# Patient Record
Sex: Female | Born: 1975 | Marital: Married | Smoking: Never smoker
Health system: Southern US, Community
[De-identification: ages and names within clinical notes are randomized; demographics above are authoritative.]

## PROBLEM LIST (undated history)

## (undated) DIAGNOSIS — R569 Unspecified convulsions: Secondary | ICD-10-CM

## (undated) DIAGNOSIS — E288 Other ovarian dysfunction: Secondary | ICD-10-CM

## (undated) DIAGNOSIS — J45909 Unspecified asthma, uncomplicated: Secondary | ICD-10-CM

## (undated) DIAGNOSIS — Z8619 Personal history of other infectious and parasitic diseases: Secondary | ICD-10-CM

## (undated) DIAGNOSIS — D649 Anemia, unspecified: Secondary | ICD-10-CM

## (undated) DIAGNOSIS — U071 COVID-19: Secondary | ICD-10-CM

## (undated) DIAGNOSIS — E2839 Other primary ovarian failure: Secondary | ICD-10-CM

## (undated) DIAGNOSIS — E785 Hyperlipidemia, unspecified: Secondary | ICD-10-CM

## (undated) DIAGNOSIS — T7840XA Allergy, unspecified, initial encounter: Secondary | ICD-10-CM

## (undated) DIAGNOSIS — F329 Major depressive disorder, single episode, unspecified: Secondary | ICD-10-CM

## (undated) DIAGNOSIS — F32A Depression, unspecified: Secondary | ICD-10-CM

## (undated) DIAGNOSIS — E063 Autoimmune thyroiditis: Secondary | ICD-10-CM

## (undated) DIAGNOSIS — Z87440 Personal history of urinary (tract) infections: Secondary | ICD-10-CM

## (undated) DIAGNOSIS — F988 Other specified behavioral and emotional disorders with onset usually occurring in childhood and adolescence: Secondary | ICD-10-CM

## (undated) HISTORY — DX: Other ovarian dysfunction: E28.8

## (undated) HISTORY — PX: DILATION AND CURETTAGE OF UTERUS: SHX78

## (undated) HISTORY — DX: Other specified behavioral and emotional disorders with onset usually occurring in childhood and adolescence: F98.8

## (undated) HISTORY — PX: MYOMECTOMY: SHX85

## (undated) HISTORY — DX: COVID-19: U07.1

## (undated) HISTORY — DX: Personal history of urinary (tract) infections: Z87.440

## (undated) HISTORY — DX: Unspecified asthma, uncomplicated: J45.909

## (undated) HISTORY — DX: Autoimmune thyroiditis: E06.3

## (undated) HISTORY — DX: Other primary ovarian failure: E28.39

## (undated) HISTORY — DX: Unspecified convulsions: R56.9

## (undated) HISTORY — PX: ABDOMINAL HYSTERECTOMY: SHX81

## (undated) HISTORY — DX: Allergy, unspecified, initial encounter: T78.40XA

## (undated) HISTORY — DX: Personal history of other infectious and parasitic diseases: Z86.19

## (undated) HISTORY — DX: Major depressive disorder, single episode, unspecified: F32.9

## (undated) HISTORY — DX: Hyperlipidemia, unspecified: E78.5

## (undated) HISTORY — DX: Depression, unspecified: F32.A

---

## 2015-12-17 LAB — HM PAP SMEAR

## 2016-05-26 ENCOUNTER — Ambulatory Visit: Payer: Self-pay

## 2016-12-29 DIAGNOSIS — E038 Other specified hypothyroidism: Secondary | ICD-10-CM | POA: Insufficient documentation

## 2017-04-16 ENCOUNTER — Encounter: Payer: Self-pay | Admitting: Obstetrics and Gynecology

## 2017-04-16 ENCOUNTER — Ambulatory Visit (INDEPENDENT_AMBULATORY_CARE_PROVIDER_SITE_OTHER): Payer: Managed Care, Other (non HMO) | Admitting: Obstetrics and Gynecology

## 2017-04-16 VITALS — BP 122/74 | Ht 67.0 in | Wt 154.0 lb

## 2017-04-16 DIAGNOSIS — Z1151 Encounter for screening for human papillomavirus (HPV): Secondary | ICD-10-CM | POA: Diagnosis not present

## 2017-04-16 DIAGNOSIS — N926 Irregular menstruation, unspecified: Secondary | ICD-10-CM | POA: Diagnosis not present

## 2017-04-16 DIAGNOSIS — Z01419 Encounter for gynecological examination (general) (routine) without abnormal findings: Secondary | ICD-10-CM | POA: Diagnosis not present

## 2017-04-16 DIAGNOSIS — Z113 Encounter for screening for infections with a predominantly sexual mode of transmission: Secondary | ICD-10-CM | POA: Diagnosis not present

## 2017-04-16 DIAGNOSIS — Z124 Encounter for screening for malignant neoplasm of cervix: Secondary | ICD-10-CM | POA: Diagnosis not present

## 2017-04-16 LAB — POCT URINE PREGNANCY: PREG TEST UR: NEGATIVE

## 2017-04-16 NOTE — Progress Notes (Signed)
Chief Complaint  Patient presents with  . Annual Exam     HPI:      [name] is a 41 y.o. No obstetric history on file. who LMP was Patient's last menstrual period was 03/09/2017., presents today for her annual examination.  Her menses are regular every 28-30 days, lasting 7 days.  Dysmenorrhea mild, occurring first 1-2 days of flow. She does not have intermenstrual bleeding. She was 1 wk late with her 3/18 menses and is now 10 days late with this menses. She is concerned about menopause because she has never been late before. She has hypothyroid but had euthyroid labs about a month ago. She is under increased stress due to recent unemployment and getting married in 1 mo.  Sex activity: single partner, contraception - condoms most of the time.  Last Pap: 1 1/2 yr ago.  Results were: "abn, due for repeat in a yr". 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   There is a FH of breast cancer in her PGM, genetic testing unknown. There is no FH of ovarian cancer. The patient does do self-breast exams. She had a baseline mammo in [place] age 13. She was found to have an abn place on mammo but had a neg u/s.   Tobacco use: The patient denies current or previous tobacco use. Alcohol use: social drinker Exercise: moderately active  She does get adequate calcium and Vitamin D in her diet.    Past Medical History:  Diagnosis Date  . ADD (attention deficit disorder)   . Depression   . Hashimoto's disease     Past Surgical History:  Procedure Laterality Date  . MYOMECTOMY      Family History  Problem Relation Age of Onset  . Rheum arthritis Mother   . Hashimoto's thyroiditis Mother   . Hypertension Mother   . Diabetes Mellitus II Father   . Hypothyroidism Father   . Hypertension Father   . Coronary artery disease Father   . Stroke Maternal Grandmother   . Breast cancer Paternal Grandmother 81    Social History   Social History  . Marital status: Single    Spouse name: N/A    . Number of children: N/A  . Years of education: N/A   Occupational History  . Not on file.   Social History Main Topics  . Smoking status: Never Smoker  . Smokeless tobacco: Never Used  . Alcohol use No  . Drug use: No  . Sexual activity: Yes    Birth control/ protection: Condom   Other Topics Concern  . Not on file   Social History Narrative  . No narrative on file     Current Outpatient Prescriptions:  .  UNITHROID 125 MCG tablet, TAKE 1 TABLET BEFORE BREAKFAST ON EMPTY STOMACH ONCE A DAY FOR THYROID (DAW), Disp: , Rfl: 0 .  VYVANSE 50 MG capsule, Take 50 mg by mouth daily., Disp: , Rfl: 0  ROS:  Review of Systems  Constitutional: Negative for fatigue, fever and unexpected weight change.  Respiratory: Negative for cough, shortness of breath and wheezing.   Cardiovascular: Negative for chest pain, palpitations and leg swelling.  Gastrointestinal: Positive for constipation. Negative for blood in stool, diarrhea, nausea and vomiting.  Endocrine: Negative for cold intolerance, heat intolerance and polyuria.  Genitourinary: Negative for dyspareunia, dysuria, flank pain, frequency, genital sores, hematuria, menstrual problem, pelvic pain, urgency, vaginal bleeding, vaginal discharge and vaginal pain.  Musculoskeletal: Negative for back pain, joint swelling and  myalgias.  Skin: Negative for rash.  Neurological: Negative for dizziness, syncope, light-headedness, numbness and headaches.  Hematological: Negative for adenopathy.  Psychiatric/Behavioral: Negative for agitation, confusion, sleep disturbance and suicidal ideas. The patient is not nervous/anxious.      Objective: BP 122/74   Ht 5\' 7"  (1.702 m)   Wt 154 lb (69.9 kg)   LMP 03/09/2017   BMI 24.12 kg/m    Physical Exam  Constitutional: She is oriented to person, place, and time. She appears well-developed and well-nourished.  Genitourinary: Vagina normal and uterus normal. There is no rash or tenderness on the  right labia. There is no rash or tenderness on the left labia. No erythema or tenderness in the vagina. No vaginal discharge found. Right adnexum does not display mass and does not display tenderness. Left adnexum does not display mass and does not display tenderness.  Cervix exhibits discharge. Cervix does not exhibit motion tenderness or polyp. Uterus is not enlarged or tender.  Genitourinary Comments: BROWN D/C VAGINALLY  Neck: Normal range of motion. No thyromegaly present.  Cardiovascular: Normal rate, regular rhythm and normal heart sounds.   No murmur heard. Pulmonary/Chest: Effort normal and breath sounds normal. Right breast exhibits no mass, no nipple discharge, no skin change and no tenderness. Left breast exhibits no mass, no nipple discharge, no skin change and no tenderness.  Abdominal: Soft. There is no tenderness. There is no guarding.  Musculoskeletal: Normal range of motion.  Neurological: She is alert and oriented to person, place, and time. No cranial nerve deficit.  Psychiatric: She has a normal mood and affect. Her behavior is normal.  Vitals reviewed.   Results: Results for orders placed or performed in visit on 04/16/17 (from the past 24 hour(s))  POCT urine pregnancy     Status: Normal   Collection Time: 04/16/17 11:34 AM  Result Value Ref Range   Preg Test, Ur Negative Negative    Assessment/Plan: Encounter for annual routine gynecological examination  Cervical cancer screening - Hx of abn paps. Will call pt with results. - Plan: IGP,CtNgTv,Apt HPV,rfx16/18,45  Screening for HPV (human papillomavirus) - Plan: IGP,CtNgTv,Apt HPV,rfx16/18,45  Screening for STD (sexually transmitted disease) - Plan: IGP,CtNgTv,Apt HPV,rfx16/18,45  Late menses - Neg UPT. Question if related to stress since euthyroid. Pt to follow sx and f/u if menses cont to be irreg. Discussed causes of irreg cycles.  - Plan: POCT urine pregnancy             GYN counsel mammography screening,  menopause, adequate intake of calcium and vitamin D     F/U  Return in about 1 year (around 04/16/2018).  Wilma Michaelson B. Freemon Binford, PA-C 04/16/2017 11:34 AM

## 2017-04-20 ENCOUNTER — Telehealth: Payer: Self-pay | Admitting: Obstetrics and Gynecology

## 2017-04-20 LAB — IGP,CTNGTV,APT HPV,RFX16/18,45
CHLAMYDIA, NUC. ACID AMP: NEGATIVE
Gonococcus, Nuc. Acid Amp: NEGATIVE
HPV APTIMA: NEGATIVE
PAP SMEAR COMMENT: 0
Trich vag by NAA: NEGATIVE

## 2017-04-20 NOTE — Telephone Encounter (Signed)
LM with neg pap results. F/u prn.

## 2018-09-02 ENCOUNTER — Encounter: Payer: Self-pay | Admitting: Internal Medicine

## 2018-09-02 ENCOUNTER — Ambulatory Visit (INDEPENDENT_AMBULATORY_CARE_PROVIDER_SITE_OTHER): Payer: BLUE CROSS/BLUE SHIELD | Admitting: Internal Medicine

## 2018-09-02 VITALS — BP 128/90 | HR 101 | Temp 98.4°F | Resp 15 | Ht 67.0 in | Wt 162.5 lb

## 2018-09-02 DIAGNOSIS — E049 Nontoxic goiter, unspecified: Secondary | ICD-10-CM

## 2018-09-02 DIAGNOSIS — J4520 Mild intermittent asthma, uncomplicated: Secondary | ICD-10-CM | POA: Diagnosis not present

## 2018-09-02 DIAGNOSIS — E063 Autoimmune thyroiditis: Secondary | ICD-10-CM | POA: Diagnosis not present

## 2018-09-02 DIAGNOSIS — E2839 Other primary ovarian failure: Secondary | ICD-10-CM | POA: Insufficient documentation

## 2018-09-02 DIAGNOSIS — E559 Vitamin D deficiency, unspecified: Secondary | ICD-10-CM

## 2018-09-02 DIAGNOSIS — Z1389 Encounter for screening for other disorder: Secondary | ICD-10-CM

## 2018-09-02 DIAGNOSIS — E039 Hypothyroidism, unspecified: Secondary | ICD-10-CM | POA: Insufficient documentation

## 2018-09-02 DIAGNOSIS — Z23 Encounter for immunization: Secondary | ICD-10-CM

## 2018-09-02 DIAGNOSIS — M79645 Pain in left finger(s): Secondary | ICD-10-CM | POA: Insufficient documentation

## 2018-09-02 DIAGNOSIS — J45909 Unspecified asthma, uncomplicated: Secondary | ICD-10-CM | POA: Insufficient documentation

## 2018-09-02 DIAGNOSIS — F419 Anxiety disorder, unspecified: Secondary | ICD-10-CM

## 2018-09-02 DIAGNOSIS — E785 Hyperlipidemia, unspecified: Secondary | ICD-10-CM | POA: Insufficient documentation

## 2018-09-02 DIAGNOSIS — T7840XA Allergy, unspecified, initial encounter: Secondary | ICD-10-CM | POA: Insufficient documentation

## 2018-09-02 DIAGNOSIS — F329 Major depressive disorder, single episode, unspecified: Secondary | ICD-10-CM

## 2018-09-02 DIAGNOSIS — F32A Depression, unspecified: Secondary | ICD-10-CM | POA: Insufficient documentation

## 2018-09-02 DIAGNOSIS — T7840XD Allergy, unspecified, subsequent encounter: Secondary | ICD-10-CM

## 2018-09-02 DIAGNOSIS — Z1231 Encounter for screening mammogram for malignant neoplasm of breast: Secondary | ICD-10-CM | POA: Diagnosis not present

## 2018-09-02 DIAGNOSIS — Z0000 Encounter for general adult medical examination without abnormal findings: Secondary | ICD-10-CM

## 2018-09-02 DIAGNOSIS — Z1159 Encounter for screening for other viral diseases: Secondary | ICD-10-CM

## 2018-09-02 DIAGNOSIS — E663 Overweight: Secondary | ICD-10-CM

## 2018-09-02 DIAGNOSIS — F909 Attention-deficit hyperactivity disorder, unspecified type: Secondary | ICD-10-CM

## 2018-09-02 DIAGNOSIS — Z0184 Encounter for antibody response examination: Secondary | ICD-10-CM

## 2018-09-02 DIAGNOSIS — E288 Other ovarian dysfunction: Secondary | ICD-10-CM

## 2018-09-02 HISTORY — DX: Hyperlipidemia, unspecified: E78.5

## 2018-09-02 HISTORY — DX: Pain in left finger(s): M79.645

## 2018-09-02 HISTORY — DX: Anxiety disorder, unspecified: F41.9

## 2018-09-02 HISTORY — DX: Depression, unspecified: F32.A

## 2018-09-02 MED ORDER — MONTELUKAST SODIUM 10 MG PO TABS: 10.0000 mg | ORAL_TABLET | Freq: Every day | ORAL | 3 refills | Status: DC

## 2018-09-02 NOTE — Patient Instructions (Addendum)
Regular zyrtec  Increase sleep 7-8 hours at night  Use cool cloth to eyes   Tdap/DTaP Vaccine (Diphtheria, Tetanus, and Pertussis): What You Need to Know 1. Why get vaccinated? Diphtheria, tetanus, and pertussis are serious diseases caused by bacteria. Diphtheria and pertussis are spread from person to person. Tetanus enters the body through cuts or wounds. DIPHTHERIA causes a thick covering in the back of the throat.  It can lead to breathing problems, paralysis, heart failure, and even death.  TETANUS (Lockjaw) causes painful tightening of the muscles, usually all over the body.  It can lead to "locking" of the jaw so the victim cannot open his mouth or swallow. Tetanus leads to death in up to 2 out of 10 cases.  PERTUSSIS (Whooping Cough) causes coughing spells so bad that it is hard for infants to eat, drink, or breathe. These spells can last for weeks.  It can lead to pneumonia, seizures (jerking and staring spells), brain damage, and death.  Diphtheria, tetanus, and pertussis vaccine (DTaP) can help prevent these diseases. Most children who are vaccinated with DTaP will be protected throughout childhood. Many more children would get these diseases if we stopped vaccinating. DTaP is a safer version of an older vaccine called DTP. DTP is no longer used in the [place]. 2. Who should get DTaP vaccine and when? Children should get 5 doses of DTaP vaccine, one dose at each of the following ages:  2 months  4 months  6 months  15-18 months  4-6 years  DTaP may be given at the same time as other vaccines. 3. Some children should not get DTaP vaccine or should wait  Children with minor illnesses, such as a cold, may be vaccinated. But children who are moderately or severely ill should usually wait until they recover before getting DTaP vaccine.  Any child who had a life-threatening allergic reaction after a dose of DTaP should not get another dose.  Any child who suffered  a brain or nervous system disease within 7 days after a dose of DTaP should not get another dose.  Talk with your doctor if your child: ? had a seizure or collapsed after a dose of DTaP, ? cried non-stop for 3 hours or more after a dose of DTaP, ? had a fever over 105F after a dose of DTaP. Ask your doctor for more information. Some of these children should not get another dose of pertussis vaccine, but may get a vaccine without pertussis, called DT. 4. Older children and adults DTaP is not licensed for adolescents, adults, or children 41 years of age and older. But older people still need protection. A vaccine called Tdap is similar to DTaP. A single dose of Tdap is recommended for people 11 through 40 years of age. Another vaccine, called Td, protects against tetanus and diphtheria, but not pertussis. It is recommended every 10 years. There are separate Vaccine Information Statements for these vaccines. 5. What are the risks from DTaP vaccine? Getting diphtheria, tetanus, or pertussis disease is much riskier than getting DTaP vaccine. However, a vaccine, like any medicine, is capable of causing serious problems, such as severe allergic reactions. The risk of DTaP vaccine causing serious harm, or death, is extremely small. Mild problems (common)  Fever (up to about 1 child in 4)  Redness or swelling where the shot was given (up to about 1 child in 4)  Soreness or tenderness where the shot was given (up to about 1 child in 4)  These problems occur more often after the 4th and 5th doses of the DTaP series than after earlier doses. Sometimes the 4th or 5th dose of DTaP vaccine is followed by swelling of the entire arm or leg in which the shot was given, lasting 1-7 days (up to about 1 child in 41). Other mild problems include:  Fussiness (up to about 1 child in 3)  Tiredness or poor appetite (up to about 1 child in 10)  Vomiting (up to about 1 child in 11) These problems generally occur 1-3  days after the shot. Moderate problems (uncommon)  Seizure (jerking or staring) (about 1 child out of 14,000)  Non-stop crying, for 3 hours or more (up to about 1 child out of 1,000)  High fever, over 105F (about 1 child out of 16,000) Severe problems (very rare)  Serious allergic reaction (less than 1 out of a million doses)  Several other severe problems have been reported after DTaP vaccine. These include: ? Long-term seizures, coma, or lowered consciousness ? Permanent brain damage. These are so rare it is hard to tell if they are caused by the vaccine. Controlling fever is especially important for children who have had seizures, for any reason. It is also important if another family member has had seizures. You can reduce fever and pain by giving your child an aspirin-free pain reliever when the shot is given, and for the next 24 hours, following the package instructions. 6. What if there is a serious reaction? What should I look for? Look for anything that concerns you, such as signs of a severe allergic reaction, very high fever, or behavior changes. Signs of a severe allergic reaction can include hives, swelling of the face and throat, difficulty breathing, a fast heartbeat, dizziness, and weakness. These would start a few minutes to a few hours after the vaccination. What should I do?  If you think it is a severe allergic reaction or other emergency that can't wait, call 9-1-1 or get the person to the nearest hospital. Otherwise, call your doctor.  Afterward, the reaction should be reported to the Vaccine Adverse Event Reporting System (VAERS). Your doctor might file this report, or you can do it yourself through the VAERS web site at www.vaers.SamedayNews.es, or by calling 253-216-6380. ? VAERS is only for reporting reactions. They do not give medical advice. 7. The National Vaccine Injury Compensation Program The [employer] Vaccine Injury Compensation Program (VICP) is a federal  program that was created to compensate people who may have been injured by certain vaccines. Persons who believe they may have been injured by a vaccine can learn about the program and about filing a claim by calling 769 141 7233 or visiting the [place] website at GoldCloset.com.ee. 8. How can I learn more?  Ask your doctor.  Call your local or state health department.  Contact the Centers for Disease Control and Prevention (CDC): ? Call (506)480-7057 (1-800-CDC-INFO) or ? Visit CDC's website at http://hunter.com/ CDC DTaP Vaccine (Diphtheria, Tetanus, and Pertussis) VIS (03/26/06) This information is not intended to replace advice given to you by your health care provider. Make sure you discuss any questions you have with your health care provider. Document Released: 08/24/2006 Document Revised: 07/17/2016 Document Reviewed: 07/17/2016 Elsevier Interactive Patient Education  2017 [place] A goiter is an enlarged thyroid gland. The thyroid gland is located in the lower front of the neck. The gland produces hormones that regulate mood, body temperature, pulse rate, and digestion. Most goiters are painless and  are not a cause for serious concern. Goiters and conditions that cause goiters can be treated, if necessary. What are the causes? Causes of this condition include:  Diseases that attack healthy cells in your body (autoimmune diseases) and affect your thyroid function, such as: ? Graves disease. This causes too much thyroid hormone to be produced and it makes your thyroid overly active (hyperthyroidism). ? Hashimoto disease. This type of inflammation of the thyroid (thyroiditis) causes too little thyroid hormone to be produced and it makes your thyroid not active enough (hypothyroidism).  Other conditions that cause thyroiditis.  Nodular goiter. This means that there are one or more small growths on your thyroid. These can create too much thyroid  hormone.  Pregnancy.  Thyroid cancer. This is rare.  Certain medicines.  Radiation exposure.  Iodine deficiency.  In some cases, the cause may not be known (idiopathic). What increases the risk? This condition is more likely to develop in:  People who have a family history of goiter.  Women.  People who do not get enough iodine in their diet.  People who are older than 61.  People who smoke tobacco.  What are the signs or symptoms? Common symptoms of this condition include:  Swelling in the lower part of the neck. This swelling can range from a very small bump to a large lump.  A tight feeling in the throat.  A hoarse voice.  Other symptoms include:  Coughing.  Wheezing.  Difficulty swallowing.  Difficulty breathing.  Bulging neck veins.  Dizziness.  In some cases, there are no symptoms and thyroid hormone levels may be normal. When a goiter is the result of hyperthyroidism, symptoms may also include:  Nervousness or restlessness.  Inability to tolerate heat.  Unexplained weight loss.  Diarrhea.  Change in the texture of hair or skin.  Changes in heart beat, such as skipped beats, extra beats, or a rapid heart rate.  Loss of menstruation.  Shaky hands.  Increased appetite.  Sleep problems.  When a goiter is the result of hypothyroidism, symptoms may also include:  Feeling like you have no energy (lethargy).  Inability to tolerate cold.  Weight gain that is not explained by a change in diet or exercise habits.  Dry skin.  Coarse hair.  Menstrual irregularity.  Constipation.  Sadness or depression.  How is this diagnosed? This condition may be diagnosed with a medical history and physical exam. You may also have other tests, including:  Blood tests to check thyroid function.  Imaging tests, such as: ? Ultrasonography. ? CT scan. ? MRI. ? Thyroid scan. You will be given a safe radioactive injection, then images will be  taken of your thyroid.  Tissue sample (biopsy) of the goiter or any nodules. This checks to see if the goiter or nodules are cancerous.  How is this treated? Treatment for this condition depends on the cause. Treatment may include:  Medicines to control your thyroid.  Anti-inflammatory or steroid medicines, if inflammation is the cause.  Iodine supplements or changes in diet, if the goiter is caused by iodine deficiency.  Radiation therapy.  Surgery to remove your thyroid.  In some cases, no treatment is necessary, and your health care provider will monitor your condition at regular checkups. Follow these instructions at home:  Follow recommendations from your health care provider for any changes to your diet.  Take over-the-counter and prescription medicines only as told by your health care provider.  Do not use any tobacco products, including  cigarettes, chewing tobacco, or e-cigarettes. If you need help quitting, ask your health care provider.  Keep all follow-up appointments as told by your health care provider. This is important. Contact a health care provider if:  Your symptoms do not get better with treatment. Get help right away if:  You develop sudden, unexplained confusion or other mental changes.  You have nausea, vomiting, or diarrhea.  You develop a fever.  Your skin or the whites of your eyes appear yellow (jaundice).  You develop chest pain.  You have trouble breathing or swallowing.  You suddenly become very weak.  You experience extreme restlessness. This information is not intended to replace advice given to you by your health care provider. Make sure you discuss any questions you have with your health care provider. Document Released: 04/16/2010 Document Revised: 05/16/2016 Document Reviewed: 10/23/2014 Elsevier Interactive Patient Education  2018 [employer].  Insomnia Insomnia is a sleep disorder that makes it difficult to fall asleep or to  stay asleep. Insomnia can cause tiredness (fatigue), low energy, difficulty concentrating, mood swings, and poor performance at work or school. There are three different ways to classify insomnia:  Difficulty falling asleep.  Difficulty staying asleep.  Waking up too early in the morning.  Any type of insomnia can be long-term (chronic) or short-term (acute). Both are common. Short-term insomnia usually lasts for three months or less. Chronic insomnia occurs at least three times a week for longer than three months. What are the causes? Insomnia may be caused by another condition, situation, or substance, such as:  Anxiety.  Certain medicines.  Gastroesophageal reflux disease (GERD) or other gastrointestinal conditions.  Asthma or other breathing conditions.  Restless legs syndrome, sleep apnea, or other sleep disorders.  Chronic pain.  Menopause. This may include hot flashes.  Stroke.  Abuse of alcohol, tobacco, or illegal drugs.  Depression.  Caffeine.  Neurological disorders, such as Alzheimer disease.  An overactive thyroid (hyperthyroidism).  The cause of insomnia may not be known. What increases the risk? Risk factors for insomnia include:  Gender. Women are more commonly affected than men.  Age. Insomnia is more common as you get older.  Stress. This may involve your professional or personal life.  Income. Insomnia is more common in people with lower income.  Lack of exercise.  Irregular work schedule or night shifts.  Traveling between different time zones.  What are the signs or symptoms? If you have insomnia, trouble falling asleep or trouble staying asleep is the main symptom. This may lead to other symptoms, such as:  Feeling fatigued.  Feeling nervous about going to sleep.  Not feeling rested in the morning.  Having trouble concentrating.  Feeling irritable, anxious, or depressed.  How is this treated? Treatment for insomnia depends  on the cause. If your insomnia is caused by an underlying condition, treatment will focus on addressing the condition. Treatment may also include:  Medicines to help you sleep.  Counseling or therapy.  Lifestyle adjustments.  Follow these instructions at home:  Take medicines only as directed by your health care provider.  Keep regular sleeping and waking hours. Avoid naps.  Keep a sleep diary to help you and your health care provider figure out what could be causing your insomnia. Include: ? When you sleep. ? When you wake up during the night. ? How well you sleep. ? How rested you feel the next day. ? Any side effects of medicines you are taking. ? What you eat and drink.  Make your bedroom a comfortable place where it is easy to fall asleep: ? Put up shades or special blackout curtains to block light from outside. ? Use a white noise machine to block noise. ? Keep the temperature cool.  Exercise regularly as directed by your health care provider. Avoid exercising right before bedtime.  Use relaxation techniques to manage stress. Ask your health care provider to suggest some techniques that may work well for you. These may include: ? Breathing exercises. ? Routines to release muscle tension. ? Visualizing peaceful scenes.  Cut back on alcohol, caffeinated beverages, and cigarettes, especially close to bedtime. These can disrupt your sleep.  Do not overeat or eat spicy foods right before bedtime. This can lead to digestive discomfort that can make it hard for you to sleep.  Limit screen use before bedtime. This includes: ? Watching TV. ? Using your smartphone, tablet, and computer.  Stick to a routine. This can help you fall asleep faster. Try to do a quiet activity, brush your teeth, and go to bed at the same time each night.  Get out of bed if you are still awake after 15 minutes of trying to sleep. Keep the lights down, but try reading or doing a quiet activity. When  you feel sleepy, go back to bed.  Make sure that you drive carefully. Avoid driving if you feel very sleepy.  Keep all follow-up appointments as directed by your health care provider. This is important. Contact a health care provider if:  You are tired throughout the day or have trouble in your daily routine due to sleepiness.  You continue to have sleep problems or your sleep problems get worse. Get help right away if:  You have serious thoughts about hurting yourself or someone else. This information is not intended to replace advice given to you by your health care provider. Make sure you discuss any questions you have with your health care provider. Document Released: 10/24/2000 Document Revised: 03/28/2016 Document Reviewed: 07/28/2014 Elsevier Interactive Patient Education  [employer].

## 2018-09-06 ENCOUNTER — Telehealth: Payer: Self-pay | Admitting: Radiology

## 2018-09-06 NOTE — Telephone Encounter (Signed)
Pt coming in for labs tomorrow, please place future orders. Thank you.  

## 2018-09-07 ENCOUNTER — Encounter: Payer: Self-pay | Admitting: Internal Medicine

## 2018-09-07 ENCOUNTER — Other Ambulatory Visit (INDEPENDENT_AMBULATORY_CARE_PROVIDER_SITE_OTHER): Payer: BLUE CROSS/BLUE SHIELD

## 2018-09-07 DIAGNOSIS — E559 Vitamin D deficiency, unspecified: Secondary | ICD-10-CM

## 2018-09-07 DIAGNOSIS — F419 Anxiety disorder, unspecified: Secondary | ICD-10-CM

## 2018-09-07 DIAGNOSIS — E049 Nontoxic goiter, unspecified: Secondary | ICD-10-CM | POA: Diagnosis not present

## 2018-09-07 DIAGNOSIS — F329 Major depressive disorder, single episode, unspecified: Secondary | ICD-10-CM

## 2018-09-07 DIAGNOSIS — E785 Hyperlipidemia, unspecified: Secondary | ICD-10-CM | POA: Diagnosis not present

## 2018-09-07 DIAGNOSIS — Z1159 Encounter for screening for other viral diseases: Secondary | ICD-10-CM

## 2018-09-07 DIAGNOSIS — E063 Autoimmune thyroiditis: Secondary | ICD-10-CM

## 2018-09-07 DIAGNOSIS — Z0000 Encounter for general adult medical examination without abnormal findings: Secondary | ICD-10-CM | POA: Diagnosis not present

## 2018-09-07 DIAGNOSIS — Z0184 Encounter for antibody response examination: Secondary | ICD-10-CM

## 2018-09-07 DIAGNOSIS — F32A Depression, unspecified: Secondary | ICD-10-CM

## 2018-09-07 DIAGNOSIS — Z1389 Encounter for screening for other disorder: Secondary | ICD-10-CM

## 2018-09-07 LAB — CBC WITH DIFFERENTIAL/PLATELET
Basophils Absolute: 0 10*3/uL (ref 0.0–0.1)
Basophils Relative: 0.8 % (ref 0.0–3.0)
EOS PCT: 3.1 % (ref 0.0–5.0)
Eosinophils Absolute: 0.1 10*3/uL (ref 0.0–0.7)
HCT: 37.7 % (ref 36.0–46.0)
HEMOGLOBIN: 13 g/dL (ref 12.0–15.0)
Lymphocytes Relative: 26 % (ref 12.0–46.0)
Lymphs Abs: 1.1 10*3/uL (ref 0.7–4.0)
MCHC: 34.5 g/dL (ref 30.0–36.0)
MCV: 90.2 fl (ref 78.0–100.0)
MONO ABS: 0.4 10*3/uL (ref 0.1–1.0)
Monocytes Relative: 9.5 % (ref 3.0–12.0)
Neutro Abs: 2.5 10*3/uL (ref 1.4–7.7)
Neutrophils Relative %: 60.6 % (ref 43.0–77.0)
Platelets: 390 10*3/uL (ref 150.0–400.0)
RBC: 4.18 Mil/uL (ref 3.87–5.11)
RDW: 13.3 % (ref 11.5–15.5)
WBC: 4.2 10*3/uL (ref 4.0–10.5)

## 2018-09-07 LAB — VITAMIN D 25 HYDROXY (VIT D DEFICIENCY, FRACTURES): VITD: 39.17 ng/mL (ref 30.00–100.00)

## 2018-09-07 LAB — COMPREHENSIVE METABOLIC PANEL
ALT: 13 U/L (ref 0–35)
AST: 16 U/L (ref 0–37)
Albumin: 4.3 g/dL (ref 3.5–5.2)
Alkaline Phosphatase: 41 U/L (ref 39–117)
BILIRUBIN TOTAL: 0.5 mg/dL (ref 0.2–1.2)
BUN: 14 mg/dL (ref 6–23)
CO2: 30 meq/L (ref 19–32)
CREATININE: 0.88 mg/dL (ref 0.40–1.20)
Calcium: 9 mg/dL (ref 8.4–10.5)
Chloride: 102 mEq/L (ref 96–112)
GFR: 75.43 mL/min (ref >60.00)
GLUCOSE: 89 mg/dL (ref 70–99)
Potassium: 4 mEq/L (ref 3.5–5.1)
SODIUM: 137 meq/L (ref 135–145)
TOTAL PROTEIN: 6.7 g/dL (ref 6.0–8.3)

## 2018-09-07 LAB — LIPID PANEL
Cholesterol: 202 mg/dL — ABNORMAL HIGH (ref 0–200)
HDL: 50.6 mg/dL (ref >39.00)
LDL Cholesterol: 132 mg/dL — ABNORMAL HIGH (ref 0–99)
NONHDL: 151.41
Total CHOL/HDL Ratio: 4
Triglycerides: 96 mg/dL (ref 0.0–149.0)
VLDL: 19.2 mg/dL (ref 0.0–40.0)

## 2018-09-07 LAB — TSH: TSH: 0.8 u[IU]/mL (ref 0.35–4.50)

## 2018-09-07 LAB — T4, FREE: Free T4: 1 ng/dL (ref 0.60–1.60)

## 2018-09-07 LAB — T3, FREE: T3 FREE: 3.3 pg/mL (ref 2.3–4.2)

## 2018-09-07 NOTE — Addendum Note (Signed)
Addended by: Leeanne Rio on: 09/07/2018 08:59 AM   Modules accepted: Orders

## 2018-09-07 NOTE — Progress Notes (Addendum)
Chief Complaint  Patient presents with  . Establish Care   New patient  1. C/o left thumb pain c/w dequervins tenosynovitis seeing [facility]  2. ADHD, anxiety and depression she is tearful today work is stressful she does not feel her ADHD meds are working she is on prozac 40 mg qd, vyvanse 40 mg qd Dextraoamphetamine ER (10 mg) taking 2 in am and 1-2 pills in pm so 20-40 mg qd f/u Dr. Harlene Ramus in [place] next appt 09/20/18. 3. Asthma newly dx'ed 05/28/18 has prn albuterol inhaler  4. C/o weight gain and wants to get her thyroid checked as she feels she has goiter and would like referral to [facility] endocrine. On adipex 37.5 x few weeks by Dr. Toy Care psychiatry  5. Abnormal menses having cycles irregularly Dr. Kerin Perna is f/u [facility] was 29.   6. H/o HLD  7. Intermittently elevated blood pressure disc avoid Zyrtec D  8. She would like flu shot today    Review of Systems  Constitutional: Negative for weight loss.  HENT: Negative for hearing loss.   Eyes: Negative for blurred vision.  Respiratory: Negative for shortness of breath.   Cardiovascular: Negative for chest pain.  Gastrointestinal: Negative for abdominal pain.  Musculoskeletal: Positive for joint pain.  Skin: Negative for rash.  Neurological: Negative for headaches.  Psychiatric/Behavioral: Positive for depression. The patient is nervous/anxious.        ADHD uncontrolled     Past Medical History:  Diagnosis Date  . ADD (attention deficit disorder)   . Depression   . Hashimoto's disease    Past Surgical History:  Procedure Laterality Date  . MYOMECTOMY     Family History  Problem Relation Age of Onset  . Rheum arthritis Mother   . Hashimoto's thyroiditis Mother   . Hypertension Mother   . Diabetes Mellitus II Father   . Hypothyroidism Father   . Hypertension Father   . Coronary artery disease Father   . Stroke Maternal Grandmother   . Breast cancer Paternal Grandmother 60   Social History   Socioeconomic History  .  Marital status: Single    Spouse name: Not on file  . Number of children: Not on file  . Years of education: Not on file  . Highest education level: Not on file  Occupational History  . Not on file  Social Needs  . Financial resource strain: Not on file  . Food insecurity:    Worry: Not on file    Inability: Not on file  . Transportation needs:    Medical: Not on file    Non-medical: Not on file  Tobacco Use  . Smoking status: Never Smoker  . Smokeless tobacco: Never Used  Substance and Sexual Activity  . Alcohol use: No  . Drug use: No  . Sexual activity: Yes    Birth control/protection: Condom  Lifestyle  . Physical activity:    Days per week: Not on file    Minutes per session: Not on file  . Stress: Not on file  Relationships  . Social connections:    Talks on phone: Not on file    Gets together: Not on file    Attends religious service: Not on file    Active member of club or organization: Not on file    Attends meetings of clubs or organizations: Not on file    Relationship status: Not on file  . Intimate partner violence:    Fear of current or ex partner: Not on  file    Emotionally abused: Not on file    Physically abused: Not on file    Forced sexual activity: Not on file  Other Topics Concern  . Not on file  Social History Narrative  . Not on file   Current Meds  Medication Sig  . albuterol (PROVENTIL HFA;VENTOLIN HFA) 108 (90 Base) MCG/ACT inhaler   . azelastine (ASTELIN) 0.1 % nasal spray TWO SPRAYS TWICE A DAY AS NEEDED FOR ALLERGIES  . cetirizine-pseudoephedrine (ZYRTEC-D) 5-120 MG tablet Take 1 tablet by mouth daily.  Marland Kitchen estradiol (VIVELLE-DOT) 0.1 MG/24HR patch APPLY TO SKIN AND CHANGE TWICE A WEEK  . FLUoxetine (PROZAC) 40 MG capsule Take 1 tablet once a day  . phentermine 37.5 MG capsule Take by mouth daily.  Marland Kitchen UNITHROID 125 MCG tablet TAKE 1 TABLET BEFORE BREAKFAST ON EMPTY STOMACH ONCE A DAY FOR THYROID (DAW)  . VYVANSE 50 MG capsule Take 50 mg  by mouth daily.   Allergies  Allergen Reactions  . Wellbutrin [Bupropion]     Seizures    . Latex Other (See Comments)    Itching, sneezing  . Other Other (See Comments)    unknown   No results found for this or any previous visit (from the past 2160 hour(s)). Objective  Body mass index is 25.45 kg/m. Wt Readings from Last 3 Encounters:  09/02/18 162 lb 8 oz (73.7 kg)  04/16/17 154 lb (69.9 kg)   Temp Readings from Last 3 Encounters:  09/02/18 98.4 F (36.9 C) (Oral)   BP Readings from Last 3 Encounters:  09/02/18 128/90  04/16/17 122/74   Pulse Readings from Last 3 Encounters:  09/02/18 (!) 101    Physical Exam  Constitutional: She is oriented to person, place, and time. Vital signs are normal. She appears well-developed and well-nourished. She is cooperative.  HENT:  Head: Normocephalic and atraumatic.  Mouth/Throat: Oropharynx is clear and moist and mucous membranes are normal.  Eyes: Pupils are equal, round, and reactive to light. Conjunctivae are normal.  Cardiovascular: Normal rate, regular rhythm and normal heart sounds.  Pulmonary/Chest: Effort normal and breath sounds normal.  Neurological: She is alert and oriented to person, place, and time. Gait normal.  Skin: Skin is warm, dry and intact.  Psychiatric: She has a normal mood and affect. Her speech is normal and behavior is normal. Judgment and thought content normal. Cognition and memory are normal.  Nursing note and vitals reviewed.   Assessment   1. Asthma and allergies  2. ADHD, anxiety and depression  3. HLD  4. H/o elevated blood pressure  5. L thumb pain  6. 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 Overweight BMI 25.45  8. hashimotos thyroiditis c/w goiter  9.HM Plan   1. rec stop zyrtec D and take regular formulation  Prn Albuterol inhaler add singulair  2.  F/u with psych Dr. Toy Care 09/20/18  3.  Check fasting labs upcoming  4. Monitor BP  5. F[facility]u [facility]  6. F/u with ob/gyn and  Dr. Kerin Perna  7. On adipex 37.5 per Dr. Toy Care  8.  Referred to [facility] endocrine  US thyroid  9.  Given flu shot today and Tdap  Referred mammogram  -prior mammogram 10/03/13 normal  [facility] [place]  Never smoker   Right thyroid FNA 08/26/50 benign follicular cells, rare oncocytic metaplastic cells and histiocytes consistent with MNG (benign thyriod nodules) neg malignancy. Thyroid US 09/12/13 thyroiditis, solitary nodule right  thyroid increased in size   Provider: Dr. Olivia Mackie McLean-Scoc[facility]

## 2018-09-08 ENCOUNTER — Other Ambulatory Visit: Payer: BLUE CROSS/BLUE SHIELD

## 2018-09-08 ENCOUNTER — Encounter: Payer: Self-pay | Admitting: Internal Medicine

## 2018-09-08 DIAGNOSIS — Z1389 Encounter for screening for other disorder: Secondary | ICD-10-CM

## 2018-09-08 LAB — HEPATITIS B SURFACE ANTIBODY, QUANTITATIVE: Hepatitis B-Post: >1000 m[IU]/mL (ref >=10)

## 2018-09-08 LAB — MEASLES/MUMPS/RUBELLA IMMUNITY
Mumps IgG: 195 AU/mL
RUBELLA: 17 {index}
Rubeola IgG: >300 AU/mL

## 2018-09-09 LAB — URINALYSIS, ROUTINE W REFLEX MICROSCOPIC
Bilirubin, UA: NEGATIVE
GLUCOSE, UA: NEGATIVE
KETONES UA: NEGATIVE
Leukocytes, UA: NEGATIVE
NITRITE UA: NEGATIVE
Protein, UA: NEGATIVE
RBC, UA: NEGATIVE
SPEC GRAV UA: 1.008 (ref 1.005–1.030)
Urobilinogen, Ur: 0.2 mg/dL (ref 0.2–1.0)
pH, UA: 8 — ABNORMAL HIGH (ref 5.0–7.5)

## 2018-09-16 ENCOUNTER — Ambulatory Visit
Admission: RE | Admit: 2018-09-16 | Discharge: 2018-09-16 | Disposition: A | Discharge status: Home / Self Care | Payer: BLUE CROSS/BLUE SHIELD | Source: Ambulatory Visit | Attending: Internal Medicine | Admitting: Internal Medicine

## 2018-09-16 DIAGNOSIS — E049 Nontoxic goiter, unspecified: Secondary | ICD-10-CM

## 2018-09-16 DIAGNOSIS — E041 Nontoxic single thyroid nodule: Secondary | ICD-10-CM | POA: Insufficient documentation

## 2018-10-11 ENCOUNTER — Other Ambulatory Visit: Payer: Self-pay | Admitting: Obstetrics and Gynecology

## 2018-10-11 DIAGNOSIS — R1900 Intra-abdominal and pelvic swelling, mass and lump, unspecified site: Secondary | ICD-10-CM

## 2018-10-24 ENCOUNTER — Ambulatory Visit
Admission: RE | Admit: 2018-10-24 | Discharge: 2018-10-24 | Disposition: A | Discharge status: Home / Self Care | Payer: BLUE CROSS/BLUE SHIELD | Source: Ambulatory Visit | Attending: Obstetrics and Gynecology | Admitting: Obstetrics and Gynecology

## 2018-10-24 ENCOUNTER — Encounter: Payer: Self-pay | Admitting: Internal Medicine

## 2018-10-24 DIAGNOSIS — R1900 Intra-abdominal and pelvic swelling, mass and lump, unspecified site: Secondary | ICD-10-CM

## 2018-10-24 MED ORDER — GADOBENATE DIMEGLUMINE 529 MG/ML IV SOLN
15.0000 mL | Freq: Once | INTRAVENOUS | Status: AC | PRN
  Administered 2018-10-24: 15 mL via INTRAVENOUS

## 2018-10-26 ENCOUNTER — Telehealth: Payer: Self-pay | Admitting: *Deleted

## 2018-10-26 NOTE — Telephone Encounter (Signed)
Patient called back and is scheduled for 12/19

## 2018-10-26 NOTE — Telephone Encounter (Signed)
Called and left the patient a message to call the office back. Need to scheduled an appt for her on December 19th

## 2018-10-27 NOTE — Progress Notes (Signed)
Consult Note: Gyn-Onc  Consult was requested by Dr.Leger for the evaluation of Cynthia Burke 42 y.o. female  CC: Uterine mass, abnormal uterine bleeding  Assessment/Plan:  [name] is a 42 y.o.  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  D/D includes degenerating myoma or other neoplasia not limited to leiomyosarcoma.  Earlier this year a 3 cm degenerating mass was appreciated in the uterus and normal pelvic exams were documented until March.  At the visit today there is a mass that is fixed within the pelvis approximately 14 cm in widest dimension.  This is minimally tender.  Infrequent bleeding was noted within the vagina, no masses were palpated within the cervix.  On MRI and ultrasound imaging both adnexa appear within normal limits  Discussed with Ms Cynthia Burke and her husband that the differential diagnosis includes leiomyosarcoma.  Other possibilities are a rapidly expanding the last cystic degenerating months, or other neoplastic conditions.  Plan is for total abdominal hysterectomy bilateral salpingectomy, possible oophorectomy or lymph node dissection dependent upon the operative findings.  Nodularity is not palpated on the rectosigmoid, no adenopathy is appreciated on pelvic or radiallogic evaluation. The risks of the procedure discussed were inclusive of infection bleeding damage to surrounding structures prolonged hospitalization or reoperation.  Given the heavy vaginal bleeding, a CBC will be collected today and I discussed the strong possibility for transfusion depending on her pre-or intraoperative hematocrit.  I have also ordered a chest x-ray.  Cynthia Burke is aware that the surgeon will be Dr. Precious Haws.  Cynthia Burke will meet Dr. Gerarda Fraction prior to surgery.  HPI: [name] is a 42 y.o. gravida 0 with early ovarian failure.  Her history is notable for remote history of hysteroscopic  polypectomy with no evidence of malignancy or precancerous changes.  She is also notable for an ultrasound in early 2019 that was notable for 3 cm posterior fibroid that appeared to be degenerating.  At that visit she was informed that her ultrasound otherwise was within normal limits.  Her history is also notable for a normal pelvic examination in March 2019.  She presented in November 2019 with abnormal uterine bleeding and endometrial biopsy was attempted but could not be performed because of the uterine mass.  An ultrasound was obtained and was notable for a uterus measuring 14.2 cm in greatest dimension.  Within this there was a 11 x 10 x 11 complex cystic mass with internal echoes and thin and thick septations that appeared to be in the posterior myometrium of the uterus.  No blood flow was seen within these masses.  The adnexa were evaluated and were normal, notable was the presence of bilateral follicular cysts .  A Ca1 25 collected on October 11, 2018 returned a value of 17.  An inhibin a was 46 which is within normal limits.  MRI  December 2019 Reproductive:  -- Uterus: Measures 14.6 x 10.4 by 11.6 cm (volume = 922 cm^3). A complex cystic lesion is seen in the right posterior uterine wall, which displaces the endometrium anteriorly and to the left. This measures 11.2 x 9.6 x 10.5 cm, and shows multiple internal irregular enhancing septations. This lesion also has smooth, well-defined wall, without evidence of invasion of adjacent structures. No abnormal endometrial thickening seen. Cervix and vagina are normal appearance.   -- Right ovary: 2.4 cm simple follicular cyst noted. No adnexal mass identified.  IMPRESSION: 11 cm complex cystic mass in posterior uterine wall, which displaces  the endometrium anteriorly and to the left. Differential diagnosis includes complete cystic degeneration of a fibroid, leiomyosarcoma, and rare cystic adenoid tumor of the uterus.  Normal appearance of both  ovaries.  No adnexal mass identified  Review of Systems:  Constitutional  Feels well, ports weight gain cardiovascular  No chest pain, shortness of breath, or edema  Pulmonary  No cough or wheeze.  Gastro Intestinal  No nausea, vomitting, or diarrhoea. No bright red blood per rectum, no change in bowel movement, or constipation.  Genito Urinary  Reports urinary frequency, no urgency, dysuria.  Reports  abnormal uterine bleeding for several months musculo Skeletal  No myalgia, arthralgia, joint swelling or pain  Neurologic  No weakness, numbness, change in gait,  Psychology  No depression, high anxiety regarding the diagnosis of possible malignancy and the possibility of death   Current Meds:  Outpatient Encounter Medications as of 10/28/2018  Medication Sig  . albuterol (PROVENTIL HFA;VENTOLIN HFA) 108 (90 Base) MCG/ACT inhaler Inhale 1-2 puffs into the lungs every 6 (six) hours as needed.   Marland Kitchen azelastine (ASTELIN) 0.1 % nasal spray TWO SPRAYS TWICE A DAY AS NEEDED FOR ALLERGIES  . CALCIUM CARBONATE PO Take by mouth.  . cetirizine-pseudoephedrine (ZYRTEC-D) 5-120 MG tablet Take 1 tablet by mouth daily.  . CHOLECALCIFEROL PO Take by mouth. 1000 IU daily  . Coenzyme Q10 (COQ10 PO) Take by mouth.  . dextroamphetamine (DEXEDRINE SPANSULE) 10 MG 24 hr capsule Take 10 mg by mouth 2 (two) times daily.   Marland Kitchen estradiol (VIVELLE-DOT) 0.1 MG/24HR patch APPLY TO SKIN AND CHANGE TWICE A WEEK  . FLUoxetine (PROZAC) 40 MG capsule Take 1 tablet once a day  . montelukast (SINGULAIR) 10 MG tablet Take 1 tablet (10 mg total) by mouth at bedtime.  . phentermine 37.5 MG capsule Take by mouth daily.  Marland Kitchen UNITHROID 125 MCG tablet TAKE 1 TABLET BEFORE BREAKFAST ON EMPTY STOMACH ONCE A DAY FOR THYROID (DAW)  . VYVANSE 40 MG CHEW Chew 40 mg by mouth daily.    No facility-administered encounter medications on file as of 10/28/2018.     Allergy:  Allergies  Allergen Reactions  . Wellbutrin [Bupropion]      Seizures    . Latex Other (See Comments)    Itching, sneezing  . Other Other (See Comments)    Tree pollen      Social Hx:   Social History   Socioeconomic History  . Marital status: Single    Spouse name: Not on file  . Number of children: Not on file  . Years of education: Not on file  . Highest education level: Not on file  Occupational History  . Not on file  Social Needs  . Financial resource strain: Not on file  . Food insecurity:    Worry: Not on file    Inability: Not on file  . Transportation needs:    Medical: Not on file    Non-medical: Not on file  Tobacco Use  . Smoking status: Never Smoker  . Smokeless tobacco: Never Used  Substance and Sexual Activity  . Alcohol use: No  . Drug use: No  . Sexual activity: Yes    Birth control/protection: Condom  Lifestyle  . Physical activity:    Days per week: Not on file    Minutes per session: Not on file  . Stress: Not on file  Relationships  . Social connections:    Talks on phone: Not on file    Gets together:  Not on file    Attends religious service: Not on file    Active member of club or organization: Not on file    Attends meetings of clubs or organizations: Not on file    Relationship status: Not on file  . Intimate partner violence:    Fear of current or ex partner: Not on file    Emotionally abused: Not on file    Physically abused: Not on file    Forced sexual activity: Not on file  Other Topics Concern  . Not on file  Social History Narrative   Married    PT   No kids   Is employed as a Community education officer  Past Surgical Hx:  Past Surgical History:  Procedure Laterality Date  . DILATION AND CURETTAGE OF UTERUS     2015 and 2018   . MYOMECTOMY     2015     Past Medical Hx:  Past Medical History:  Diagnosis Date  . ADD (attention deficit disorder)   . Allergy   . Asthma   . Depression   . Hashimoto's disease   . Hashimoto's thyroiditis   . History of chicken pox   . History of  UTI   . Hyperlipidemia   . Premature ovarian insufficiency    FSH 31 Dr. Hughie Closs   . Seizures ([place])    medication induced wellbutrin    Past Gynecological History: He occurred at the age of 33 with regular menses until the diagnosis of early ovarian failure.  For the last few months there has been vaginal bleeding which at times can be very heavy No LMP recorded.  Family Hx:  Family History  Problem Relation Age of Onset  . Rheum arthritis Mother   . Hashimoto's thyroiditis Mother   . Hypertension Mother   . Hyperlipidemia Mother   . Arthritis Mother   . Asthma Mother   . Depression Mother   . Kidney disease Mother   . Miscarriages / [place] Mother   . Diabetes Mellitus II Father   . Hypothyroidism Father   . Hypertension Father   . Coronary artery disease Father   . Hyperlipidemia Father   . Arthritis Father   . COPD Father   . Depression Father   . Diabetes Father   . Heart disease Father   . Kidney disease Father   . Stroke Maternal Grandmother   . Heart disease Maternal Grandmother   . Breast cancer Paternal Grandmother 53  . Early death Paternal Grandmother   . Cancer Paternal Grandmother        breast died   . Miscarriages / Stillbirths Paternal Grandmother   . Learning disabilities Brother   . Hypertension Brother   . Depression Brother   . Alcohol abuse Maternal Grandfather   . Early death Maternal Grandfather   . Heart disease Paternal Grandfather     Vitals:  Blood pressure (!) 146/87, pulse (!) 122, temperature 98.5 F (36.9 C), temperature source Oral, resp. rate 16, height 5\' 7"  (1.702 m), weight 164 lb 3.2 oz (74.5 kg), SpO2 100 %.  Physical Exam: WD in NAD, tearful with hives Neck  Supple NROM, without any enlargements.  Lymph Node Survey No cervical supraclavicular or inguinal adenopathy Cardiovascular  Pulse normal rate, regularity and rhythm.  Lungs  Clear to auscultation bilaterally,  Good air movement.  Skin  No  rash/lesions/breakdown  Psychiatry  Alert and oriented appropriate mood affect speech and reasoning. Abdomen  Normoactive bowel sounds, abdomen soft, non-tender.  Back No CVA tenderness Genito Urinary  Vulva/vagina: Normal external female genitalia.  Blood present within the vaginal vault.  Bladder/urethra:  No lesions or masses  Vagina: Estrogenized  cervix: Deviated posteriorly, otherwise soft approximately 2-1/2 cm.  A 14 cm mass occupies the pelvis extending to bilateral pelvic sidewalls, mass is fixed, however there is no nodularity.  Rectal  Good tone, no masses no cul de sac nodularity. Pelvic mass appreciated Extremities  No bilateral cyanosis, clubbing or edema.   Janie Morning, MD, PhD 10/27/2018, 9:03 PM

## 2018-10-27 NOTE — H&P (View-Only) (Signed)
Consult Note: Gyn-Onc  Consult was requested by Dr.Leger for the evaluation of Cynthia Burke Burke 42 y.o. female  CC: Uterine mass, abnormal uterine bleeding  Assessment/Plan:  Cynthia. Cynthia Burke Burke is a 42 y.o.  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  D/D includes degenerating myoma or other neoplasia not limited to leiomyosarcoma.  Earlier this year a 3 cm degenerating mass was appreciated in the uterus and normal pelvic exams were documented until March.  At the visit today there is a mass that is fixed within the pelvis approximately 14 cm in widest dimension.  This is minimally tender.  Infrequent bleeding was noted within the vagina, no masses were palpated within the cervix.  On MRI and ultrasound imaging both adnexa appear within normal limits  Discussed with Cynthia Burke Burke and Cynthia Burke Burke that the differential diagnosis includes leiomyosarcoma.  Other possibilities are a rapidly expanding the last cystic degenerating months, or other neoplastic conditions.  Plan is for total abdominal hysterectomy bilateral salpingectomy, possible oophorectomy or lymph node dissection dependent upon the operative findings.  Nodularity is not palpated on the rectosigmoid, no adenopathy is appreciated on pelvic or radiallogic evaluation. The risks of the procedure discussed were inclusive of infection bleeding damage to surrounding structures prolonged hospitalization or reoperation.  Given the heavy vaginal bleeding, a CBC will be collected today and I discussed the strong possibility for transfusion depending on Cynthia Burke pre-or intraoperative hematocrit.  I have also ordered a chest x-ray.  Cynthia Burke Burke is aware that the surgeon will be Dr. Precious Haws.  Cynthia Burke Burke will meet Dr. Gerarda Fraction prior to surgery.  HPI: Cynthia. Cynthia Burke Burke is a 42 y.o. gravida 0 with early ovarian failure.  Cynthia Burke Burke is notable for remote Burke of hysteroscopic  polypectomy with no evidence of malignancy or precancerous changes.  She is also notable for an ultrasound in early 2019 that was notable for 3 cm posterior fibroid that appeared to be degenerating.  At that visit she was informed that Cynthia Burke ultrasound otherwise was within normal limits.  Cynthia Burke Burke is also notable for a normal pelvic examination in March 2019.  She presented in November 2019 with abnormal uterine bleeding and endometrial biopsy was attempted but could not be performed because of the uterine mass.  An ultrasound was obtained and was notable for a uterus measuring 14.2 cm in greatest dimension.  Within this there was a 11 x 10 x 11 complex cystic mass with internal echoes and thin and thick septations that appeared to be in the posterior myometrium of the uterus.  No blood flow was seen within these masses.  The adnexa were evaluated and were normal, notable was the presence of bilateral follicular cysts .  A Ca1 25 collected on October 11, 2018 returned a value of 17.  An inhibin a was 80 which is within normal limits.  MRI  December 2019 Reproductive:  -- Uterus: Measures 14.6 x 10.4 by 11.6 cm (volume = 922 cm^3). A complex cystic lesion is seen in the right posterior uterine wall, which displaces the endometrium anteriorly and to the left. This measures 11.2 x 9.6 x 10.5 cm, and shows multiple internal irregular enhancing septations. This lesion also has smooth, well-defined wall, without evidence of invasion of adjacent structures. No abnormal endometrial thickening seen. Cervix and vagina are normal appearance.   -- Right ovary: 2.4 cm simple follicular cyst noted. No adnexal mass identified.  IMPRESSION: 11 cm complex cystic mass in posterior uterine wall, which displaces  the endometrium anteriorly and to the left. Differential diagnosis includes complete cystic degeneration of a fibroid, leiomyosarcoma, and rare cystic adenoid tumor of the uterus.  Normal appearance of both  ovaries.  No adnexal mass identified  Review of Systems:  Constitutional  Feels well, ports weight gain cardiovascular  No chest pain, shortness of breath, or edema  Pulmonary  No cough or wheeze.  Gastro Intestinal  No nausea, vomitting, or diarrhoea. No bright red blood per rectum, no change in bowel movement, or constipation.  Genito Urinary  Reports urinary frequency, no urgency, dysuria.  Reports  abnormal uterine bleeding for several months musculo Skeletal  No myalgia, arthralgia, joint swelling or pain  Neurologic  No weakness, numbness, change in gait,  Psychology  No depression, high anxiety regarding the diagnosis of possible malignancy and the possibility of death   Current Meds:  Outpatient Encounter Medications as of 10/28/2018  Medication Sig  . albuterol (PROVENTIL HFA;VENTOLIN HFA) 108 (90 Base) MCG/ACT inhaler Inhale 1-2 puffs into the lungs every 6 (six) hours as needed.   Marland Kitchen azelastine (ASTELIN) 0.1 % nasal spray TWO SPRAYS TWICE A DAY AS NEEDED FOR ALLERGIES  . CALCIUM CARBONATE PO Take by mouth.  . cetirizine-pseudoephedrine (ZYRTEC-D) 5-120 MG tablet Take 1 tablet by mouth daily.  . CHOLECALCIFEROL PO Take by mouth. 1000 IU daily  . Coenzyme Q10 (COQ10 PO) Take by mouth.  . dextroamphetamine (DEXEDRINE SPANSULE) 10 MG 24 hr capsule Take 10 mg by mouth 2 (two) times daily.   Marland Kitchen estradiol (VIVELLE-DOT) 0.1 MG/24HR patch APPLY TO SKIN AND CHANGE TWICE A WEEK  . FLUoxetine (PROZAC) 40 MG capsule Take 1 tablet once a day  . montelukast (SINGULAIR) 10 MG tablet Take 1 tablet (10 mg total) by mouth at bedtime.  . phentermine 37.5 MG capsule Take by mouth daily.  Marland Kitchen UNITHROID 125 MCG tablet TAKE 1 TABLET BEFORE BREAKFAST ON EMPTY STOMACH ONCE A DAY FOR THYROID (DAW)  . VYVANSE 40 MG CHEW Chew 40 mg by mouth daily.    No facility-administered encounter medications on file as of 10/28/2018.     Allergy:  Allergies  Allergen Reactions  . Wellbutrin [Bupropion]      Seizures    . Latex Other (See Comments)    Itching, sneezing  . Other Other (See Comments)    Tree pollen      Social Hx:   Social Burke   Socioeconomic Burke  . Marital status: Single    Spouse name: Not on file  . Number of children: Not on file  . Years of education: Not on file  . Highest education level: Not on file  Occupational Burke  . Not on file  Social Needs  . Financial resource strain: Not on file  . Food insecurity:    Worry: Not on file    Inability: Not on file  . Transportation needs:    Medical: Not on file    Non-medical: Not on file  Tobacco Use  . Smoking status: Never Smoker  . Smokeless tobacco: Never Used  Substance and Sexual Activity  . Alcohol use: No  . Drug use: No  . Sexual activity: Yes    Birth control/protection: Condom  Lifestyle  . Physical activity:    Days per week: Not on file    Minutes per session: Not on file  . Stress: Not on file  Relationships  . Social connections:    Talks on phone: Not on file    Gets together:  Not on file    Attends religious service: Not on file    Active member of club or organization: Not on file    Attends meetings of clubs or organizations: Not on file    Relationship status: Not on file  . Intimate partner violence:    Fear of current or ex partner: Not on file    Emotionally abused: Not on file    Physically abused: Not on file    Forced sexual activity: Not on file  Other Topics Concern  . Not on file  Social Burke Narrative   Married    PT   No kids   Is employed as a Community education officer  Past Surgical Hx:  Past Surgical Burke:  Procedure Laterality Date  . DILATION AND CURETTAGE OF UTERUS     2015 and 2018   . MYOMECTOMY     2015     Past Medical Hx:  Past Medical Burke:  Diagnosis Date  . ADD (attention deficit disorder)   . Allergy   . Asthma   . Depression   . Hashimoto's disease   . Hashimoto's thyroiditis   . Burke of chicken pox   . Burke of  UTI   . Hyperlipidemia   . Premature ovarian insufficiency    [facility] 29 Dr. Hughie Closs   . 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  For the last few months there has been vaginal bleeding which at times can be very heavy No LMP recorded.  Family Hx:  Family Burke  Problem Relation Age of Onset  . Rheum arthritis Mother   . Hashimoto's thyroiditis Mother   . Hypertension Mother   . Hyperlipidemia Mother   . Arthritis Mother   . Asthma Mother   . Depression Mother   . Kidney disease Mother   . Miscarriages / [place] Mother   . Diabetes Mellitus II Father   . Hypothyroidism Father   . Hypertension Father   . Coronary artery disease Father   . Hyperlipidemia Father   . Arthritis Father   . COPD Father   . Depression Father   . Diabetes Father   . Heart disease Father   . Kidney disease Father   . Stroke Maternal Grandmother   . Heart disease Maternal Grandmother   . Breast cancer Paternal Grandmother 19  . Early death Paternal Grandmother   . Cancer Paternal Grandmother        breast died   . Miscarriages / Stillbirths Paternal Grandmother   . Learning disabilities Brother   . Hypertension Brother   . Depression Brother   . Alcohol abuse Maternal Grandfather   . Early death Maternal Grandfather   . Heart disease Paternal Grandfather     Vitals:  Blood pressure (!) 146/87, pulse (!) 122, temperature 98.5 F (36.9 C), temperature source Oral, resp. rate 16, height 5\' 7"  (1.702 m), weight 164 lb 3.2 oz (74.5 kg), SpO2 100 %.  Physical Exam: WD in NAD, tearful with hives Neck  Supple NROM, without any enlargements.  Lymph Node Survey No cervical supraclavicular or inguinal adenopathy Cardiovascular  Pulse normal rate, regularity and rhythm.  Lungs  Clear to auscultation bilaterally,  Good air movement.  Skin  No  rash/lesions/breakdown  Psychiatry  Alert and oriented appropriate mood affect speech and reasoning. Abdomen  Normoactive bowel sounds, abdomen soft, non-tender.  Back No CVA tenderness Genito Urinary  Vulva/vagina: Normal external female genitalia.  Blood present within the vaginal vault.  Bladder/urethra:  No lesions or masses  Vagina: Estrogenized  cervix: Deviated posteriorly, otherwise soft approximately 2-1/2 cm.  A 14 cm mass occupies the pelvis extending to bilateral pelvic sidewalls, mass is fixed, however there is no nodularity.  Rectal  Good tone, no masses no cul de sac nodularity. Pelvic mass appreciated Extremities  No bilateral cyanosis, clubbing or edema.   Janie Morning, MD, PhD 10/27/2018, 9:03 PM

## 2018-10-28 ENCOUNTER — Ambulatory Visit (HOSPITAL_COMMUNITY)
Admission: RE | Admit: 2018-10-28 | Discharge: 2018-10-28 | Disposition: A | Discharge status: Home / Self Care | Payer: BLUE CROSS/BLUE SHIELD | Source: Ambulatory Visit | Attending: Gynecologic Oncology | Admitting: Gynecologic Oncology

## 2018-10-28 ENCOUNTER — Encounter: Payer: Self-pay | Admitting: Gynecologic Oncology

## 2018-10-28 ENCOUNTER — Encounter: Payer: Self-pay | Admitting: Internal Medicine

## 2018-10-28 ENCOUNTER — Other Ambulatory Visit: Payer: Self-pay | Admitting: Internal Medicine

## 2018-10-28 ENCOUNTER — Inpatient Hospital Stay (HOSPITAL_BASED_OUTPATIENT_CLINIC_OR_DEPARTMENT_OTHER): Payer: BLUE CROSS/BLUE SHIELD | Admitting: Gynecologic Oncology

## 2018-10-28 ENCOUNTER — Inpatient Hospital Stay: Payer: BLUE CROSS/BLUE SHIELD | Attending: Gynecologic Oncology

## 2018-10-28 VITALS — BP 146/87 | HR 122 | Temp 98.5°F | Resp 16 | Ht 67.0 in | Wt 164.2 lb

## 2018-10-28 DIAGNOSIS — E039 Hypothyroidism, unspecified: Secondary | ICD-10-CM

## 2018-10-28 DIAGNOSIS — R1907 Generalized intra-abdominal and pelvic swelling, mass and lump: Secondary | ICD-10-CM

## 2018-10-28 DIAGNOSIS — N939 Abnormal uterine and vaginal bleeding, unspecified: Secondary | ICD-10-CM | POA: Insufficient documentation

## 2018-10-28 DIAGNOSIS — N858 Other specified noninflammatory disorders of uterus: Secondary | ICD-10-CM

## 2018-10-28 DIAGNOSIS — D390 Neoplasm of uncertain behavior of uterus: Secondary | ICD-10-CM | POA: Insufficient documentation

## 2018-10-28 DIAGNOSIS — E2839 Other primary ovarian failure: Secondary | ICD-10-CM | POA: Diagnosis not present

## 2018-10-28 LAB — CBC WITH DIFFERENTIAL (CANCER CENTER ONLY)
Abs Immature Granulocytes: 0.02 10*3/uL (ref 0.00–0.07)
Basophils Absolute: 0.1 10*3/uL (ref 0.0–0.1)
Basophils Relative: 1 %
EOS PCT: 1 %
Eosinophils Absolute: 0.1 10*3/uL (ref 0.0–0.5)
HEMATOCRIT: 35 % — AB (ref 36.0–46.0)
HEMOGLOBIN: 11.6 g/dL — AB (ref 12.0–15.0)
Immature Granulocytes: 0 %
LYMPHS PCT: 21 %
Lymphs Abs: 1.1 10*3/uL (ref 0.7–4.0)
MCH: 30.5 pg (ref 26.0–34.0)
MCHC: 33.1 g/dL (ref 30.0–36.0)
MCV: 92.1 fL (ref 80.0–100.0)
MONO ABS: 0.5 10*3/uL (ref 0.1–1.0)
MONOS PCT: 9 %
Neutro Abs: 3.7 10*3/uL (ref 1.7–7.7)
Neutrophils Relative %: 68 %
Platelet Count: 372 10*3/uL (ref 150–400)
RBC: 3.8 MIL/uL — ABNORMAL LOW (ref 3.87–5.11)
RDW: 13.3 % (ref 11.5–15.5)
WBC: 5.5 10*3/uL (ref 4.0–10.5)
nRBC: 0 % (ref 0.0–0.2)

## 2018-10-28 LAB — PREGNANCY, URINE: Preg Test, Ur: NEGATIVE

## 2018-10-28 LAB — BASIC METABOLIC PANEL
Anion gap: 9 (ref 5–15)
BUN: 12 mg/dL (ref 6–20)
CO2: 25 mmol/L (ref 22–32)
CREATININE: 0.93 mg/dL (ref 0.44–1.00)
Calcium: 9.9 mg/dL (ref 8.9–10.3)
Chloride: 105 mmol/L (ref 98–111)
GFR calc Af Amer: >60 mL/min (ref >60)
GFR calc non Af Amer: >60 mL/min (ref >60)
GLUCOSE: 98 mg/dL (ref 70–99)
POTASSIUM: 4 mmol/L (ref 3.5–5.1)
Sodium: 139 mmol/L (ref 135–145)

## 2018-10-28 MED ORDER — UNITHROID 125 MCG PO TABS: ORAL_TABLET | ORAL | 0 refills | Status: DC

## 2018-10-28 MED ORDER — IOHEXOL 300 MG/ML  SOLN
100.0000 mL | Freq: Once | INTRAMUSCULAR | Status: AC | PRN
  Administered 2018-10-28: 100 mL via INTRAVENOUS

## 2018-10-28 NOTE — Patient Instructions (Addendum)
You need to be at Radiology on the first floor at [facility] at 1230 pm today 10-28-18 to begin drinking contrast.  CXR at [facility] now after labs is completed.  Preparing for your Surgery  Plan for surgery on November 04, 2018 with Dr. Precious Haws at [place] will be scheduled for an exploratory laparotomy, total abdominal hysterectomy, bilateral salpingectomy, possible bilateral oophorectomy, possible staging.   Pre-operative Testing -You will receive a phone call from presurgical testing at [facility] if you have not received a call already to arrange for a pre-operative testing appointment before your surgery.  This appointment normally occurs one to two weeks before your scheduled surgery.   -Bring your insurance card, copy of an advanced directive if applicable, medication list  -At that visit, you will be asked to sign a consent for a possible blood transfusion in case a transfusion becomes necessary during surgery.  The need for a blood transfusion is rare but having consent is a necessary part of your care.     -You should not be taking blood thinners or aspirin at least ten days prior to surgery unless instructed by your surgeon.  -As part of our enhanced surgical recovery pathway, you may be advised to drink a carbohydrate drink the morning of surgery (at least 3 hours before). If you are diabetic, this will be avoided in order to prevent elevated glucose levels.  Day Before Surgery at [facility] will be asked to take in a light diet the day before surgery.  Avoid carbonated beverages.  You will be advised to have nothing to eat or drink after midnight the evening before.    Eat a light diet the day before surgery.  Examples including soups, broths, toast, yogurt, mashed potatoes.  Things to avoid include carbonated beverages (fizzy beverages), raw fruits and raw vegetables, or beans.   If your bowels are filled with gas, your surgeon  will have difficulty visualizing your pelvic organs which increases your surgical risks.  PLAN TO PERFORM A FLEETS ENEMA THE EVENING BEFORE SURGERY AROUND 7PM TO CLEAN OUT YOUR BOWELS.  Your role in recovery Your role is to become active as soon as directed by your doctor, while still giving yourself time to heal.  Rest when you feel tired. You will be asked to do the following in order to speed your recovery:  - Cough and breathe deeply. This helps toclear and expand your lungs and can prevent pneumonia. You may be given a spirometer to practice deep breathing. A staff member will show you how to use the spirometer. - Do mild physical activity. Walking or moving your legs help your circulation and body functions return to normal. A staff member will help you when you try to walk and will provide you with simple exercises. Do not try to get up or walk alone the first time. - Actively manage your pain. Managing your pain lets you move in comfort. We will ask you to rate your pain on a scale of zero to 10. It is your responsibility to tell your doctor or nurse where and how much you hurt so your pain can be treated.  Special Considerations -If you are diabetic, you may be placed on insulin after surgery to have closer control over your blood sugars to promote healing and recovery.  This does not mean that you will be discharged on insulin.  If applicable, your oral antidiabetics will be resumed when you are  tolerating a solid diet.  -Your final pathology results from surgery should be available around one week after surgery and the results will be relayed to you when available.  -Dr. Lahoma Crocker is the Surgeon that assists your GYN Oncologist with surgery.  The next day after your surgery you will either see your GYN Oncologist, Dr. Everitt Amber, or Dr. Lahoma Crocker.  -FMLA forms can be faxed to [phone] and please allow 5-7 business days for completion.  Pain Management After  Surgery -Make sure that you have Tylenol and Ibuprofen at home to use on a regular basis after surgery for pain control. We recommend alternating the medications every hour to six hours since they work differently and are processed in the body differently for pain relief.   Blood Transfusion Information WHAT IS A BLOOD TRANSFUSION? A transfusion is the replacement of blood or some of its parts. Blood is made up of multiple cells which provide different functions.  Red blood cells carry oxygen and are used for blood loss replacement.  White blood cells fight against infection.  Platelets control bleeding.  Plasma helps clot blood.  Other blood products are available for specialized needs, such as hemophilia or other clotting disorders. BEFORE THE TRANSFUSION  Who gives blood for transfusions?   You may be able to donate blood to be used at a later date on yourself (autologous donation).  Relatives can be asked to donate blood. This is generally not any safer than if you have received blood from a stranger. The same precautions are taken to ensure safety when a relative's blood is donated.  Healthy volunteers who are fully evaluated to make sure their blood is safe. This is blood bank blood. Transfusion therapy is the safest it has ever been in the practice of medicine. Before blood is taken from a donor, a complete history is taken to make sure that person has no history of diseases nor engages in risky social behavior (examples are intravenous drug use or sexual activity with multiple partners). The donor's travel history is screened to minimize risk of transmitting infections, such as malaria. The donated blood is tested for signs of infectious diseases, such as HIV and hepatitis. The blood is then tested to be sure it is compatible with you in order to minimize the chance of a transfusion reaction. If you or a relative donates blood, this is often done in anticipation of surgery and is not  appropriate for emergency situations. It takes many days to process the donated blood. RISKS AND COMPLICATIONS Although transfusion therapy is very safe and saves many lives, the main dangers of transfusion include:   Getting an infectious disease.  Developing a transfusion reaction. This is an allergic reaction to something in the blood you were given. Every precaution is taken to prevent this. The decision to have a blood transfusion has been considered carefully by your caregiver before blood is given. Blood is not given unless the benefits outweigh the risks.

## 2018-10-29 NOTE — Patient Instructions (Signed)
Zariya Aspiazu-Owens  10/29/2018   Your procedure is scheduled on:   11-04-2018   Report to [facility] Main  Entrance,  Report to admitting at  8:00 AM    Call this number if you have problems the morning of surgery [phone]      Eat a light diet the day before surgery.  Examples including soups, broths, toast, yogurt, mashed potatoes.  Things to avoid include carbonated beverages (fizzy beverages), raw fruits and raw vegetables, or beans.   If your bowels are filled with gas, your surgeon will have difficulty visualizing your pelvic organs which increases your surgical risks.     Remember: NO SOLID FOOD AFTER MIDNIGHT THE NIGHT PRIOR TO SURGERY. NOTHING BY MOUTH EXCEPT CLEAR LIQUIDS UNTIL 3 HOURS PRIOR TO [place] SURGERY. PLEASE FINISH ENSURE DRINK PER SURGEON ORDER 3 HOURS PRIOR TO SCHEDULED SURGERY TIME WHICH NEEDS TO BE COMPLETED AT __7:30 AM___.     BRUSH YOUR TEETH MORNING OF SURGERY AND RINSE YOUR MOUTH OUT, NO CHEWING GUM CANDY OR MINTS.     Take these medicines the morning of surgery with A SIP OF WATER:   Fluoxetine (prozac),  Unithroid,  Albuterol if needed and bring with day of surgery                                   You may not have any metal on your body including hair pins and               piercings  Do not wear jewelry, make-up, lotions, powders or perfumes, deodorant              Do not wear nail polish.  Do not shave  48 hours prior to surgery.                Do not bring valuables to the hospital. [place].  Contacts, dentures or bridgework may not be worn into surgery.  Leave suitcase in the car. After surgery it may be brought to your room.      Special Instructions:   DO ONE FLEET ENEMA EVENING PRIOR TO DAY OF SURGERY               Please read over the following fact sheets you were  given: _____________________________________________________________________             [facility] - Preparing for Surgery Before surgery, you can play an important role.  Because skin is not sterile, your skin needs to be as free of germs as possible.  You can reduce the number of germs on your skin by washing with CHG (chlorahexidine gluconate) soap before surgery.  CHG is an antiseptic cleaner which kills germs and bonds with the skin to continue killing germs even after washing. Please DO NOT use if you have an allergy to CHG or antibacterial soaps.  If your skin becomes reddened/irritated stop using the CHG and inform your nurse when you arrive at Short Stay. Do not shave (including legs and underarms) for at least 48 hours prior to the first CHG shower.  You may shave your face/neck. Please follow these instructions carefully:  1.  Shower with CHG Soap the night before surgery and the  morning of Surgery.  2.  If you choose to wash your hair, wash your hair first as usual with your  normal  shampoo.  3.  After you shampoo, rinse your hair and body thoroughly to remove the  shampoo.                            4.  Use CHG as you would any other liquid soap.  You can apply chg directly  to the skin and wash                       Gently with a scrungie or clean washcloth.  5.  Apply the CHG Soap to your body ONLY FROM THE NECK DOWN.   Do not use on face/ open                           Wound or open sores. Avoid contact with eyes, ears mouth and genitals (private parts).                       Wash face,  Genitals (private parts) with your normal soap.             6.  Wash thoroughly, paying special attention to the area where your surgery  will be performed.  7.  Thoroughly rinse your body with warm water from the neck down.  8.  DO NOT shower/wash with your normal soap after using and rinsing off  the CHG Soap.             9.  Pat yourself dry with a clean towel.            10.  Wear clean  pajamas.            11.  Place clean sheets on your bed the night of your first shower and do not  sleep with pets. Day of Surgery : Do not apply any lotions/deodorants the morning of surgery.  Please wear clean clothes to the hospital/surgery center.  FAILURE TO FOLLOW THESE INSTRUCTIONS MAY RESULT IN THE CANCELLATION OF YOUR SURGERY PATIENT SIGNATURE_________________________________  NURSE SIGNATURE__________________________________  ________________________________________________________________________   Adam Phenix  An incentive spirometer is a tool that can help keep your lungs clear and active. This tool measures how well you are filling your lungs with each breath. Taking long deep breaths may help reverse or decrease the chance of developing breathing (pulmonary) problems (especially infection) following:  A long period of time when you are unable to move or be active. BEFORE THE PROCEDURE   If the spirometer includes an indicator to show your best effort, your nurse or respiratory therapist will set it to a desired goal.  If possible, sit up straight or lean slightly forward. Try not to slouch.  Hold the incentive spirometer in an upright position. INSTRUCTIONS FOR USE  1. Sit on the edge of your bed if possible, or sit up as far as you can in bed or on a chair. 2. Hold the incentive spirometer in an upright position. 3. Breathe out normally. 4. Place the mouthpiece in your mouth and seal your lips tightly around it. 5. Breathe in slowly and as deeply as possible, raising the piston or the ball toward the top of the column. 6. Hold your breath for 3-5 seconds or for as long as possible. Allow the  piston or ball to fall to the bottom of the column. 7. Remove the mouthpiece from your mouth and breathe out normally. 8. Rest for a few seconds and repeat Steps 1 through 7 at least 10 times every 1-2 hours when you are awake. Take your time and take a few normal breaths  between deep breaths. 9. The spirometer may include an indicator to show your best effort. Use the indicator as a goal to work toward during each repetition. 10. After each set of 10 deep breaths, practice coughing to be sure your lungs are clear. If you have an incision (the cut made at the time of surgery), support your incision when coughing by placing a pillow or rolled up towels firmly against it. Once you are able to get out of bed, walk around indoors and cough well. You may stop using the incentive spirometer when instructed by your caregiver.  RISKS AND COMPLICATIONS  Take your time so you do not get dizzy or light-headed.  If you are in pain, you may need to take or ask for pain medication before doing incentive spirometry. It is harder to take a deep breath if you are having pain. AFTER USE  Rest and breathe slowly and easily.  It can be helpful to keep track of a log of your progress. Your caregiver can provide you with a simple table to help with this. If you are using the spirometer at home, follow these instructions: [place] IF:   You are having difficultly using the spirometer.  You have trouble using the spirometer as often as instructed.  Your pain medication is not giving enough relief while using the spirometer.  You develop fever of 100.5 F (38.1 C) or higher. SEEK IMMEDIATE MEDICAL CARE IF:   You cough up bloody sputum that had not been present before.  You develop fever of 102 F (38.9 C) or greater.  You develop worsening pain at or near the incision site. MAKE SURE YOU:   Understand these instructions.  Will watch your condition.  Will get help right away if you are not doing well or get worse. Document Released: 03/09/2007 Document Revised: 01/19/2012 Document Reviewed: 05/10/2007 Exit[employer] Patient Information 2014 Exit[employer], [place].   ________________________________________________________________________  WHAT IS A BLOOD TRANSFUSION?  Blood Transfusion Information  A transfusion is the replacement of blood or some of its parts. Blood is made up of multiple cells which provide different functions.  Red blood cells carry oxygen and are used for blood loss replacement.  White blood cells fight against infection.  Platelets control bleeding.  Plasma helps clot blood.  Other blood products are available for specialized needs, such as hemophilia or other clotting disorders. BEFORE THE TRANSFUSION  Who gives blood for transfusions?   Healthy volunteers who are fully evaluated to make sure their blood is safe. This is blood bank blood. Transfusion therapy is the safest it has ever been in the practice of medicine. Before blood is taken from a donor, a complete history is taken to make sure that person has no history of diseases nor engages in risky social behavior (examples are intravenous drug use or sexual activity with multiple partners). The donor's travel history is screened to minimize risk of transmitting infections, such as malaria. The donated blood is tested for signs of infectious diseases, such as HIV and hepatitis. The blood is then tested to be sure it is compatible with you in order to minimize the chance of a transfusion reaction. If  you or a relative donates blood, this is often done in anticipation of surgery and is not appropriate for emergency situations. It takes many days to process the donated blood. RISKS AND COMPLICATIONS Although transfusion therapy is very safe and saves many lives, the main dangers of transfusion include:   Getting an infectious disease.  Developing a transfusion reaction. This is an allergic reaction to something in the blood you were given. Every precaution is taken to prevent this. The decision to have a blood transfusion has been considered carefully by your caregiver before blood is given. Blood is not given unless the benefits outweigh the risks. AFTER THE TRANSFUSION  Right  after receiving a blood transfusion, you will usually feel much better and more energetic. This is especially true if your red blood cells have gotten low (anemic). The transfusion raises the level of the red blood cells which carry oxygen, and this usually causes an energy increase.  The nurse administering the transfusion will monitor you carefully for complications. HOME CARE INSTRUCTIONS  No special instructions are needed after a transfusion. You may find your energy is better. Speak with your caregiver about any limitations on activity for underlying diseases you may have. SEEK MEDICAL CARE IF:   Your condition is not improving after your transfusion.  You develop redness or irritation at the intravenous (IV) site. SEEK IMMEDIATE MEDICAL CARE IF:  Any of the following symptoms occur over the next 12 hours:  Shaking chills.  You have a temperature by mouth above 102 F (38.9 C), not controlled by medicine.  Chest, back, or muscle pain.  People around you feel you are not acting correctly or are confused.  Shortness of breath or difficulty breathing.  Dizziness and fainting.  You get a rash or develop hives.  You have a decrease in urine output.  Your urine turns a dark color or changes to pink, red, or brown. Any of the following symptoms occur over the next 10 days:  You have a temperature by mouth above 102 F (38.9 C), not controlled by medicine.  Shortness of breath.  Weakness after normal activity.  The white part of the eye turns yellow (jaundice).  You have a decrease in the amount of urine or are urinating less often.  Your urine turns a dark color or changes to pink, red, or brown. Document Released: 10/24/2000 Document Revised: 01/19/2012 Document Reviewed: 06/12/2008 [facility] Patient Information 2014 [place], [place].  _______________________________________________________________________

## 2018-11-01 ENCOUNTER — Encounter (HOSPITAL_COMMUNITY)
Admission: RE | Admit: 2018-11-01 | Discharge: 2018-11-01 | Disposition: A | Discharge status: Home / Self Care | Payer: BLUE CROSS/BLUE SHIELD | Source: Ambulatory Visit | Attending: Obstetrics | Admitting: Obstetrics

## 2018-11-01 ENCOUNTER — Encounter (HOSPITAL_COMMUNITY): Payer: Self-pay

## 2018-11-01 ENCOUNTER — Other Ambulatory Visit: Payer: Self-pay

## 2018-11-01 DIAGNOSIS — Z01812 Encounter for preprocedural laboratory examination: Secondary | ICD-10-CM | POA: Diagnosis not present

## 2018-11-01 HISTORY — DX: Anemia, unspecified: D64.9

## 2018-11-01 LAB — COMPREHENSIVE METABOLIC PANEL
ALT: 17 U/L (ref 0–44)
AST: 23 U/L (ref 15–41)
Albumin: 4.2 g/dL (ref 3.5–5.0)
Alkaline Phosphatase: 39 U/L (ref 38–126)
Anion gap: 5 (ref 5–15)
BUN: 12 mg/dL (ref 6–20)
CALCIUM: 8.7 mg/dL — AB (ref 8.9–10.3)
CO2: 22 mmol/L (ref 22–32)
Chloride: 109 mmol/L (ref 98–111)
Creatinine, Ser: 0.85 mg/dL (ref 0.44–1.00)
GFR calc non Af Amer: >60 mL/min (ref >60)
Glucose, Bld: 111 mg/dL — ABNORMAL HIGH (ref 70–99)
Potassium: 3.8 mmol/L (ref 3.5–5.1)
SODIUM: 136 mmol/L (ref 135–145)
Total Bilirubin: 0.2 mg/dL — ABNORMAL LOW (ref 0.3–1.2)
Total Protein: 7.1 g/dL (ref 6.5–8.1)

## 2018-11-01 LAB — CBC
HCT: 34.2 % — ABNORMAL LOW (ref 36.0–46.0)
HEMOGLOBIN: 11 g/dL — AB (ref 12.0–15.0)
MCH: 30.2 pg (ref 26.0–34.0)
MCHC: 32.2 g/dL (ref 30.0–36.0)
MCV: 94 fL (ref 80.0–100.0)
Platelets: 409 10*3/uL — ABNORMAL HIGH (ref 150–400)
RBC: 3.64 MIL/uL — ABNORMAL LOW (ref 3.87–5.11)
RDW: 13.3 % (ref 11.5–15.5)
WBC: 3.9 10*3/uL — ABNORMAL LOW (ref 4.0–10.5)
nRBC: 0 % (ref 0.0–0.2)

## 2018-11-01 LAB — URINALYSIS, ROUTINE W REFLEX MICROSCOPIC
BILIRUBIN URINE: NEGATIVE
Glucose, UA: NEGATIVE mg/dL
Hgb urine dipstick: NEGATIVE
Ketones, ur: NEGATIVE mg/dL
Leukocytes, UA: NEGATIVE
Nitrite: NEGATIVE
Protein, ur: NEGATIVE mg/dL
Specific Gravity, Urine: 1.005 (ref 1.005–1.030)
pH: 8 (ref 5.0–8.0)

## 2018-11-01 LAB — ABO/RH: ABO/RH(D): A POS

## 2018-11-01 NOTE — Progress Notes (Signed)
Clarified with Melissa Cross,NP  Prepare RBCS is just prepare not to be tranfused preop  Just ready if needed

## 2018-11-01 NOTE — Progress Notes (Signed)
Labs 10-28-18 cbc/diff, bmp, urine preg  Epic cxr 10-28-18 epic

## 2018-11-03 MED ORDER — SODIUM CHLORIDE 0.9 % IV SOLN
INTRAVENOUS | Status: DC
  Filled 2018-11-03: qty 30

## 2018-11-04 ENCOUNTER — Inpatient Hospital Stay (HOSPITAL_COMMUNITY): Payer: BLUE CROSS/BLUE SHIELD | Admitting: Certified Registered"

## 2018-11-04 ENCOUNTER — Encounter (HOSPITAL_COMMUNITY)
Admission: RE | Disposition: A | Discharge status: Home / Self Care | Payer: Self-pay | Source: Home / Self Care | Attending: Obstetrics

## 2018-11-04 ENCOUNTER — Other Ambulatory Visit: Payer: Self-pay

## 2018-11-04 ENCOUNTER — Inpatient Hospital Stay (HOSPITAL_COMMUNITY)
Admission: RE | Admit: 2018-11-04 | Discharge: 2018-11-06 | DRG: 743 | Disposition: A | Discharge status: Home / Self Care | Payer: BLUE CROSS/BLUE SHIELD | Attending: Obstetrics | Admitting: Obstetrics

## 2018-11-04 ENCOUNTER — Encounter (HOSPITAL_COMMUNITY): Payer: Self-pay | Admitting: *Deleted

## 2018-11-04 DIAGNOSIS — E2839 Other primary ovarian failure: Secondary | ICD-10-CM | POA: Diagnosis present

## 2018-11-04 DIAGNOSIS — F988 Other specified behavioral and emotional disorders with onset usually occurring in childhood and adolescence: Secondary | ICD-10-CM | POA: Diagnosis present

## 2018-11-04 DIAGNOSIS — N939 Abnormal uterine and vaginal bleeding, unspecified: Secondary | ICD-10-CM | POA: Diagnosis present

## 2018-11-04 DIAGNOSIS — Z825 Family history of asthma and other chronic lower respiratory diseases: Secondary | ICD-10-CM

## 2018-11-04 DIAGNOSIS — Z803 Family history of malignant neoplasm of breast: Secondary | ICD-10-CM

## 2018-11-04 DIAGNOSIS — E063 Autoimmune thyroiditis: Secondary | ICD-10-CM | POA: Diagnosis present

## 2018-11-04 DIAGNOSIS — J45909 Unspecified asthma, uncomplicated: Secondary | ICD-10-CM | POA: Diagnosis present

## 2018-11-04 DIAGNOSIS — Z91040 Latex allergy status: Secondary | ICD-10-CM

## 2018-11-04 DIAGNOSIS — F419 Anxiety disorder, unspecified: Secondary | ICD-10-CM | POA: Diagnosis present

## 2018-11-04 DIAGNOSIS — Z79890 Hormone replacement therapy (postmenopausal): Secondary | ICD-10-CM

## 2018-11-04 DIAGNOSIS — Z888 Allergy status to other drugs, medicaments and biological substances status: Secondary | ICD-10-CM | POA: Diagnosis not present

## 2018-11-04 DIAGNOSIS — Z833 Family history of diabetes mellitus: Secondary | ICD-10-CM | POA: Diagnosis not present

## 2018-11-04 DIAGNOSIS — Z818 Family history of other mental and behavioral disorders: Secondary | ICD-10-CM

## 2018-11-04 DIAGNOSIS — D269 Other benign neoplasm of uterus, unspecified: Secondary | ICD-10-CM | POA: Diagnosis not present

## 2018-11-04 DIAGNOSIS — Z8349 Family history of other endocrine, nutritional and metabolic diseases: Secondary | ICD-10-CM

## 2018-11-04 DIAGNOSIS — Z8249 Family history of ischemic heart disease and other diseases of the circulatory system: Secondary | ICD-10-CM | POA: Diagnosis not present

## 2018-11-04 DIAGNOSIS — N858 Other specified noninflammatory disorders of uterus: Secondary | ICD-10-CM | POA: Diagnosis present

## 2018-11-04 DIAGNOSIS — Z87440 Personal history of urinary (tract) infections: Secondary | ICD-10-CM

## 2018-11-04 DIAGNOSIS — Z79899 Other long term (current) drug therapy: Secondary | ICD-10-CM

## 2018-11-04 DIAGNOSIS — D649 Anemia, unspecified: Secondary | ICD-10-CM | POA: Diagnosis present

## 2018-11-04 DIAGNOSIS — F329 Major depressive disorder, single episode, unspecified: Secondary | ICD-10-CM | POA: Diagnosis present

## 2018-11-04 DIAGNOSIS — N838 Other noninflammatory disorders of ovary, fallopian tube and broad ligament: Secondary | ICD-10-CM | POA: Diagnosis present

## 2018-11-04 HISTORY — PX: HYSTERECTOMY ABDOMINAL WITH SALPINGECTOMY: SHX6725

## 2018-11-04 SURGERY — HYSTERECTOMY, TOTAL, ABDOMINAL, WITH SALPINGECTOMY
Anesthesia: General | Site: Abdomen

## 2018-11-04 MED ORDER — BUPIVACAINE HCL (PF) 0.25 % IJ SOLN
INTRAMUSCULAR | Status: AC
  Filled 2018-11-04: qty 30

## 2018-11-04 MED ORDER — ROCURONIUM BROMIDE 10 MG/ML (PF) SYRINGE
PREFILLED_SYRINGE | INTRAVENOUS | Status: DC | PRN
  Administered 2018-11-04: 50 mg via INTRAVENOUS
  Administered 2018-11-04 (×2): 10 mg via INTRAVENOUS

## 2018-11-04 MED ORDER — HYDROMORPHONE HCL 1 MG/ML IJ SOLN
0.2500 mg | INTRAMUSCULAR | Status: DC | PRN
  Administered 2018-11-04 (×2): 0.5 mg via INTRAVENOUS

## 2018-11-04 MED ORDER — PROPOFOL 10 MG/ML IV BOLUS
INTRAVENOUS | Status: AC
  Filled 2018-11-04: qty 20

## 2018-11-04 MED ORDER — KETOROLAC TROMETHAMINE 15 MG/ML IJ SOLN
15.0000 mg | Freq: Four times a day (QID) | INTRAMUSCULAR | Status: AC
  Administered 2018-11-04 – 2018-11-05 (×4): 15 mg via INTRAVENOUS
  Filled 2018-11-04 (×4): qty 1

## 2018-11-04 MED ORDER — LACTATED RINGERS IV SOLN
INTRAVENOUS | Status: DC
  Administered 2018-11-04: 10:00:00 via INTRAVENOUS

## 2018-11-04 MED ORDER — ACETAMINOPHEN 500 MG PO TABS
1000.0000 mg | ORAL_TABLET | ORAL | Status: AC
  Administered 2018-11-04: 1000 mg via ORAL
  Filled 2018-11-04: qty 2

## 2018-11-04 MED ORDER — FENTANYL CITRATE (PF) 100 MCG/2ML IJ SOLN
INTRAMUSCULAR | Status: DC | PRN
  Administered 2018-11-04 (×3): 50 ug via INTRAVENOUS
  Administered 2018-11-04: 100 ug via INTRAVENOUS
  Administered 2018-11-04: 50 ug via INTRAVENOUS

## 2018-11-04 MED ORDER — PROMETHAZINE HCL 25 MG/ML IJ SOLN: 6.2500 mg | INTRAMUSCULAR | Status: DC | PRN

## 2018-11-04 MED ORDER — LEVOTHYROXINE SODIUM 25 MCG PO TABS
125.0000 ug | ORAL_TABLET | Freq: Every day | ORAL | Status: DC
  Administered 2018-11-05 – 2018-11-06 (×2): 125 ug via ORAL
  Filled 2018-11-04 (×2): qty 1

## 2018-11-04 MED ORDER — FLUOXETINE HCL 20 MG PO CAPS
40.0000 mg | ORAL_CAPSULE | Freq: Every day | ORAL | Status: DC
  Administered 2018-11-05 – 2018-11-06 (×2): 40 mg via ORAL
  Filled 2018-11-04 (×2): qty 2

## 2018-11-04 MED ORDER — ACETAMINOPHEN 500 MG PO TABS
1000.0000 mg | ORAL_TABLET | Freq: Four times a day (QID) | ORAL | Status: DC
  Administered 2018-11-04 – 2018-11-06 (×7): 1000 mg via ORAL
  Filled 2018-11-04 (×7): qty 2

## 2018-11-04 MED ORDER — FENTANYL CITRATE (PF) 100 MCG/2ML IJ SOLN
INTRAMUSCULAR | Status: AC
  Filled 2018-11-04: qty 2

## 2018-11-04 MED ORDER — CELECOXIB 200 MG PO CAPS
400.0000 mg | ORAL_CAPSULE | ORAL | Status: AC
  Administered 2018-11-04: 400 mg via ORAL
  Filled 2018-11-04: qty 2

## 2018-11-04 MED ORDER — LIDOCAINE 2% (20 MG/ML) 5 ML SYRINGE
INTRAMUSCULAR | Status: DC | PRN
  Administered 2018-11-04: 100 mg via INTRAVENOUS

## 2018-11-04 MED ORDER — BUPIVACAINE HCL 0.25 % IJ SOLN
INTRAMUSCULAR | Status: DC | PRN
  Administered 2018-11-04: 20 mL

## 2018-11-04 MED ORDER — PREGABALIN 75 MG PO CAPS
75.0000 mg | ORAL_CAPSULE | Freq: Two times a day (BID) | ORAL | Status: DC
  Administered 2018-11-05 – 2018-11-06 (×3): 75 mg via ORAL
  Filled 2018-11-04 (×3): qty 1

## 2018-11-04 MED ORDER — MONTELUKAST SODIUM 10 MG PO TABS
10.0000 mg | ORAL_TABLET | Freq: Every day | ORAL | Status: DC
  Administered 2018-11-04 – 2018-11-05 (×2): 10 mg via ORAL
  Filled 2018-11-04 (×2): qty 1

## 2018-11-04 MED ORDER — ENSURE ENLIVE PO LIQD
237.0000 mL | Freq: Two times a day (BID) | ORAL | Status: DC
  Administered 2018-11-04 – 2018-11-06 (×4): 237 mL via ORAL

## 2018-11-04 MED ORDER — HYDROMORPHONE HCL 1 MG/ML IJ SOLN
INTRAMUSCULAR | Status: AC
  Filled 2018-11-04: qty 1

## 2018-11-04 MED ORDER — LIDOCAINE 2% (20 MG/ML) 5 ML SYRINGE
INTRAMUSCULAR | Status: AC
  Filled 2018-11-04: qty 5

## 2018-11-04 MED ORDER — HYDROMORPHONE HCL 1 MG/ML IJ SOLN
INTRAMUSCULAR | Status: DC | PRN
  Administered 2018-11-04 (×2): 1 mg via INTRAVENOUS

## 2018-11-04 MED ORDER — SODIUM CHLORIDE (PF) 0.9 % IJ SOLN
INTRAMUSCULAR | Status: AC
  Filled 2018-11-04: qty 20

## 2018-11-04 MED ORDER — HYDROMORPHONE HCL 1 MG/ML IJ SOLN
0.5000 mg | INTRAMUSCULAR | Status: DC | PRN
  Administered 2018-11-04 (×2): 0.5 mg via INTRAVENOUS
  Filled 2018-11-04 (×2): qty 0.5

## 2018-11-04 MED ORDER — SUGAMMADEX SODIUM 200 MG/2ML IV SOLN
INTRAVENOUS | Status: AC
  Filled 2018-11-04: qty 2

## 2018-11-04 MED ORDER — DOCUSATE SODIUM 100 MG PO CAPS
100.0000 mg | ORAL_CAPSULE | Freq: Two times a day (BID) | ORAL | Status: DC
  Administered 2018-11-04 – 2018-11-06 (×4): 100 mg via ORAL
  Filled 2018-11-04 (×4): qty 1

## 2018-11-04 MED ORDER — KCL IN DEXTROSE-NACL 20-5-0.45 MEQ/L-%-% IV SOLN
INTRAVENOUS | Status: DC
  Administered 2018-11-04: 17:00:00 via INTRAVENOUS
  Filled 2018-11-04: qty 1000

## 2018-11-04 MED ORDER — MIDAZOLAM HCL 2 MG/2ML IJ SOLN
INTRAMUSCULAR | Status: AC
  Filled 2018-11-04: qty 2

## 2018-11-04 MED ORDER — ALBUTEROL SULFATE (2.5 MG/3ML) 0.083% IN NEBU: 3.0000 mL | INHALATION_SOLUTION | Freq: Four times a day (QID) | RESPIRATORY_TRACT | Status: DC | PRN

## 2018-11-04 MED ORDER — HYDROMORPHONE HCL 2 MG/ML IJ SOLN
INTRAMUSCULAR | Status: AC
  Filled 2018-11-04: qty 1

## 2018-11-04 MED ORDER — PROPOFOL 10 MG/ML IV BOLUS
INTRAVENOUS | Status: DC | PRN
  Administered 2018-11-04: 180 mg via INTRAVENOUS

## 2018-11-04 MED ORDER — SODIUM CHLORIDE (PF) 0.9 % IJ SOLN
INTRAMUSCULAR | Status: DC | PRN
  Administered 2018-11-04: 20 mL

## 2018-11-04 MED ORDER — CHEWING GUM (ORBIT) SUGAR FREE
1.0000 | CHEWING_GUM | Freq: Three times a day (TID) | ORAL | Status: DC
  Administered 2018-11-04 – 2018-11-06 (×6): 1 via ORAL
  Filled 2018-11-04: qty 1

## 2018-11-04 MED ORDER — ONDANSETRON HCL 4 MG PO TABS: 4.0000 mg | ORAL_TABLET | Freq: Four times a day (QID) | ORAL | Status: DC | PRN

## 2018-11-04 MED ORDER — MEPERIDINE HCL 50 MG/ML IJ SOLN: 6.2500 mg | INTRAMUSCULAR | Status: DC | PRN

## 2018-11-04 MED ORDER — SCOPOLAMINE 1 MG/3DAYS TD PT72
1.0000 | MEDICATED_PATCH | TRANSDERMAL | Status: DC
  Administered 2018-11-04: 1.5 mg via TRANSDERMAL
  Filled 2018-11-04: qty 1

## 2018-11-04 MED ORDER — OXYCODONE HCL 5 MG PO TABS
5.0000 mg | ORAL_TABLET | ORAL | Status: DC | PRN
  Administered 2018-11-04 – 2018-11-05 (×5): 5 mg via ORAL
  Filled 2018-11-04 (×5): qty 1

## 2018-11-04 MED ORDER — MIDAZOLAM HCL 5 MG/5ML IJ SOLN
INTRAMUSCULAR | Status: DC | PRN
  Administered 2018-11-04: 2 mg via INTRAVENOUS

## 2018-11-04 MED ORDER — STERILE WATER FOR IRRIGATION IR SOLN
Status: DC | PRN
  Administered 2018-11-04: 2000 mL

## 2018-11-04 MED ORDER — FENTANYL CITRATE (PF) 250 MCG/5ML IJ SOLN
INTRAMUSCULAR | Status: AC
  Filled 2018-11-04: qty 5

## 2018-11-04 MED ORDER — ONDANSETRON HCL 4 MG/2ML IJ SOLN
INTRAMUSCULAR | Status: DC | PRN
  Administered 2018-11-04 (×2): 4 mg via INTRAVENOUS

## 2018-11-04 MED ORDER — ROCURONIUM BROMIDE 10 MG/ML (PF) SYRINGE
PREFILLED_SYRINGE | INTRAVENOUS | Status: AC
  Filled 2018-11-04: qty 10

## 2018-11-04 MED ORDER — LACTATED RINGERS IV SOLN
INTRAVENOUS | Status: DC | PRN
  Administered 2018-11-04 (×3): via INTRAVENOUS

## 2018-11-04 MED ORDER — DEXAMETHASONE SODIUM PHOSPHATE 4 MG/ML IJ SOLN
4.0000 mg | INTRAMUSCULAR | Status: AC
  Administered 2018-11-04: 8 mg via INTRAVENOUS

## 2018-11-04 MED ORDER — KETOROLAC TROMETHAMINE 30 MG/ML IJ SOLN: 30.0000 mg | Freq: Once | INTRAMUSCULAR | Status: DC | PRN

## 2018-11-04 MED ORDER — CEFAZOLIN SODIUM-DEXTROSE 2-4 GM/100ML-% IV SOLN
2.0000 g | INTRAVENOUS | Status: AC
  Administered 2018-11-04: 2 g via INTRAVENOUS
  Filled 2018-11-04: qty 100

## 2018-11-04 MED ORDER — IBUPROFEN 200 MG PO TABS
600.0000 mg | ORAL_TABLET | Freq: Four times a day (QID) | ORAL | Status: DC
  Administered 2018-11-05 – 2018-11-06 (×4): 600 mg via ORAL
  Filled 2018-11-04 (×4): qty 3

## 2018-11-04 MED ORDER — SODIUM CHLORIDE 0.9 % IV SOLN
INTRAVENOUS | Status: DC | PRN
  Administered 2018-11-04: 13:00:00 via INTRAVENOUS

## 2018-11-04 MED ORDER — BUPIVACAINE LIPOSOME 1.3 % IJ SUSP
20.0000 mL | Freq: Once | INTRAMUSCULAR | Status: AC
  Administered 2018-11-04: 20 mL
  Filled 2018-11-04: qty 20

## 2018-11-04 MED ORDER — ONDANSETRON HCL 4 MG/2ML IJ SOLN: 4.0000 mg | Freq: Four times a day (QID) | INTRAMUSCULAR | Status: DC | PRN

## 2018-11-04 MED ORDER — 0.9 % SODIUM CHLORIDE (POUR BTL) OPTIME
TOPICAL | Status: DC | PRN
  Administered 2018-11-04: 1000 mL

## 2018-11-04 SURGICAL SUPPLY — 54 items
ATTRACTOMAT 16X20 MAGNETIC DRP (DRAPES) ×3 IMPLANT
BINDER ABDOMINAL 12 ML 46-62 (SOFTGOODS) ×3 IMPLANT
BLADE EXTENDED COATED 6.5IN (ELECTRODE) ×3 IMPLANT
CHLORAPREP W/TINT 26ML (MISCELLANEOUS) ×3 IMPLANT
CLIP VESOCCLUDE LG 6/CT (CLIP) ×3 IMPLANT
CLIP VESOCCLUDE MED 6/CT (CLIP) ×3 IMPLANT
CONT SPEC 4OZ CLIKSEAL STRL BL (MISCELLANEOUS) ×3 IMPLANT
COVER WAND RF STERILE (DRAPES) IMPLANT
DERMABOND ADVANCED (GAUZE/BANDAGES/DRESSINGS) ×1
DERMABOND ADVANCED .7 DNX12 (GAUZE/BANDAGES/DRESSINGS) ×2 IMPLANT
DRAPE INCISE IOBAN 66X45 STRL (DRAPES) IMPLANT
DRAPE UNDERBUTTOCKS STRL (DRAPE) ×3 IMPLANT
DRAPE WARM FLUID 44X44 (DRAPE) ×3 IMPLANT
DRSG OPSITE POSTOP 4X8 (GAUZE/BANDAGES/DRESSINGS) ×3 IMPLANT
ELECT REM PT RETURN 15FT ADLT (MISCELLANEOUS) ×3 IMPLANT
GAUZE 4X4 16PLY RFD (DISPOSABLE) IMPLANT
GAUZE SPONGE 4X4 12PLY STRL (GAUZE/BANDAGES/DRESSINGS) ×3 IMPLANT
GAUZE SPONGE 4X4 16PLY XRAY LF (GAUZE/BANDAGES/DRESSINGS) ×3 IMPLANT
GLOVE BIO SURGEON STRL SZ 6 (GLOVE) ×3 IMPLANT
GLOVE BIO SURGEON STRL SZ 6.5 (GLOVE) ×3 IMPLANT
GLOVE BIOGEL PI IND STRL 7.0 (GLOVE) ×4 IMPLANT
GLOVE BIOGEL PI INDICATOR 7.0 (GLOVE) ×2
GLOVE SURG SS PI 6.5 STRL IVOR (GLOVE) ×3 IMPLANT
GOWN STRL REUS W/ TWL LRG LVL3 (GOWN DISPOSABLE) ×2 IMPLANT
GOWN STRL REUS W/TWL LRG LVL3 (GOWN DISPOSABLE) ×7 IMPLANT
HANDLE SUCTION POOLE (INSTRUMENTS) IMPLANT
KIT BASIN OR (CUSTOM PROCEDURE TRAY) ×3 IMPLANT
LIGASURE IMPACT 36 18CM CVD LR (INSTRUMENTS) ×3 IMPLANT
NEEDLE HYPO 22GX1.5 SAFETY (NEEDLE) ×6 IMPLANT
NS IRRIG 1000ML POUR BTL (IV SOLUTION) ×6 IMPLANT
PACK GENERAL/GYN (CUSTOM PROCEDURE TRAY) ×3 IMPLANT
RETAINER VISCERA MED (MISCELLANEOUS) IMPLANT
RETRACTOR WND ALEXIS 25 LRG (MISCELLANEOUS) ×2 IMPLANT
RTRCTR WOUND ALEXIS 25CM LRG (MISCELLANEOUS) ×3
SEPRAFILM MEMBRANE 5X6 (MISCELLANEOUS) IMPLANT
SHEET LAVH (DRAPES) ×3 IMPLANT
SPONGE LAP 18X18 RF (DISPOSABLE) IMPLANT
SUCTION POOLE HANDLE (INSTRUMENTS)
SUT MNCRL AB 4-0 PS2 18 (SUTURE) ×6 IMPLANT
SUT PDS AB 1 TP1 96 (SUTURE) ×6 IMPLANT
SUT PLAIN 2 0 XLH (SUTURE) IMPLANT
SUT PROLENE 0 SH 30 (SUTURE) ×3 IMPLANT
SUT SILK 2 0 (SUTURE)
SUT SILK 2-0 18XBRD TIE 12 (SUTURE) IMPLANT
SUT SILK 3 0 SH CR/8 (SUTURE) ×3 IMPLANT
SUT VIC AB 0 CT1 18XCR BRD 8 (SUTURE) ×2 IMPLANT
SUT VIC AB 0 CT1 36 (SUTURE) ×30 IMPLANT
SUT VIC AB 0 CT1 8-18 (SUTURE) ×1
SUT VIC AB 4-0 PS2 18 (SUTURE) ×6 IMPLANT
SUT VICRYL 0 TIES 12 18 (SUTURE) ×3 IMPLANT
SYR 30ML LL (SYRINGE) ×6 IMPLANT
TOWEL OR 17X26 10 PK STRL BLUE (TOWEL DISPOSABLE) ×3 IMPLANT
TOWEL OR NON WOVEN STRL DISP B (DISPOSABLE) ×3 IMPLANT
TRAY FOLEY MTR SLVR 14FR STAT (SET/KITS/TRAYS/PACK) ×3 IMPLANT

## 2018-11-04 NOTE — Interval H&P Note (Signed)
History and Physical Interval Note:  11/04/2018 10:26 AM  Cynthia Burke  has presented today for surgery, with the diagnosis of uterine mass  The various methods of treatment have been discussed with the patient and family. After consideration of risks, benefits and other options for treatment, the patient has consented to  Procedure(s): EXPLORATORY LAPAROTOMY (N/A) HYSTERECTOMY ABDOMINAL WITH BILATERAL SALPINGECTOMY, POSSIBLE BILATERAL OOPHORECTOMY, POSSIBLE STAGING (Bilateral) as a surgical intervention .  The patient's history has been reviewed, patient examined, no change in status, stable for surgery.  I have reviewed the patient's chart and labs.  Questions were answered to the patient's satisfaction.     Isabel Caprice

## 2018-11-04 NOTE — Anesthesia Postprocedure Evaluation (Signed)
Anesthesia Post Note  Patient: Cynthia Burke  Procedure(s) Performed: HYSTERECTOMY ABDOMINAL WITH BILATERAL SALPINGECTOMY, Exploratory laparotomy (Bilateral Abdomen)     Patient location during evaluation: PACU Anesthesia Type: General Level of consciousness: awake Pain management: pain level controlled Vital Signs Assessment: post-procedure vital signs reviewed and stable Respiratory status: spontaneous breathing Cardiovascular status: stable Postop Assessment: no apparent nausea or vomiting Anesthetic complications: no    Last Vitals:  Vitals:   11/04/18 1701 11/04/18 1819  BP: 123/81 120/83  Pulse: 89 67  Resp: 16 16  Temp: 36.6 C 36.6 C  SpO2: 98% 100%    Last Pain:  Vitals:   11/04/18 1844  TempSrc:   PainSc: 1    Pain Goal: Patients Stated Pain Goal: 2 (11/04/18 1744)               Huston Foley

## 2018-11-04 NOTE — Op Note (Addendum)
Date: 11/04/18  Preoperative Diagnosis:  1. 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 Exploratory laparotomy with total abdominal hysterectomy 2. Bilateral salpingectomy  Surgeon: Mart Piggs, MD  Assistant Surgeon: Lahoma Crocker, M.D. (an MD assistant was necessary for tissue manipulation, management of instrumentation, retraction and positioning due to the complexity of the case and hospital policies).  Anesthesia: GETA  Specimens: Uterus Cervix, bilateral tubes  Estimated Blood Loss:~ 500 mL.    Complications: None.   Operative Findings: Smooth walled posteriorly located myoma that resulted in elongation of uterus anteriorly and was wedged into pelvic floor.This made visualization difficult and manipulation of the uterus cumbersome with difficult dissection level beyond a routine open hysterectomy. Some bleeding experienced from bilateral cornua when using retractors. Inability to manipulate/elevate uterus out of pelvis and limited space with Bookwalter in place requiring placement of large Alexis retractor and manual retraction. Normal appearing ovaries bilaterally. Bilateral tubes with small paratubal cysts. Normal appendix and normal upper abdomen on exploration. Frozen section on the uterus revealed hemorrhage suspect degenerating fibroid, possibly adenomyosis. No atypia on representative sections.  Procedure in Detail:    The patient was then taken to the operating room and placed in supine position with SCD hose on. General anesthesia was then induced without difficulty. She was then placed in the dorsolithotomy position. The abdomen was prepped with chlor prep sponges per protocol. Perineum and vagina were prepped with Betadine. A Foley catheter was inserted into the bladder under sterile conditions. A spongestick was placed per vagina to delineate  the vaginal fornices.  The patient was then draped after the prep was dried. Timeout was performed the patient, procedure, antibiotic, allergy, and length of procedure. A vertical midline incision was made and carried down to the underlying fascia using Bovie cautery. The fascia was scored in the fascial incision was extended superiorly and inferiorly using Bovie cautery. The rectus bellies were dissected off the overlying fascia. The peritoneum was tented and entered. The peritoneal incision was extended superiorly and inferiorly with visualization of the underlying peritoneal cavity. The Bookwalter retractor was then placed.   The small and large bowel were packed out of the way of the surgical field with moist laparotomy sponges and malleable retractors were attached to the bookwalter. The round ligament on the patient's right side was transected with monopolar cautery the anterior posterior leaves the broad ligament were opened. The ureter visualized and a window made under the utero-ovarian ligament. The right fallopian tube was taken down with the Ligasure and sent for permanent section. The uteroovarian ligament was isolated and cauterized/transected using the Ligasure. In a similar fashion on the left the fallopian tube was removed, the retroperitoneal space opened, and the uteroovarian ligament isolated, cauterized, and transected. The posterior broad ligament from the adnexae to the uterine body was taken down on either side. I placed Kelly clamps on the cornua to try to manipulate the uterus but the tissue tore. Hemostasis was obtained. We tried to elevated the uterus to define the bladder flap and insertion of the uterine vessels however the lateral retractor blades were hindering our ability to perform this maneuver. Evaluation of the surgical field revealed inability to manipulate the uterus well (as noted in the findings) and so I placed an Chiropodist which helped. We were able to manipulate  the uterus sufficiently to create the bladder flap, using the vaginal spongestick to confirm anticipated location  of the cervico-vaginal junction. There was some bleeding from some of the peritoneal surfaces here and the area of takedown itself, along with some bleeding where the bladder flap was hard to define. Cell-saver was initiated. The bladder flap was taken down sufficiently and then the expected location of the uterine vessels was skeletonized. Haney clamps were placed on the uterine vessels bilaterally and the pedicle transected and ligated. Small vessels were oozing while completing the hysterectomy. The bladder flap was further taken down and the plane was better defined. Posteriorly the Ligasure was used to clamp, coagulate, and transect the uterosacral ligaments at their insertion into the uterus. We continued down the cardinal ligaments with straight Haney clamps until the cervicovaginal junction was reached. We came across the vagina just under the cervix with curved Masterson clamps. The cervix was amputated from the vagina. The angle pedicles as well as vaginal cuff were closed using figure-of-eight sutures of 0 Vicryl.  Frozen section of the uterus was requested.   Hemostasis was obtained. The abdomen/ pelvis was copiously irrigated. The retractor and laparotomy sponges were removed. The fascia was closed using running mass closure of #1 PDS.   Frozen section returned as above. Cell saver collection was perfused to the patient.  The subcutaneous tissues were irrigated and made hemostatic. 20 mL of Exparel with 20 mL of normal saline and 64mL 0.25% lidocaine was injected for postoperative pain control. The skin was closed using interrupted 4-0 vicryl in the dermis and then subcuticular 4-0 monocryl for closure of the skin. Dermabond was applied.   All instrument, suture, laparotomy, Ray-Tec, and needle counts were correct x2. The patient tolerated the procedure well and was taken recovery  room in stable condition.

## 2018-11-04 NOTE — Anesthesia Procedure Notes (Signed)
Procedure Name: Intubation Date/Time: 11/04/2018 11:05 AM Performed by: Benjermin Korber D, CRNA Pre-anesthesia Checklist: Patient identified, Emergency Drugs available, Suction available and Patient being monitored Patient Re-evaluated:Patient Re-evaluated prior to induction Oxygen Delivery Method: Circle system utilized Preoxygenation: Pre-oxygenation with 100% oxygen Induction Type: IV induction Ventilation: Mask ventilation without difficulty Laryngoscope Size: Mac and 3 Grade View: Grade I Tube type: Oral Number of attempts: 1 Airway Equipment and Method: Stylet Placement Confirmation: ETT inserted through vocal cords under direct vision,  positive ETCO2 and breath sounds checked- equal and bilateral Secured at: 21 cm Tube secured with: Tape Dental Injury: Teeth and Oropharynx as per pre-operative assessment

## 2018-11-04 NOTE — Transfer of Care (Signed)
Immediate Anesthesia Transfer of Care Note  Patient: Cynthia Burke  Procedure(s) Performed: HYSTERECTOMY ABDOMINAL WITH BILATERAL SALPINGECTOMY, Exploratory laparotomy (Bilateral Abdomen)  Patient Location: PACU  Anesthesia Type:General  Level of Consciousness: awake, alert  and oriented  Airway & Oxygen Therapy: Patient Spontanous Breathing and Patient connected to face mask oxygen  Post-op Assessment: Report given to RN and Post -op Vital signs reviewed and stable  Post vital signs: Reviewed and stable  Last Vitals:  Vitals Value Taken Time  BP    Temp    Pulse 100 11/04/2018  1:51 PM  Resp    SpO2 100 % 11/04/2018  1:51 PM  Vitals shown include unvalidated device data.  Last Pain:  Vitals:   11/04/18 0900  TempSrc:   PainSc: 5       Patients Stated Pain Goal: 3 (29/09/03 0149)  Complications: No apparent anesthesia complications

## 2018-11-04 NOTE — Anesthesia Preprocedure Evaluation (Signed)
Anesthesia Evaluation   Patient awake    Reviewed: Allergy & Precautions, NPO status , Patient's Chart, lab work & pertinent test results  Airway Mallampati: I  TM Distance: >3 FB Neck ROM: Full    Dental no notable dental hx. (+) Teeth Intact   Pulmonary asthma ,    Pulmonary exam normal breath sounds clear to auscultation       Cardiovascular hypertension, Normal cardiovascular exam Rhythm:Regular Rate:Normal     Neuro/Psych PSYCHIATRIC DISORDERS Anxiety Depression    GI/Hepatic negative GI ROS, Neg liver ROS,   Endo/Other  Hypothyroidism   Renal/GU negative Renal ROS     Musculoskeletal negative musculoskeletal ROS (+)   Abdominal Normal abdominal exam  (+)   Peds  Hematology  (+) Blood dyscrasia, anemia ,   Anesthesia Other Findings   Reproductive/Obstetrics negative OB ROS                             Anesthesia Physical Anesthesia Plan  ASA: II  Anesthesia Plan: General   Post-op Pain Management:    Induction: Intravenous  PONV Risk Score and Plan: 4 or greater and Ondansetron, Dexamethasone, Midazolam and Scopolamine patch - Pre-op  Airway Management Planned: Oral ETT  Additional Equipment:   Intra-op Plan:   Post-operative Plan: Extubation in OR  Informed Consent: I have reviewed the patients History and Physical, chart, labs and discussed the procedure including the risks, benefits and alternatives for the proposed anesthesia with the patient or authorized representative who has indicated his/her understanding and acceptance.   Dental advisory given  Plan Discussed with: CRNA  Anesthesia Plan Comments:         Anesthesia Quick Evaluation

## 2018-11-05 ENCOUNTER — Encounter (HOSPITAL_COMMUNITY): Payer: Self-pay | Admitting: Obstetrics

## 2018-11-05 LAB — BASIC METABOLIC PANEL
Anion gap: 8 (ref 5–15)
BUN: 11 mg/dL (ref 6–20)
CO2: 25 mmol/L (ref 22–32)
Calcium: 8.5 mg/dL — ABNORMAL LOW (ref 8.9–10.3)
Chloride: 105 mmol/L (ref 98–111)
Creatinine, Ser: 0.86 mg/dL (ref 0.44–1.00)
GFR calc Af Amer: >60 mL/min (ref >60)
GFR calc non Af Amer: >60 mL/min (ref >60)
GLUCOSE: 117 mg/dL — AB (ref 70–99)
Potassium: 4.4 mmol/L (ref 3.5–5.1)
Sodium: 138 mmol/L (ref 135–145)

## 2018-11-05 LAB — CBC
HEMATOCRIT: 31 % — AB (ref 36.0–46.0)
Hemoglobin: 9.8 g/dL — ABNORMAL LOW (ref 12.0–15.0)
MCH: 30.2 pg (ref 26.0–34.0)
MCHC: 31.6 g/dL (ref 30.0–36.0)
MCV: 95.4 fL (ref 80.0–100.0)
Platelets: 310 10*3/uL (ref 150–400)
RBC: 3.25 MIL/uL — ABNORMAL LOW (ref 3.87–5.11)
RDW: 13 % (ref 11.5–15.5)
WBC: 10.3 10*3/uL (ref 4.0–10.5)
nRBC: 0 % (ref 0.0–0.2)

## 2018-11-05 NOTE — Progress Notes (Signed)
1 Day Post-Op Procedure(s) (LRB): HYSTERECTOMY ABDOMINAL WITH BILATERAL SALPINGECTOMY, Exploratory laparotomy (Bilateral)  Subjective: Patient reports doing well this am.  Ambulating in the halls without difficulty.  Incisional pain reported more with movement but managed with PRN medications. Voiding without difficulty.  Tolerating diet at this time with no nausea or emesis.  No flatus or BM. Husband at the bedside. No chest pain or dyspnea. No concerns voiced.    Objective: Vital signs in last 24 hours: Temp:  [97.8 F (36.6 C)-98.5 F (36.9 C)] 98.5 F (36.9 C) (12/27 0541) Pulse Rate:  [67-100] 77 (12/27 0541) Resp:  [10-16] 16 (12/27 0541) BP: (111-143)/(70-93) 111/78 (12/27 0541) SpO2:  [98 %-100 %] 100 % (12/27 0541)    Intake/Output from previous day: 12/26 0701 - 12/27 0700 In: 4132.1 [P.O.:240; I.V.:3792.1; Blood:100] Out: 2325 [Urine:1825; Blood:500]  Physical Examination: General: alert, cooperative and no distress Resp: clear to auscultation bilaterally Cardio: regular rate and rhythm, S1, S2 normal, no murmur, click, rub or gallop GI: incision: midline incision with op site dressing in place and intact, stainmarked with blood but no active drainage appreciated under dressing and abdomen soft, binder in place, active bowel sounds Extremities: extremities normal, atraumatic, no cyanosis or edema  Labs: WBC/Hgb/Hct/Plts:  10.3/9.8/31.0/310 (12/27 0501) BUN/Cr/glu/ALT/AST/amyl/lip:  11/0.86/--/--/--/--/-- (12/27 0501)  Assessment: 42 y.o. s/p Procedure(s): HYSTERECTOMY ABDOMINAL WITH BILATERAL SALPINGECTOMY, Exploratory laparotomy: stable Pain:  Pain is well-controlled on PRN medications.  Heme: Hgb 9.8 and Hct 31.0 this am. Appropriate compared with pre-op values and surgical losses.   CV: BP and HR stable. Continue to monitor with ordered vital signs.  GI:  Tolerating po: Yes. Scopolamine patch in place. Antiemetics ordered PRN.  GU: Voiding adequate amounts.  Creatinine 0.86 this am.    FEN: Appropriate this am. No critical values.  Prophylaxis: intermittent pneumatic compression boots.  Plan: Saline lock IV if diet tolerated this am Diet to regular Rocking chair to the room Encourage IS use, deep breathing, coughing, ambulating Continue plan of care per Dr. Denman George   LOS: 1 day    Melissa D Cross 11/05/2018, 9:17 AM

## 2018-11-06 LAB — CBC
HCT: 28.3 % — ABNORMAL LOW (ref 36.0–46.0)
HEMOGLOBIN: 8.9 g/dL — AB (ref 12.0–15.0)
MCH: 30.3 pg (ref 26.0–34.0)
MCHC: 31.4 g/dL (ref 30.0–36.0)
MCV: 96.3 fL (ref 80.0–100.0)
Platelets: 270 10*3/uL (ref 150–400)
RBC: 2.94 MIL/uL — ABNORMAL LOW (ref 3.87–5.11)
RDW: 13.2 % (ref 11.5–15.5)
WBC: 8.1 10*3/uL (ref 4.0–10.5)
nRBC: 0 % (ref 0.0–0.2)

## 2018-11-06 MED ORDER — BISACODYL 10 MG RE SUPP: 10.0000 mg | Freq: Every day | RECTAL | Status: DC | PRN

## 2018-11-06 MED ORDER — OXYCODONE-ACETAMINOPHEN 5-325 MG PO TABS: 1.0000 | ORAL_TABLET | ORAL | 0 refills | Status: AC | PRN

## 2018-11-06 NOTE — Discharge Summary (Signed)
Physician Discharge Summary  Patient ID: Cynthia Burke MRN: [id number] DOB/AGE: 1976-05-09 42 y.o.  Admit date: 11/04/2018 Discharge date: 11/06/2018  Admission Diagnoses: Uterine mass Discharge Diagnoses:  Active Problems:   Uterine mass   Discharged Condition: good  Hospital Course: On 11/04/2018, the patient underwent the following: Procedure(s): HYSTERECTOMY ABDOMINAL WITH BILATERAL SALPINGECTOMY, Exploratory laparotomy.   The postoperative course was uneventful.  The hemoglobin trended down to 8.9 on POD#2.  She was asymptomatic.  She was discharged to home on postoperative day 2 tolerating a regular diet.  Consults: None  Significant Diagnostic Studies: angiography: None  Treatments: surgery: see above  Discharge Exam: Blood pressure 120/79, pulse 69, temperature 97.7 F (36.5 C), temperature source Oral, resp. rate 14, height 5\' 7"  (1.702 m), weight 74 kg, SpO2 100 %. General appearance: alert Resp: clear to auscultation bilaterally Cardio: regular rate and rhythm, S1, S2 normal, no murmur, click, rub or gallop GI: softly distended Extremities: Homans sign is negative, no sign of DVT Incision/Wound:  Disposition: Discharge disposition: 01-Home or Self Care       Discharge Instructions    Activity as tolerated - No restrictions   Complete by:  As directed    Call MD for:  extreme fatigue   Complete by:  As directed    Call MD for:  persistant dizziness or light-headedness   Complete by:  As directed    Call MD for:  persistant nausea and vomiting   Complete by:  As directed    Call MD for:  redness, tenderness, or signs of infection (pain, swelling, redness, odor or green/yellow discharge around incision site)   Complete by:  As directed    Call MD for:  severe uncontrolled pain   Complete by:  As directed    Call MD for:  temperature >100.4   Complete by:  As directed    Diet - low sodium heart healthy   Complete by:  As directed    Diet Carb  Modified   Complete by:  As directed    Diet general   Complete by:  As directed    Discharge instructions   Complete by:  As directed    Return to work: 6 weeks  Activity: 1. Be up and out of the bed during the day.  Take a nap if needed.  You may walk up steps but be careful and use the hand rail.  Stair climbing will tire you more than you think, you may need to stop part way and rest.   2. No lifting or straining for 6 weeks.  3. No driving for 1-2 weeks.  Do Not drive if you are taking narcotic pain medicine.  4. Shower daily.  Use soap and water on your incision and pat dry; don't rub.   5. No sexual activity and nothing in the vagina for 4 weeks.  Diet: 1. Low sodium Heart Healthy Diet is recommended.  2. It is safe to use a laxative if you have difficulty moving your bowels.   Wound Care: 1. Keep clean and dry.  Shower daily.  Reasons to call the Doctor:  Fever - Oral temperature greater than 100.4 degrees Fahrenheit Foul-smelling vaginal discharge Difficulty urinating Nausea and vomiting Increased pain at the site of the incision that is unrelieved with pain medicine. Difficulty breathing with or without chest pain New calf pain especially if only on one side Sudden, continuing increased vaginal bleeding with or without clots.   Follow-up: 1. See Precious Haws in  4 weeks.  Contacts: For questions or concerns you should contact:  Dr. Precious Haws at [phone]  or at [place]   Discharge wound care:   Complete by:  As directed    Keep clean and dry   Driving Restrictions   Complete by:  As directed    No driving for 1- 2 weeks   Increase activity slowly   Complete by:  As directed    Lifting restrictions   Complete by:  As directed    No lifting > 10 lbs for 6 weeks   May shower / Bathe   Complete by:  As directed    No tub baths for 6 weeks   Sexual Activity Restrictions   Complete by:  As directed    No intercourse for 6 - 8 weeks      Allergies as of 11/06/2018      Reactions   Wellbutrin [bupropion] Other (See Comments)   Seizures    Latex Other (See Comments)   Itching, sneezing   Other Other (See Comments)   Tree pollen       Medication List    TAKE these medications   albuterol 108 (90 Base) MCG/ACT inhaler Commonly known as:  PROVENTIL HFA;VENTOLIN HFA Inhale 1-2 puffs into the lungs every 6 (six) hours as needed for wheezing or shortness of breath.   azelastine 0.1 % nasal spray Commonly known as:  ASTELIN Place 2 sprays into both nostrils 2 (two) times daily as needed for allergies.   CoQ10 100 MG Caps Take 900 mg by mouth 2 (two) times daily.   dextroamphetamine 10 MG 24 hr capsule Commonly known as:  DEXEDRINE SPANSULE Take 20 mg by mouth 2 (two) times daily.   FLUoxetine 40 MG capsule Commonly known as:  PROZAC Take 40 mg by mouth daily.   L-Methylfolate 15 MG Tabs Take 45 mg by mouth daily.   medroxyPROGESTERone 10 MG tablet Commonly known as:  PROVERA Take 10 mg by mouth See admin instructions. Take 10 mg by mouth 3 times daily for 1 week, take 10 mg by mouth twice daily for 1 week, then take 10 mg by mouth daily for 1 week   montelukast 10 MG tablet Commonly known as:  SINGULAIR Take 1 tablet (10 mg total) by mouth at bedtime.   MULTIVITAMIN ADULT Chew Chew 2 each by mouth daily.   oxyCODONE-acetaminophen 5-325 MG tablet Commonly known as:  PERCOCET/ROXICET Take 1-2 tablets by mouth every 4 (four) hours as needed for up to 3 days for severe pain.   TURMERIC CURCUMIN PO Take 2 capsules by mouth daily.   UNITHROID 125 MCG tablet Generic drug:  levothyroxine TAKE 1 TABLET BEFORE BREAKFAST ON EMPTY STOMACH ONCE A DAY FOR THYROID. 30 min before food (DAW) What changed:    how much to take  how to take this  when to take this  additional instructions   Vitamin D3 25 MCG (1000 UT) Caps Take 2,000 Units by mouth daily.            Discharge Care Instructions   (From admission, onward)         Start     Ordered   11/06/18 0000  Discharge wound care:    Comments:  Keep clean and dry   11/06/18 1148           Signed: Lahoma Crocker 11/06/2018, 11:55 AM

## 2018-11-06 NOTE — Discharge Instructions (Addendum)
Planning for °Recovery and °Going Home °Your Guide to Gynecologic Surgery   ° ° °In-Hospital Recovery Plan °Team Caring for You After Surgery °In addition to the nursing staff on the unit, the gynecological surgery team will °care for you. This team is led by your surgeon and includes medical students and a °physician assistant or nurse practitioner. There will be a physician in the °hospital 24 hours a day to tend to your needs. The students °report directly to your surgeon, who is the one overseeing all of your care. ° °Pain Relief After Surgery °Your pain will be assessed regularly on a scale from 0 to 10. Pain assessment is  °necessary to guide your pain relief. It is essential that you are able to take deep °breaths, cough and move. Prevention or early treatment of pain is far more °effective than trying to treat severe pain. Therefore, we have devised a specialized °regimen to stay ahead of your pain and use almost no narcotics, which can slow °down your recovery process. If you have an epidural catheter, you will receive a  °constant infusion of pain medication through your epidural. If you need °additional pain relief, you will be able to push a button to increase the medication in °your epidural. You will also be given acetaminophen and an ibuprofen-like °medication to keep your pain under control.  You can always ask for additional pain pills if you are not comfortable. In most cases an anesthesiologist with expertise in pain management will visit you every day and help design your pain management plan. ° °One Day After Surgery °Focus on drinking and walking. You will start drinking clear liquids after °surgery. The intravenous fluids will be stopped, and the catheter may be removed  °from your bladder. We expect you to get out of bed, with the nurses' or assistants' °help, sit in a chair for six hours and start to move about in the hallways. You will °also meet with a case manager to assess your discharge  needs, including home °nursing. Your physician may order home care to assist with your transition home.  °Home nursing visits, which are intermittent, help you get readjusted to home by °teaching treatments, monitoring medications, and performing clinical assessment °and reporting back to your physician. Other services may include therapy and °medical equipment; private duty services are also available. If you are going °"home" to a different address upon discharge, please alert us. A Home Care °Coordinator can visit with you while in the hospital to discuss your options. If you °have questions please speak with your case manager. If you °need rehabilitation at a facility, a social worker will assist with this. If you need °rehabilitation at a facility, a social worker will assist with this. °If your procedure was performed in a minimally invasive fashion, you will be discharged to home if your pain is well controlled and you are tolerating a regular diet.   ° ° °Two Days After Surgery °You will start eating a soft diet and change to a more solid diet as you feel up to it. The catheter from your bladder will be removed, if not already done so. If there is a dressing on your wound, it will be removed. The tubing will be disconnected from your IV. °We expect you to be out of bed for the majority of the day and walking at least three times in the hallway, with assistance as needed.  You may be discharged at this point if it is felt you are   ready.  ° °Three Days After Surgery °You continue to eat your low residue diet. You may be ready to go home if you are °drinking enough to keep yourself hydrated, your pain is well controlled, you are not °belching or nauseated, you are passing gas and you are able to get around on your °own. However, we will not discharge you from the hospital until we are sure you are °ready. ° °Discharge °Discharge time is at 10 a.m. You will need to make arrangements for someone to °accompany you  home. You will not be released without someone present. °Please keep in mind that we strive to get patients discharged as quickly as possible, °but there may be delays for a variety of reasons. °Complications That May Delay Discharge: °? Nausea and vomiting: It is very common to feel sick after your surgery. We give °you medication to reduce this. However, if you do feel sick, you should reduce the °amount you are taking by mouth. Small, frequent meals or drinks are best in this  °situation. As long as you can drink and keep yourself hydrated, the nausea will likely °pass.  °Ileus: Following surgery, the bowel can be sluggish, making it difficult for food °and gas to pass through the intestines. This is called an ileus. We have designed our °care program to do everything possible to reduce the likelihood of an ileus. If you do °develop an ileus, it usually only lasts two to three days. However, it may require a °small tube down the nose to decompress the stomach. The best way to avoid an °ileus is to reduce the amount of narcotic pain medications, get up as °much as possible after your surgery, and stimulate the bowel early after °surgery with small amounts of food and liquids. ° Wound infection: If a wound infection develops, this usually happens three °to ten days after surgery.  ° ° Urinary retention: This is if you are unable to urinate after the catheter °from your bladder is removed. The catheter may need to be reinserted until °you are able to urinate on your own. This can be caused by anesthesia, pain °medication and decreased activity.  ° ° °When you are preparing to go home, you will receive: ° Detailed discharge instructions, with information about your operation °and medications  ° ° All prescriptions for medications you need at home; prescriptions can be °filled while you are in the hospital if you would like  ° ° You may be prescribed Lovenox. Lovenox is used to reduce the risk of °developing a blood  clot after surgery. °An appointment to see your surgeon or provider one to two weeks after you °leave the hospital for follow-up  ° °After Discharge °Once you are discharged: °Call us at any time if you are worried about your recovery or if °you should have any questions. °During regular office hours, (8:30 a.m.-4:30 p.m.), and after hours call [phone]. ° °Call us immediately if: ° You have a fever higher than 100.4 degrees.  ° °Your wound is red, more painful or has drainage.  ° ° You are nauseated, vomiting or can't keep liquids down.  ° ° Your pain is worse and not able to be controlled with the regimen you were °sent home with.  ° ° If you are bleeding heavily or have a lot of fluid coming from your vagina. °If you are on narcotics, the goal is to wean you off of them. If you are running low °on supply and need more,   call the nurse a few days before you will run out.  °It is generally easier to reach someone between 8:30 a.m. - 4:30 p.m., so call °early if you think something is not right. A nurse or nurse practitioner is °available every day to answer your questions. After hours and on the °weekends, the calls go to the resident doctors in the hospital. It may take °longer for your phone call to be returned during this time. °If you have a true emergency, such as severe abdominal pain, chest pain, °shortness of breath or any other acute issues, call 911 and go to the local °emergency room. Have them contact our team once you are stable. ° °Concerns After Discharge °Bowel Function Following Your Surgery °Your bowels will take several weeks to settle down and may be unpredictable °at first. Your bowel movements may become loose, or you may be constipated. °For the vast number of patients, this will get back to normal with time. Make °sure you eat nutritious meals, drink plenty of fluids and take regular walks °during the first two weeks after your operation. °Your Guide to Gynecologic Surgery   ° °Abdominal  Pain °It is not unusual to suffer gripping pains (colic) during the first week following °removal of a portion of your bowel. This pain usually lasts for a few minutes °but goes away between spasms. If you have severe pain lasting more than one °to two hours or have a fever and feel generally unwell, you should contact us at  °the telephone contact numbers listed at the end of this packet. °Hysterectomy: °You should have pelvic rest for six (6) weeks or as specified by your doctor °after surgery. You should have nothing in the vagina (no tampons, douching, °intercourse, etc.,) during this time period. °If you have some vaginal spotting, this is normal. If you have heavy bleeding or  °a lot of fluid from your vagina, this is NOT normal and you should contact your °doctor's office or, if after hours, contact the doctor on call. ° °Diarrhea: Fiber and Imodium (Loperamide) °The first step to improving your frequent or loose stools is to bulk up the stool °with fiber. Metamucil is the most common type of fiber that is available at any °drug store. Start with 1 teaspoon mixed into food, like yogurt or oatmeal, in °the morning and evening. Try not to drink any fluid for one hour after you take °the fiber. This will allow the fiber to act like a sponge in your intestines, °soaking up all the excess water. Continue this for three to five days. °You may increase by 1 teaspoon every three to five days until the desired affect, °or you are at 1 tablespoon (3 teaspoons) twice a day. If this doesn't work, you °may try over-the-counter Loperamide, which is an antidiarrheal medication. You °may take one tablet in the morning and evening or 30 minutes before you °typically have diarrhea. You may take up to eight of these tablets daily. It is best °to discuss this with us prior to using this medication. If you have continuous °diarrhea and abdominal cramping, call [phone]. ° °Foley Catheter °Your surgeon may recommend you be  discharged home with a foley catheter °(bladder catheter) for 1 to 2 weeks. Typically this recommendation will be °made for patients undergoing surgery to the lower urinary tract. Before you leave the hospital, your nurse should outfit you with a clip on the inner thigh to secure the catheter to prevent pulling as well as a   small bag that can be easily worn on the upper leg under loose fitting pants and skirts. Your nurse will teach you how to exchange the large bag that typically comes with the catheter for the small bag. You may find it °convenient to attach the small bag when active during the day and then the °large bag when sleeping at night. If there is ever a point when you notice the catheter is not draining urine and youbegin to develop pain behind/above the pubic bone, you should report to the clinic or emergency room immediately as the catheter may be kinked or clogged. Kinking or clogging of the catheter prevents urine from draining °from your bladder. Urine will quickly build up in the bladder and can cause °severe pain as well as seriously disrupt healing if you have undergone surgery °on the lower urinary tract. Additionally, pulling on the catheter can result in °displacement of the balloon at the end of the catheter from inside of to outside  °of the bladder. This also results in severe pain and can cause bleeding. For °this reason, secure the catheter to the clip on your inner thigh at all times as the °clip prevents against pulling. ° °Wound Care °For the first few weeks following surgery, your wound may be slightly red and °uncomfortable. You may shower and let the soapy water wash over your incision. °Avoid soaking in the tub for one month following surgery or until °the wound is well healed. It will take the wound several months to °"soften." It is common to have bumpy areas in the wound near the belly  °button and at the ends of the incision. ° °If you have staples, these should be removed  when you are seen by your °surgeon at the follow-up appointment. You may have a glue-like material on °your incision. Do not pick at this. It will come off over time. It is the °surgical glue used in surgery to close your incision. You also have sutures °inside of you that will dissolve over time ° °Post-Surgery Diet °Attention to good nutrition after surgery is important to your recovery. If °you had no dietary restrictions prior to the surgery, you will have no °special dietary restrictions after the surgery. However, consuming enough °protein, calories, vitamins and minerals is necessary to support healing. °Some patients find their appetite is less than normal after surgery. In this °case, frequent small meals throughout the day may help. °It is not uncommon to lose 10 to 15 pounds after surgery. However, by the °fourth to fifth week, your weight loss should stabilize. °It is normal that certain foods taste different and certain smells may make °you nauseas. °Over time, the amount you can comfortably consume will gradually increase. °You should try to eat a balanced diet, which includes: ° Foods that are soft, moist, and easy to chew and swallow  ° ° Canned or soft-cooked fruits and vegetables  ° °Plenty of soft breads, rice, pasta, potatoes and other starchy foods (lowerfiber  °varieties may be tolerated better initially)  ° ° High-protein foods and beverages, such as meats, eggs, milk, cottage cheese  °or a supplemental nutrition drink like Boost or Ensure  ° ° Drink plenty of fluids-at least 8 to 10 cups per day. This includes water,  °fruit juice, Gatorade, teas/coffee and milk. Drinking plenty is especially °important if you have loose stools (diarrhea). ° ° Avoid drinking a lot of caffeine, since this may dehydrate you.  ° ° Avoid fried, greasy and highly seasoned   or spicy foods.    Avoid carbonated beverages in the first couple weeks.    Avoid raw fruits and vegetables.   Hobbies/Activities Walking  is encouraged after your surgery. You should plan to undertake regular exercise several times a day and gradually increase this during the four weeks following your operation until you are back to your normal level of activity. You may climb stairs. Don't do any heavy lifting greater than 10 pounds or contact sports for the first month after your surgery. Generally, you can return to hobbies and activities soon after your surgery. This will help you recover. It can take up to two to three months to fully recover. It is not unusual to be fatigued and require an afternoon nap for up to six to eight weeks following surgery. Your body is using this energy to heal your wounds. Set small goals for yourself and try to do a little more each day.  Work It is normal to return to work three to six weeks following your operation. If your job involves heavy manual work, then you should wait six weeks. However, you should check with your employer regarding rules, which may be relevant to your return to work. If you need a return-to-work form for your employer or disability papers, bring them to your follow-up appointment or fax them to our office at [phone].  Driving You may drive when you are off narcotics and pain-free enough to react quickly with your braking foot. For most patients, this occurs at one to four weeks following surgery.   Write down any questions you may have to ask your care team.  Important Contact Numbers: GYN Oncology Office: [phone]   Iron Deficiency Anemia, Adult Iron deficiency anemia is a condition in which the concentration of red blood cells or hemoglobin in the blood is below normal because of too little iron. Hemoglobin is a substance in red blood cells that carries oxygen to the body's tissues. When the concentration of red blood cells or hemoglobin is too low, not enough oxygen reaches these tissues. Iron deficiency anemia is usually long-lasting  (chronic) and it develops over time. It may or may not cause symptoms. It is a common type of anemia. What are the causes? This condition may be caused by:  Not enough iron in the diet.  Blood loss caused by bleeding in the intestine.  Blood loss from a gastrointestinal condition like Crohn disease.  Frequent blood draws, such as from blood donation.  Abnormal absorption in the gut.  Heavy menstrual periods in women.  Cancers of the gastrointestinal system, such as colon cancer. What are the signs or symptoms? Symptoms of this condition may include:  Fatigue.  Headache.  Pale skin, lips, and nail beds.  Poor appetite.  Weakness.  Shortness of breath.  Dizziness.  Cold hands and feet.  Fast or irregular heartbeat.  Irritability. This is more common in severe anemia.  Rapid breathing. This is more common in severe anemia. Mild anemia may not cause any symptoms. How is this diagnosed? This condition is diagnosed based on:  Your medical history.  A physical exam.  Blood tests. You may have additional tests to find the underlying cause of your anemia, such as:  Testing for blood in the stool (fecal occult blood test).  A procedure to see inside your colon and rectum (colonoscopy).  A procedure to see inside your esophagus and stomach (endoscopy).  A test in which cells are removed from bone marrow (  bone marrow aspiration) or fluid is removed from the bone marrow to be examined (biopsy). This is rarely needed. How is this treated? This condition is treated by correcting the cause of your iron deficiency. Treatment may involve:  Adding iron-rich foods to your diet.  Taking iron supplements. If you are pregnant or breastfeeding, you may need to take extra iron because your normal diet usually does not provide the amount of iron that you need.  Increasing vitamin C intake. Vitamin C helps your body absorb iron. Your health care provider may recommend that you  take iron supplements along with a glass of orange juice or a vitamin C supplement.  Medicines to make heavy menstrual flow lighter.  Surgery. You may need repeat blood tests to determine whether treatment is working. Depending on the underlying cause, the anemia should be corrected within 2 months of starting treatment. If the treatment does not seem to be working, you may need more testing. Follow these instructions at home: Medicines  Take over-the-counter and prescription medicines only as told by your health care provider. This includes iron supplements and vitamins.  If you cannot tolerate taking iron supplements by mouth, talk with your health care provider about taking them through a vein (intravenously) or an injection into a muscle.  For the best iron absorption, you should take iron supplements when your stomach is empty. If you cannot tolerate them on an empty stomach, you may need to take them with food.  Do not drink milk or take antacids at the same time as your iron supplements. Milk and antacids may interfere with iron absorption.  Iron supplements can cause constipation. To prevent constipation, include fiber in your diet as told by your health care provider. A stool softener may also be recommended. Eating and drinking   Talk with your health care provider before changing your diet. He or she may recommend that you eat foods that contain a lot of iron, such as: ? Liver. ? Low-fat (lean) beef. ? Breads and cereals that have iron added to them (are fortified). ? Eggs. ? Dried fruit. ? Dark green, leafy vegetables.  To help your body use the iron from iron-rich foods, eat those foods at the same time as fresh fruits and vegetables that are high in vitamin C. Foods that are high in vitamin C include: ? Oranges. ? Peppers. ? Tomatoes. ? Mangoes.  Drinkenoughfluid to keep your urine clear or pale yellow. General instructions  Return to your normal activities as  told by your health care provider. Ask your health care provider what activities are safe for you.  Practice good hygiene. Anemia can make you more prone to illness and infection.  Keep all follow-up visits as told by your health care provider. This is important. Contact a health care provider if:  You feel nauseous or you vomit.  You feel weak.  You have unexplained sweating.  You develop symptoms of constipation, such as: ? Having fewer than three bowel movements a week. ? Straining to have a bowel movement. ? Having stools that are hard, dry, or larger than normal. ? Feeling full or bloated. ? Pain in the lower abdomen. ? Not feeling relief after having a bowel movement. Get help right away if:  You faint. If this happens, do not drive yourself to the hospital. Call your local emergency services (911 in the [place]  You have chest pain.  You have shortness of breath that: ? Is severe. ? Gets worse with physical  activity.  You have a rapid heartbeat.  You become light-headed when getting up from a sitting or lying down position. This information is not intended to replace advice given to you by your health care provider. Make sure you discuss any questions you have with your health care provider. Document Released: 10/24/2000 Document Revised: 07/16/2016 Document Reviewed: 07/16/2016 Elsevier Interactive Patient Education  2019 [employer].

## 2018-11-06 NOTE — Progress Notes (Signed)
Assessment unchanged. Pt and husband verbalized understanding of dc instructions through teach back. Script x 1 at dc. Discharged via foot per request to front entrance accompanied by NT and husband.

## 2018-11-08 ENCOUNTER — Telehealth: Payer: Self-pay

## 2018-11-08 DIAGNOSIS — R300 Dysuria: Secondary | ICD-10-CM

## 2018-11-08 LAB — TYPE AND SCREEN
ABO/RH(D): A POS
Antibody Screen: NEGATIVE
UNIT DIVISION: 0
Unit division: 0
Unit division: 0
Unit division: 0

## 2018-11-08 LAB — BPAM RBC
BLOOD PRODUCT EXPIRATION DATE: 202001162359
Blood Product Expiration Date: 202001102359
Blood Product Expiration Date: 202001162359
Blood Product Expiration Date: 202001162359
UNIT TYPE AND RH: 6200
Unit Type and Rh: 6200
Unit Type and Rh: 6200
Unit Type and Rh: 6200

## 2018-11-08 NOTE — Addendum Note (Signed)
Addended by: Phineas Inches on: 11/08/2018 11:42 AM   Modules accepted: Orders

## 2018-11-08 NOTE — Telephone Encounter (Signed)
Outgoing call to pt per Joylene John NP regarding recent surg path results as "no cancer on final path, how is she?"  No answer, left VM to call our office back.

## 2018-11-08 NOTE — Telephone Encounter (Signed)
Pt returned call and I let her know below info regard surg path per Joylene John NP- pt voiced understanding.  Pt reports she has a little swelling at lymph nodes of neck and wondered if it had to do with getting blood transfusion. Notified Joylene John NP- not that we know of that transfusion would cause- encouraged pt to continue to monitor and call for fever, sore throat- can call us back or PCP.  Also pt reports "feels weird when I pee" and said foley usually have UTI afterwards.   Per Melissa NP, pt can come by cancer center lab so we can collect a urine for u/a and culture.  Pt said she will continue to monitor and if still having symptoms tomorrow - lab appt made for 1:30 pm 11/08/2018.  Told pt to keep in touch with our office and let us know how she is feeling, number provided and told her we will reach out to her as well tomorrow morning. Pt agreeable. Reports if she does have to have an antibiotic, she will most likely need Diflucan to prevent a yeast infection.

## 2018-11-09 ENCOUNTER — Other Ambulatory Visit: Payer: BLUE CROSS/BLUE SHIELD

## 2018-11-09 ENCOUNTER — Telehealth: Payer: Self-pay

## 2018-11-09 NOTE — Telephone Encounter (Signed)
Pt called stating that her lymph nodes are swollen around her throat.  She has a H/A. Temp 98.9. She is having some sweats during the night. Was on progesterone to help control bleeding prior to surgery. She would rather go to an urgent care near her home to be seen. Reviewed with Joylene John, NP.  She stated that the urgnt care would be fine.  She just needs to be sure they do a urinalysis since she was having some symptoms yesterday. Pt verbalized understanding. Cancelled the appointment today for Urinalysis.

## 2018-11-19 ENCOUNTER — Inpatient Hospital Stay: Payer: BLUE CROSS/BLUE SHIELD | Attending: Gynecologic Oncology | Admitting: Obstetrics

## 2018-11-19 ENCOUNTER — Encounter: Payer: Self-pay | Admitting: Obstetrics

## 2018-11-19 VITALS — BP 135/87 | HR 101 | Temp 98.1°F | Resp 20 | Ht 67.0 in | Wt 160.4 lb

## 2018-11-19 DIAGNOSIS — Z90710 Acquired absence of both cervix and uterus: Secondary | ICD-10-CM | POA: Insufficient documentation

## 2018-11-19 DIAGNOSIS — D259 Leiomyoma of uterus, unspecified: Secondary | ICD-10-CM | POA: Insufficient documentation

## 2018-11-19 DIAGNOSIS — Z90722 Acquired absence of ovaries, bilateral: Secondary | ICD-10-CM | POA: Insufficient documentation

## 2018-11-19 DIAGNOSIS — N858 Other specified noninflammatory disorders of uterus: Secondary | ICD-10-CM

## 2018-11-19 NOTE — Progress Notes (Signed)
S: Patient returns for early postop check and path review. She is doing well overall. A little constipation. She gets some cramps when having BM. Denies nausea/vomiting. She was taken to the OR for TAH/bilateral salpingectomy 11/04/18. Final pathology showed benign disease. They called the uterine mass a benign hemorrhagic cyst within the myometrium. Clinically it seemed c/w a fibroid. There were no atypia or malignant features seen.  O:  Vitals:   11/19/18 0930  BP: 135/87  Pulse: (!) 101  Resp: 20  Temp: 98.1 F (36.7 C)  SpO2: 100%   General :  Well developed, 44 y.o., female in no apparent distress HEENT:  Normocephalic/atraumatic, symmetric, EOMI, eyelids normal Neck:   No visible masses.  Respiratory:  Respirations unlabored, no use of accessory muscles CV:   Deferred Breast:  Deferred Musculoskeletal: Normal muscle strength. Abdomen:  Vertical wound CDI No visible masses or protrusion Extremities:  No visible edema or deformities Skin:   Normal inspection Neuro/Psych:  No focal motor deficit, no abnormal mental status. Normal gait. Normal affect. Alert and oriented to person, place, and time   A: Postop from TAH/BS Plan: She was given a copy of the operative and pathology reports today. We discussed continued activity restrictions Return in ~ 1 month for vaginal cuff check and dispo back to Dr. Royston Sinner  Cc: Cynthia Garfinkel, MD (Referring Ob/Gyn) McLean-Scocuzza, Nino Glow, MD (PCP)

## 2018-11-19 NOTE — Patient Instructions (Signed)
Return in one month for a vaginal cuff check. Continued activity restrictions as discussed

## 2018-12-03 ENCOUNTER — Encounter: Payer: Self-pay | Admitting: Internal Medicine

## 2018-12-03 ENCOUNTER — Ambulatory Visit (INDEPENDENT_AMBULATORY_CARE_PROVIDER_SITE_OTHER): Payer: BLUE CROSS/BLUE SHIELD | Admitting: Internal Medicine

## 2018-12-03 VITALS — BP 124/80 | HR 94 | Temp 98.5°F | Resp 18 | Ht 67.0 in | Wt 157.5 lb

## 2018-12-03 DIAGNOSIS — E063 Autoimmune thyroiditis: Secondary | ICD-10-CM | POA: Diagnosis not present

## 2018-12-03 DIAGNOSIS — K5909 Other constipation: Secondary | ICD-10-CM

## 2018-12-03 DIAGNOSIS — E039 Hypothyroidism, unspecified: Secondary | ICD-10-CM

## 2018-12-03 NOTE — Patient Instructions (Addendum)
Results for Cynthia Burke, Cynthia Burke" (MRN [id number]) as of 12/03/2018 09:09  Ref. Range 09/07/2018 08:37  TSH Latest Ref Range: 0.35 - 4.50 uIU/mL 0.80  Triiodothyronine,Free,Serum Latest Ref Range: 2.3 - 4.2 pg/mL 3.3  T4,Free(Direct) Latest Ref Range: 0.60 - 1.60 ng/dL 1.00    Linaclotide oral capsules What is this medicine? LINACLOTIDE (lin a KLOE tide) is used to treat irritable bowel syndrome (IBS) with constipation as the main problem. It may also be used for relief of chronic constipation. This medicine may be used for other purposes; ask your health care provider or pharmacist if you have questions. COMMON BRAND NAME(S): Linzess What should I tell my health care provider before I take this medicine? They need to know if you have any of these conditions: -history of stool (fecal) impaction -now have diarrhea or have diarrhea often -other medical condition -stomach or intestinal disease, including bowel obstruction or abdominal adhesions -an unusual or allergic reaction to linaclotide, other medicines, foods, dyes, or preservatives -pregnant or trying to get pregnant -breast-feeding How should I use this medicine? Take this medicine by mouth with a glass of water. Follow the directions on the prescription label. Do not cut, crush or chew this medicine. Take on an empty stomach, at least 30 minutes before your first meal of the day. Take your medicine at regular intervals. Do not take your medicine more often than directed. Do not stop taking except on your doctor's advice. A special MedGuide will be given to you by the pharmacist with each prescription and refill. Be sure to read this information carefully each time. Talk to your pediatrician regarding the use of this medicine in children. This medicine is not approved for use in children. Overdosage: If you think you have taken too much of this medicine contact a poison control center or emergency room at once. NOTE: This  medicine is only for you. Do not share this medicine with others. What if I miss a dose? If you miss a dose, just skip that dose. Wait until your next dose, and take only that dose. Do not take double or extra doses. What may interact with this medicine? -certain medicines for bowel problems or bladder incontinence (these can cause constipation) This list may not describe all possible interactions. Give your health care provider a list of all the medicines, herbs, non-prescription drugs, or dietary supplements you use. Also tell them if you smoke, drink alcohol, or use illegal drugs. Some items may interact with your medicine. What should I watch for while using this medicine? Visit your doctor for regular check ups. Tell your doctor if your symptoms do not get better or if they get worse. Diarrhea is a common side effect of this medicine. It often begins within 2 weeks of starting this medicine. Stop taking this medicine and call your doctor if you get severe diarrhea. Stop taking this medicine and call your doctor or go to the nearest hospital emergency room right away if you develop unusual or severe stomach-area (abdominal) pain, especially if you also have bright red, bloody stools or black stools that look like tar. What side effects may I notice from receiving this medicine? Side effects that you should report to your doctor or health care professional as soon as possible: -allergic reactions like skin rash, itching or hives, swelling of the face, lips, or tongue -black, tarry stools -bloody or watery diarrhea -new or worsening stomach pain -severe or prolonged diarrhea Side effects that usually do not require medical attention (  report to your doctor or health care professional if they continue or are bothersome): -bloating -gas -loose stools This list may not describe all possible side effects. Call your doctor for medical advice about side effects. You may report side effects to FDA at  1-800-FDA-1088. Where should I keep my medicine? Keep out of the reach of children. Store at room temperature between 20 and 25 degrees C (68 and 77 degrees F). Keep this medicine in the original container. Keep tightly closed in a dry place. Do not remove the desiccant packet from the bottle, it helps to protect your medicine from moisture. Throw away any unused medicine after the expiration date. NOTE: This sheet is a summary. It may not cover all possible information. If you have questions about this medicine, talk to your doctor, pharmacist, or health care provider.  2019 [employer] Standard (2015-11-29 12:17:04)

## 2018-12-03 NOTE — Progress Notes (Signed)
Chief Complaint  Patient presents with  . Follow-up    Physical   F/u  1. Chronic constipation taking 3 stool softners 2x per day never had dx of IBS but thinks so  2. S/p total ab hysterectomy 11/04/18 neg pathology uterus, cervix, fallopian tubes out still with ovaries f/u gyn onc 12/20/18 and ob/gyn 12/22/18 with acute blood loss anemia cant tolerate iron due to constipation  3. Hypothyroidism/hashimotos appt Dr. Gabriel Carina 12/29/2018   Review of Systems  Constitutional: Negative for weight loss.  HENT: Negative for hearing loss.   Eyes: Negative for blurred vision.  Respiratory: Negative for shortness of breath.   Cardiovascular: Negative for chest pain.  Gastrointestinal: Positive for constipation. Negative for abdominal pain.  Musculoskeletal: Negative for falls.  Skin: Negative for rash.  Neurological: Negative for headaches.  Psychiatric/Behavioral: Negative for depression.   Past Medical History:  Diagnosis Date  . ADD (attention deficit disorder)   . Allergy   . Anemia   . Asthma   . Depression   . Hashimoto's disease   . Hashimoto's thyroiditis   . History of chicken pox   . History of UTI   . Hyperlipidemia   . Premature ovarian insufficiency    [facility] 29 Dr. Hughie Closs   . Seizures ([place])    medication induced wellbutrin   Past Surgical History:  Procedure Laterality Date  . ABDOMINAL HYSTERECTOMY     Dr. Gerarda Fraction 11-04-18  . DILATION AND CURETTAGE OF UTERUS     2015 and 2018   . HYSTERECTOMY ABDOMINAL WITH SALPINGECTOMY Bilateral 11/04/2018   Procedure: HYSTERECTOMY ABDOMINAL WITH BILATERAL SALPINGECTOMY, Exploratory laparotomy;  Surgeon: Isabel Caprice, MD;  Location: [facility];  Service: Gynecology;  Laterality: Bilateral;  . MYOMECTOMY     2015    Family History  Problem Relation Age of Onset  . Rheum arthritis Mother   . Hashimoto's thyroiditis Mother   . Hypertension Mother   . Hyperlipidemia Mother   . Arthritis Mother   . Asthma Mother   . Depression  Mother   . Kidney disease Mother   . Miscarriages / [place] Mother   . Diabetes Mellitus II Father   . Hypothyroidism Father   . Hypertension Father   . Coronary artery disease Father   . Hyperlipidemia Father   . Arthritis Father   . COPD Father   . Depression Father   . Diabetes Father   . Heart disease Father   . Kidney disease Father   . Bladder Cancer Father   . Prostate cancer Father   . Stroke Maternal Grandmother   . Heart disease Maternal Grandmother   . Breast cancer Paternal Grandmother 63  . Early death Paternal Grandmother   . Cancer Paternal Grandmother        breast died   . Miscarriages / Stillbirths Paternal Grandmother   . Learning disabilities Brother   . Hypertension Brother   . Depression Brother   . Alcohol abuse Maternal Grandfather   . Early death Maternal Grandfather   . Heart disease Paternal Grandfather    Social History   Socioeconomic History  . Marital status: Married    Spouse name: Not on file  . Number of children: Not on file  . Years of education: Not on file  . Highest education level: Not on file  Occupational History  . Not on file  Social Needs  . Financial resource strain: Not on file  . Food insecurity:    Worry: Not on file  Inability: Not on file  . Transportation needs:    Medical: Not on file    Non-medical: Not on file  Tobacco Use  . Smoking status: Never Smoker  . Smokeless tobacco: Never Used  Substance and Sexual Activity  . Alcohol use: No  . Drug use: No  . Sexual activity: Yes    Birth control/protection: None  Lifestyle  . Physical activity:    Days per week: Not on file    Minutes per session: Not on file  . Stress: Not on file  Relationships  . Social connections:    Talks on phone: Not on file    Gets together: Not on file    Attends religious service: Not on file    Active member of club or organization: Not on file    Attends meetings of clubs or organizations: Not on file     Relationship status: Not on file  . Intimate partner violence:    Fear of current or ex partner: Not on file    Emotionally abused: Not on file    Physically abused: Not on file    Forced sexual activity: Not on file  Other Topics Concern  . Not on file  Social History Narrative   Married    PT   No kids    Current Meds  Medication Sig  . albuterol (PROVENTIL HFA;VENTOLIN HFA) 108 (90 Base) MCG/ACT inhaler Inhale 1-2 puffs into the lungs every 6 (six) hours as needed for wheezing or shortness of breath.   Marland Kitchen azelastine (ASTELIN) 0.1 % nasal spray Place 2 sprays into both nostrils 2 (two) times daily as needed for allergies.   . Cholecalciferol (VITAMIN D3) 25 MCG (1000 UT) CAPS Take 2,000 Units by mouth daily.  . Coenzyme Q10 (COQ10) 100 MG CAPS Take 900 mg by mouth 2 (two) times daily.   Marland Kitchen dextroamphetamine (DEXEDRINE SPANSULE) 10 MG 24 hr capsule Take 20 mg by mouth 2 (two) times daily.   Marland Kitchen FLUoxetine (PROZAC) 40 MG capsule Take 40 mg by mouth daily.   Marland Kitchen L-Methylfolate 15 MG TABS Take 15 mg by mouth daily.   . montelukast (SINGULAIR) 10 MG tablet Take 1 tablet (10 mg total) by mouth at bedtime.  . Multiple Vitamins-Minerals (MULTIVITAMIN ADULT) CHEW Chew 2 each by mouth daily.  Marland Kitchen UNITHROID 125 MCG tablet TAKE 1 TABLET BEFORE BREAKFAST ON EMPTY STOMACH ONCE A DAY FOR THYROID. 30 min before food (DAW) (Patient taking differently: Take 125 mcg by mouth daily before breakfast. )   Allergies  Allergen Reactions  . Wellbutrin [Bupropion] Other (See Comments)    Seizures    . Latex Other (See Comments)    Itching, sneezing  . Other Other (See Comments)    Tree pollen     Recent Results (from the past 2160 hour(s))  Hepatitis B surface antibody,quantitative     Status: None   Collection Time: 09/07/18  8:37 AM  Result Value Ref Range   Hepatitis B-Post >1,000 > OR = 10 mIU/mL    Comment: . Patient has immunity to hepatitis B virus. . For additional information, please refer  to http://education.questdiagn[facility].com/faq/FAQ105 (This link is being provided for informational/ educational purposes only).   Vitamin D (25 hydroxy)     Status: None   Collection Time: 09/07/18  8:37 AM  Result Value Ref Range   VITD 39.17 30.00 - 100.00 ng/mL  Measles/Mumps/Rubella Immunity     Status: None   Collection Time: 09/07/18  8:37 AM  Result Value Ref Range   Rubeola IgG >300.00 AU/mL    Comment: AU/mL            Interpretation -----            -------------- <25.00           Negative 25.00-29.99      Equivocal >29.99           Positive . A positive result indicates that the patient has antibody to measles virus. It does not differentiate  between an active or past infection. The clinical  diagnosis must be interpreted in conjunction with  clinical signs and symptoms of the patient.    Mumps IgG 195.00 AU/mL    Comment:  AU/mL           Interpretation -------         ---------------- <9.00             Negative 9.00-10.99        Equivocal >10.99            Positive A positive result indicates that the patient has  antibody to mumps virus. It does not differentiate between an  active or past infection. The clinical diagnosis must be interpreted in conjunction with clinical signs and symptoms of the patient. .    Rubella 17.00 index    Comment:     Index            Interpretation     -----            --------------       <0.90            Not consistent with Immunity     0.90-0.99        Equivocal     > or = 1.00      Consistent with Immunity  . The presence of rubella IgG antibody suggests  immunization or past or current infection with rubella virus.   T3, free     Status: None   Collection Time: 09/07/18  8:37 AM  Result Value Ref Range   T3, Free 3.3 2.3 - 4.2 pg/mL  T4, free     Status: None   Collection Time: 09/07/18  8:37 AM  Result Value Ref Range   Free T4 1.00 0.60 - 1.60 ng/dL    Comment: Specimens from patients who are undergoing biotin  therapy and /or ingesting biotin supplements may contain high levels of biotin.  The higher biotin concentration in these specimens interferes with this Free T4 assay.  Specimens that contain high levels  of biotin may cause false high results for this Free T4 assay.  Please interpret results in light of the total clinical presentation of the patient.    TSH     Status: None   Collection Time: 09/07/18  8:37 AM  Result Value Ref Range   TSH 0.80 0.35 - 4.50 uIU/mL  Lipid panel     Status: Abnormal   Collection Time: 09/07/18  8:37 AM  Result Value Ref Range   Cholesterol 202 (H) 0 - 200 mg/dL    Comment: ATP III Classification       Desirable:  < 200 mg/dL               Borderline High:  200 - 239 mg/dL          High:  > = 240 mg/dL   Triglycerides 96.0 0.0 - 149.0 mg/dL    Comment:  Normal:  <150 mg/dLBorderline High:  150 - 199 mg/dL   HDL 50.60 >39.00 mg/dL   VLDL 19.2 0.0 - 40.0 mg/dL   LDL Cholesterol 132 (H) 0 - 99 mg/dL   Total CHOL/HDL Ratio 4     Comment:                Men          Women1/2 Average Risk     3.4          3.3Average Risk          5.0          4.42X Average Risk          9.6          7.13X Average Risk          15.0          11.0                       NonHDL 151.41     Comment: NOTE:  Non-HDL goal should be 30 mg/dL higher than patient's LDL goal (i.e. LDL goal of < 70 mg/dL, would have non-HDL goal of < 100 mg/dL)  CBC with Differential/Platelet     Status: None   Collection Time: 09/07/18  8:37 AM  Result Value Ref Range   WBC 4.2 4.0 - 10.5 K/uL   RBC 4.18 3.87 - 5.11 Mil/uL   Hemoglobin 13.0 12.0 - 15.0 g/dL   HCT 37.7 36.0 - 46.0 %   MCV 90.2 78.0 - 100.0 fl   MCHC 34.5 30.0 - 36.0 g/dL   RDW 13.3 11.5 - 15.5 %   Platelets 390.0 150.0 - 400.0 K/uL   Neutrophils Relative % 60.6 43.0 - 77.0 %   Lymphocytes Relative 26.0 12.0 - 46.0 %   Monocytes Relative 9.5 3.0 - 12.0 %   Eosinophils Relative 3.1 0.0 - 5.0 %   Basophils Relative 0.8 0.0 - 3.0 %   Neutro  Abs 2.5 1.4 - 7.7 K/uL   Lymphs Abs 1.1 0.7 - 4.0 K/uL   Monocytes Absolute 0.4 0.1 - 1.0 K/uL   Eosinophils Absolute 0.1 0.0 - 0.7 K/uL   Basophils Absolute 0.0 0.0 - 0.1 K/uL  Comprehensive metabolic panel     Status: None   Collection Time: 09/07/18  8:37 AM  Result Value Ref Range   Sodium 137 135 - 145 mEq/L   Potassium 4.0 3.5 - 5.1 mEq/L   Chloride 102 96 - 112 mEq/L   CO2 30 19 - 32 mEq/L   Glucose, Bld 89 70 - 99 mg/dL   BUN 14 6 - 23 mg/dL   Creatinine, Ser 0.88 0.40 - 1.20 mg/dL   Total Bilirubin 0.5 0.2 - 1.2 mg/dL   Alkaline Phosphatase 41 39 - 117 U/L   AST 16 0 - 37 U/L   ALT 13 0 - 35 U/L   Total Protein 6.7 6.0 - 8.3 g/dL   Albumin 4.3 3.5 - 5.2 g/dL   Calcium 9.0 8.4 - 10.5 mg/dL   GFR 75.43 >60.00 mL/min  Urinalysis, Routine w reflex microscopic     Status: Abnormal   Collection Time: 09/08/18  9:06 AM  Result Value Ref Range   Specific Gravity, UA 1.008 1.005 - 1.030   pH, UA 8.0 (H) 5.0 - 7.5   Color, UA Yellow Yellow   Appearance Ur Clear Clear   Leukocytes, UA Negative Negative   Protein, UA  Negative Negative/Trace   Glucose, UA Negative Negative   Ketones, UA Negative Negative   RBC, UA Negative Negative   Bilirubin, UA Negative Negative   Urobilinogen, Ur 0.2 0.2 - 1.0 mg/dL   Nitrite, UA Negative Negative   Microscopic Examination Comment     Comment: Microscopic not indicated and not performed.  CBC with Differential (Cancer Center Only)     Status: Abnormal   Collection Time: 10/28/18 10:31 AM  Result Value Ref Range   WBC Count 5.5 4.0 - 10.5 K/uL   RBC 3.80 (L) 3.87 - 5.11 MIL/uL   Hemoglobin 11.6 (L) 12.0 - 15.0 g/dL   HCT 35.0 (L) 36.0 - 46.0 %   MCV 92.1 80.0 - 100.0 fL   MCH 30.5 26.0 - 34.0 pg   MCHC 33.1 30.0 - 36.0 g/dL   RDW 13.3 11.5 - 15.5 %   Platelet Count 372 150 - 400 K/uL   nRBC 0.0 0.0 - 0.2 %   Neutrophils Relative % 68 %   Neutro Abs 3.7 1.7 - 7.7 K/uL   Lymphocytes Relative 21 %   Lymphs Abs 1.1 0.7 - 4.0 K/uL    Monocytes Relative 9 %   Monocytes Absolute 0.5 0.1 - 1.0 K/uL   Eosinophils Relative 1 %   Eosinophils Absolute 0.1 0.0 - 0.5 K/uL   Basophils Relative 1 %   Basophils Absolute 0.1 0.0 - 0.1 K/uL   Immature Granulocytes 0 %   Abs Immature Granulocytes 0.02 0.00 - 0.07 K/uL    Comment: Performed at [facility], [address] [place], [place] [address]  Basic metabolic panel     Status: None   Collection Time: 10/28/18 10:31 AM  Result Value Ref Range   Sodium 139 135 - 145 mmol/L   Potassium 4.0 3.5 - 5.1 mmol/L   Chloride 105 98 - 111 mmol/L   CO2 25 22 - 32 mmol/L   Glucose, Bld 98 70 - 99 mg/dL   BUN 12 6 - 20 mg/dL   Creatinine, Ser 0.93 0.44 - 1.00 mg/dL   Calcium 9.9 8.9 - 10.3 mg/dL   GFR calc non Af Amer >60 >60 mL/min   GFR calc Af Amer >60 >60 mL/min   Anion gap 9 5 - 15    Comment: Performed at [facility], [place] [address] [place], [place] [address]  Pregnancy, urine     Status: None   Collection Time: 10/28/18 10:37 AM  Result Value Ref Range   Preg Test, Ur NEGATIVE NEGATIVE    Comment:        THE SENSITIVITY OF THIS METHODOLOGY IS >20 mIU/mL. Performed at [facility], [place] [address], [place], [place] [address]   CBC     Status: Abnormal   Collection Time: 11/01/18 11:23 AM  Result Value Ref Range   WBC 3.9 (L) 4.0 - 10.5 K/uL   RBC 3.64 (L) 3.87 - 5.11 MIL/uL   Hemoglobin 11.0 (L) 12.0 - 15.0 g/dL   HCT 34.2 (L) 36.0 - 46.0 %   MCV 94.0 80.0 - 100.0 fL   MCH 30.2 26.0 - 34.0 pg   MCHC 32.2 30.0 - 36.0 g/dL   RDW 13.3 11.5 - 15.5 %   Platelets 409 (H) 150 - 400 K/uL   nRBC 0.0 0.0 - 0.2 %    Comment: Performed at [facility], [place] [address], [place], [place] [address]  Comprehensive metabolic panel  Status: Abnormal   Collection Time: 11/01/18 11:23 AM  Result Value Ref Range   Sodium 136 135 - 145 mmol/L   Potassium 3.8 3.5 - 5.1 mmol/L   Chloride 109 98 -  111 mmol/L   CO2 22 22 - 32 mmol/L   Glucose, Bld 111 (H) 70 - 99 mg/dL   BUN 12 6 - 20 mg/dL   Creatinine, Ser 0.85 0.44 - 1.00 mg/dL   Calcium 8.7 (L) 8.9 - 10.3 mg/dL   Total Protein 7.1 6.5 - 8.1 g/dL   Albumin 4.2 3.5 - 5.0 g/dL   AST 23 15 - 41 U/L   ALT 17 0 - 44 U/L   Alkaline Phosphatase 39 38 - 126 U/L   Total Bilirubin 0.2 (L) 0.3 - 1.2 mg/dL   GFR calc non Af Amer >60 >60 mL/min   GFR calc Af Amer >60 >60 mL/min   Anion gap 5 5 - 15    Comment: Performed at [facility], [place] [address] [place], [place] [address]  Type and screen     Status: None   Collection Time: 11/01/18 11:23 AM  Result Value Ref Range   ABO/RH(D) A POS    Antibody Screen NEG    Sample Expiration 11/07/2018    Extend sample reason NO TRANSFUSIONS OR PREGNANCY IN THE PAST 3 MONTHS    Unit Number [id number]    Blood Component Type RED CELLS,LR    Unit division 00    Status of Unit REL FROM [facility]    Transfusion Status OK TO TRANSFUSE    Crossmatch Result Compatible    Unit Number [id number]    Blood Component Type RED CELLS,LR    Unit division 00    Status of Unit REL FROM [facility]    Transfusion Status OK TO TRANSFUSE    Crossmatch Result Compatible    Unit Number [id number]    Blood Component Type RED CELLS,LR    Unit division 00    Status of Unit REL FROM [facility]    Transfusion Status OK TO TRANSFUSE    Crossmatch Result Compatible    Unit Number [id number]    Blood Component Type RED CELLS,LR    Unit division 00    Status of Unit REL FROM [facility]    Transfusion Status OK TO TRANSFUSE    Crossmatch Result      Compatible Performed at [facility], [place] [address], [place], [place] [address]   Urinalysis, Routine w reflex microscopic     Status: None   Collection Time: 11/01/18 11:23 AM  Result Value Ref Range   Color, Urine YELLOW YELLOW   APPearance CLEAR CLEAR   Specific Gravity, Urine 1.005 1.005 - 1.030   pH 8.0 5.0 - 8.0   Glucose,  UA NEGATIVE NEGATIVE mg/dL   Hgb urine dipstick NEGATIVE NEGATIVE   Bilirubin Urine NEGATIVE NEGATIVE   Ketones, ur NEGATIVE NEGATIVE mg/dL   Protein, ur NEGATIVE NEGATIVE mg/dL   Nitrite NEGATIVE NEGATIVE   Leukocytes, UA NEGATIVE NEGATIVE    Comment: Performed at [place] [address], [place], [place] [address]  ABO/Rh     Status: None   Collection Time: 11/01/18 11:23 AM  Result Value Ref Range   ABO/RH(D)      A POS Performed at [facility], [place] [address], [place], [place] [address]   BPAM RBC     Status: None   Collection Time: 11/01/18 11:23 AM  Result  Value Ref Range   Blood Product Unit Number [id number]    Unit Type and Rh 6200    Blood Product Expiration Date 308657846962    Blood Product Unit Number [id number]    Unit Type and Rh 2725    Blood Product Expiration Date 366440347425    Blood Product Unit Number [id number]    Unit Type and Rh 6200    Blood Product Expiration Date 518841660630    Blood Product Unit Number [id number]    Unit Type and Rh 6200    Blood Product Expiration Date 220254270623   CBC     Status: Abnormal   Collection Time: 11/05/18  5:01 AM  Result Value Ref Range   WBC 10.3 4.0 - 10.5 K/uL   RBC 3.25 (L) 3.87 - 5.11 MIL/uL   Hemoglobin 9.8 (L) 12.0 - 15.0 g/dL   HCT 31.0 (L) 36.0 - 46.0 %   MCV 95.4 80.0 - 100.0 fL   MCH 30.2 26.0 - 34.0 pg   MCHC 31.6 30.0 - 36.0 g/dL   RDW 13.0 11.5 - 15.5 %   Platelets 310 150 - 400 K/uL   nRBC 0.0 0.0 - 0.2 %    Comment: Performed at [facility], [place] [address], [place], [place] [address]  Basic metabolic panel     Status: Abnormal   Collection Time: 11/05/18  5:01 AM  Result Value Ref Range   Sodium 138 135 - 145 mmol/L   Potassium 4.4 3.5 - 5.1 mmol/L   Chloride 105 98 - 111 mmol/L   CO2 25 22 - 32 mmol/L   Glucose, Bld 117 (H) 70 - 99 mg/dL   BUN 11 6 - 20 mg/dL   Creatinine, Ser 0.86 0.44 - 1.00 mg/dL   Calcium  8.5 (L) 8.9 - 10.3 mg/dL   GFR calc non Af Amer >60 >60 mL/min   GFR calc Af Amer >60 >60 mL/min   Anion gap 8 5 - 15    Comment: Performed at [facility], [place] [address], [place], [place] [address]  CBC     Status: Abnormal   Collection Time: 11/06/18  4:15 AM  Result Value Ref Range   WBC 8.1 4.0 - 10.5 K/uL   RBC 2.94 (L) 3.87 - 5.11 MIL/uL   Hemoglobin 8.9 (L) 12.0 - 15.0 g/dL   HCT 28.3 (L) 36.0 - 46.0 %   MCV 96.3 80.0 - 100.0 fL   MCH 30.3 26.0 - 34.0 pg   MCHC 31.4 30.0 - 36.0 g/dL   RDW 13.2 11.5 - 15.5 %   Platelets 270 150 - 400 K/uL   nRBC 0.0 0.0 - 0.2 %    Comment: Performed at [facility], [place] [address], [place], [place] [address]   Objective  Body mass index is 24.67 kg/m. Wt Readings from Last 3 Encounters:  12/03/18 157 lb 8 oz (71.4 kg)  11/19/18 160 lb 6.4 oz (72.8 kg)  11/04/18 163 lb 2.3 oz (74 kg)   Temp Readings from Last 3 Encounters:  12/03/18 98.5 F (36.9 C) (Oral)  11/19/18 98.1 F (36.7 C) (Oral)  11/05/18 97.7 F (36.5 C) (Oral)   BP Readings from Last 3 Encounters:  12/03/18 124/80  11/19/18 135/87  11/06/18 120/79   Pulse Readings from Last 3 Encounters:  12/03/18 94  11/19/18 (!) 101  11/06/18 69    Physical Exam Vitals signs and nursing note reviewed.  Constitutional:      Appearance:  Normal appearance. She is well-developed, well-groomed and normal weight.  HENT:     Head: Normocephalic and atraumatic.     Mouth/Throat:     Mouth: Mucous membranes are moist.     Pharynx: Oropharynx is clear.  Eyes:     Conjunctiva/sclera: Conjunctivae normal.     Pupils: Pupils are equal, round, and reactive to light.  Cardiovascular:     Rate and Rhythm: Normal rate and regular rhythm.     Heart sounds: Normal heart sounds.  Pulmonary:     Effort: Pulmonary effort is normal.     Breath sounds: Normal breath sounds.  Abdominal:    Skin:    General: Skin is warm and dry.  Neurological:      General: No focal deficit present.     Mental Status: She is alert and oriented to person, place, and time. Mental status is at baseline.     Gait: Gait normal.  Psychiatric:        Attention and Perception: Attention and perception normal.        Mood and Affect: Mood and affect normal.        Speech: Speech normal.        Behavior: Behavior normal. Behavior is cooperative.        Thought Content: Thought content normal.        Cognition and Memory: Cognition and memory normal.        Judgment: Judgment normal.     Assessment   1. Chronic constipation vs ibs-c  2. S/p total hysterctomy ovaries intact doing well with likely acute blood loss anemia 3. Hashimotos/hypothyroidism  4. HM Plan  1. Given samples x 2 linzess 290 start with 1/2 pill call back if wants Rx  2. F/u ob/gyn and gyn onc  Cant tolerate iron  3. F/u Dr. Gabriel Carina 12/29/2018  4.  utd flu and Tdap  Referred mammogram  -prior mammogram 10/03/13 normal  [facility] [place]  Never smoker  Pap 04/16/17 neg pap neg HPV   Right thyroid FNA 76/73/41 benign follicular cells, rare oncocytic metaplastic cells and histiocytes consistent with MNG (benign thyriod nodules) neg malignancy. Thyroid US 09/12/13 thyroiditis, solitary nodule right thyroid increased in size   Provider: Dr. Olivia Mackie McLean-Scocuzza-Internal Medicine

## 2018-12-08 ENCOUNTER — Encounter: Payer: Self-pay | Admitting: Internal Medicine

## 2018-12-08 ENCOUNTER — Other Ambulatory Visit: Payer: Self-pay | Admitting: Internal Medicine

## 2018-12-08 DIAGNOSIS — K589 Irritable bowel syndrome without diarrhea: Secondary | ICD-10-CM

## 2018-12-08 MED ORDER — LINACLOTIDE 290 MCG PO CAPS: 290.0000 ug | ORAL_CAPSULE | Freq: Every day | ORAL | 3 refills | Status: DC

## 2018-12-16 ENCOUNTER — Encounter: Payer: Self-pay | Admitting: Internal Medicine

## 2018-12-20 ENCOUNTER — Inpatient Hospital Stay: Payer: BLUE CROSS/BLUE SHIELD | Attending: Gynecologic Oncology | Admitting: Obstetrics

## 2018-12-20 ENCOUNTER — Encounter: Payer: Self-pay | Admitting: Obstetrics

## 2018-12-20 VITALS — BP 115/79 | Temp 98.7°F | Resp 20 | Ht 67.0 in | Wt 155.3 lb

## 2018-12-20 DIAGNOSIS — N858 Other specified noninflammatory disorders of uterus: Secondary | ICD-10-CM

## 2018-12-20 DIAGNOSIS — Z90710 Acquired absence of both cervix and uterus: Secondary | ICD-10-CM

## 2018-12-20 DIAGNOSIS — D259 Leiomyoma of uterus, unspecified: Secondary | ICD-10-CM

## 2018-12-20 DIAGNOSIS — Z90722 Acquired absence of ovaries, bilateral: Secondary | ICD-10-CM

## 2018-12-20 NOTE — Patient Instructions (Signed)
Return to routine activity 12/31/18 Followup with Dr. Royston Sinner for annual well woman exams

## 2018-12-20 NOTE — Progress Notes (Signed)
S: Patient returns for a final postop check   We already had a visit for path review.  She was taken to the OR for TAH/bilateral salpingectomy 11/04/18. Final pathology showed benign disease. They called the uterine mass a benign hemorrhagic cyst within the myometrium. Clinically it seemed c/w a fibroid. There were no atypia or malignant features seen.  No complaints although she has already had intercourse. No bleeding. Things felt different. She is asking about return to work in 2 weeks.  O:  Vitals:   12/20/18 1524  BP: 115/79  Resp: 20  Temp: 98.7 F (37.1 C)  SpO2: 100%    General :  Well developed, 43 y.o., female in no apparent distress HEENT:  Normocephalic/atraumatic, symmetric, EOMI, eyelids normal Neck:   No visible masses.  Respiratory:  Respirations unlabored, no use of accessory muscles CV:   Deferred Breast:  Deferred Musculoskeletal: Normal muscle strength. Abdomen:  Vertical wound healed. No visible masses or protrusion Extremities:  No visible edema or deformities Skin:   Normal inspection Neuro/Psych:  No focal motor deficit, no abnormal mental status. Normal gait. Normal affect. Alert and oriented to person, place, and time  Genitourinary: Vaginal cuff intact, small ?granulation tissue at midline with mild friability.   A: Postop from TAH/BS Plan: She was given a copy of the operative and pathology reports last visit We discussed continued activity restrictions Reassurance given for intercourse, but to hold off for another 2 weeks. Disposition back to Dr. Royston Sinner for annual well-woman exams.  Cc: Lucillie Garfinkel, MD (Referring Ob/Gyn) McLean-Scocuzza, Nino Glow, MD (PCP)

## 2018-12-21 ENCOUNTER — Ambulatory Visit
Admission: RE | Admit: 2018-12-21 | Discharge: 2018-12-21 | Disposition: A | Discharge status: Home / Self Care | Payer: BLUE CROSS/BLUE SHIELD | Source: Ambulatory Visit | Attending: Internal Medicine | Admitting: Internal Medicine

## 2018-12-21 DIAGNOSIS — D649 Anemia, unspecified: Secondary | ICD-10-CM | POA: Insufficient documentation

## 2018-12-21 DIAGNOSIS — Z1231 Encounter for screening mammogram for malignant neoplasm of breast: Secondary | ICD-10-CM | POA: Insufficient documentation

## 2018-12-21 HISTORY — DX: Anemia, unspecified: D64.9

## 2018-12-23 ENCOUNTER — Other Ambulatory Visit: Payer: Self-pay | Admitting: Internal Medicine

## 2018-12-23 DIAGNOSIS — R928 Other abnormal and inconclusive findings on diagnostic imaging of breast: Secondary | ICD-10-CM

## 2018-12-28 ENCOUNTER — Ambulatory Visit
Admission: RE | Admit: 2018-12-28 | Discharge: 2018-12-28 | Disposition: A | Discharge status: Home / Self Care | Payer: BLUE CROSS/BLUE SHIELD | Source: Ambulatory Visit | Attending: Internal Medicine | Admitting: Internal Medicine

## 2018-12-28 DIAGNOSIS — R928 Other abnormal and inconclusive findings on diagnostic imaging of breast: Secondary | ICD-10-CM

## 2018-12-29 ENCOUNTER — Telehealth: Payer: Self-pay

## 2018-12-29 NOTE — Telephone Encounter (Signed)
Per pt incoming call yesterday- she faxed our office form to be completed by Dr Gerarda Fraction for pt's return to work.  No other needs per her at that time. Joylene John NP reviewed and signed document.  Faxed to "[employer]" at this time for pt.

## 2019-01-03 ENCOUNTER — Telehealth: Payer: Self-pay | Admitting: *Deleted

## 2019-01-03 NOTE — Telephone Encounter (Signed)
Per patient request I have faxed the work note to [employer] at [phone]. Patient notified that the note was sent and received confirmation

## 2019-01-07 ENCOUNTER — Telehealth: Payer: Self-pay

## 2019-01-07 NOTE — Telephone Encounter (Signed)
Incoming call from patient. Faxed back to work form to pt's employer at [phone] per patient request. No other needs per her at this time.

## 2019-01-26 ENCOUNTER — Other Ambulatory Visit: Payer: Self-pay | Admitting: Internal Medicine

## 2019-01-26 ENCOUNTER — Encounter: Payer: Self-pay | Admitting: Internal Medicine

## 2019-01-26 DIAGNOSIS — J4520 Mild intermittent asthma, uncomplicated: Secondary | ICD-10-CM

## 2019-01-26 MED ORDER — ALBUTEROL SULFATE HFA 108 (90 BASE) MCG/ACT IN AERS: 1.0000 | INHALATION_SPRAY | Freq: Four times a day (QID) | RESPIRATORY_TRACT | 12 refills | Status: DC | PRN

## 2019-01-26 NOTE — Telephone Encounter (Signed)
Last OV 12/03/2018  Last refilled 08/22/2018   Sent to PCP to advise

## 2019-03-03 ENCOUNTER — Other Ambulatory Visit: Payer: Self-pay | Admitting: Obstetrics and Gynecology

## 2019-03-03 DIAGNOSIS — N6332 Unspecified lump in axillary tail of the left breast: Secondary | ICD-10-CM

## 2019-03-09 ENCOUNTER — Ambulatory Visit
Admission: RE | Admit: 2019-03-09 | Discharge: 2019-03-09 | Disposition: A | Discharge status: Home / Self Care | Payer: BLUE CROSS/BLUE SHIELD | Source: Ambulatory Visit | Attending: Obstetrics and Gynecology | Admitting: Obstetrics and Gynecology

## 2019-03-09 ENCOUNTER — Other Ambulatory Visit: Payer: Self-pay

## 2019-03-09 ENCOUNTER — Other Ambulatory Visit: Payer: Self-pay | Admitting: Obstetrics and Gynecology

## 2019-03-09 DIAGNOSIS — N6332 Unspecified lump in axillary tail of the left breast: Secondary | ICD-10-CM

## 2019-06-03 ENCOUNTER — Other Ambulatory Visit: Payer: Self-pay

## 2019-06-03 ENCOUNTER — Ambulatory Visit (INDEPENDENT_AMBULATORY_CARE_PROVIDER_SITE_OTHER): Payer: BC Managed Care – PPO | Admitting: Internal Medicine

## 2019-06-03 ENCOUNTER — Encounter: Payer: Self-pay | Admitting: Internal Medicine

## 2019-06-03 DIAGNOSIS — K581 Irritable bowel syndrome with constipation: Secondary | ICD-10-CM

## 2019-06-03 DIAGNOSIS — Z0000 Encounter for general adult medical examination without abnormal findings: Secondary | ICD-10-CM

## 2019-06-03 DIAGNOSIS — G44209 Tension-type headache, unspecified, not intractable: Secondary | ICD-10-CM

## 2019-06-03 DIAGNOSIS — W57XXXA Bitten or stung by nonvenomous insect and other nonvenomous arthropods, initial encounter: Secondary | ICD-10-CM

## 2019-06-03 DIAGNOSIS — Z13220 Encounter for screening for lipoid disorders: Secondary | ICD-10-CM

## 2019-06-03 DIAGNOSIS — D649 Anemia, unspecified: Secondary | ICD-10-CM

## 2019-06-03 HISTORY — DX: Encounter for general adult medical examination without abnormal findings: Z00.00

## 2019-06-03 MED ORDER — LINACLOTIDE 290 MCG PO CAPS: 290.0000 ug | ORAL_CAPSULE | Freq: Every day | ORAL | 3 refills | Status: DC

## 2019-06-03 NOTE — Progress Notes (Signed)
Virtual Visit via Video Note  I connected with Cynthia Burke  on 06/03/19 at  8:30 AM EDT by a video enabled telemedicine application and verified that I am speaking with the correct person using two identifiers.  Location patient: home Location provider:work  Persons participating in the virtual visit: patient, provider  I discussed the limitations of evaluation and management by telemedicine and the availability of in person appointments. The patient expressed understanding and agreed to proceed.   HPI: 1. Annual 2. C/o chronic constipation linzess 290 mcg helped before tried stool softners 6 pills bid, fiber, senna both which gave her GERD increased and she does not miralax  3. H/a daily taking prn Tylenol and Goody powders, she has reduced caffeine but reports increased stress with work. She also changed adhd med vyvanse to mydayis 50 mg x 1 month she is not sure if she is having side effects from this but has been more irritable though she has more energy 4. Hypothyroidism f/u with Dr. Gabriel Carina last seen 05/10/2019 due to f/u in 6 months on armour thyroid 90 mcg qd TSH 05/04/19 2.903 normal    ROS: See pertinent positives and negatives per HPI. General: wt stable  HEENT: no sore throat  CV: no chest pain  Lungs: no sob  GI: +chronic constipation  GU: no issues  Skin: no issues  MSK: intermittent diffuse joint pain  Neuro: h/a  Psych: +stress   Past Medical History:  Diagnosis Date  . ADD (attention deficit disorder)   . Allergy   . Anemia   . Asthma   . Depression   . Hashimoto's disease   . Hashimoto's thyroiditis   . History of chicken pox   . History of UTI   . Hyperlipidemia   . Premature ovarian insufficiency    [facility] 29 Dr. Hughie Closs   . Seizures ([place])    medication induced wellbutrin    Past Surgical History:  Procedure Laterality Date  . ABDOMINAL HYSTERECTOMY     Dr. Gerarda Fraction 11-04-18  . DILATION AND CURETTAGE OF UTERUS     2015 and 2018   .  HYSTERECTOMY ABDOMINAL WITH SALPINGECTOMY Bilateral 11/04/2018   Procedure: HYSTERECTOMY ABDOMINAL WITH BILATERAL SALPINGECTOMY, Exploratory laparotomy;  Surgeon: Isabel Caprice, MD;  Location: [facility];  Service: Gynecology;  Laterality: Bilateral;  . MYOMECTOMY     2015     Family History  Problem Relation Age of Onset  . Rheum arthritis Mother   . Hashimoto's thyroiditis Mother   . Hypertension Mother   . Hyperlipidemia Mother   . Arthritis Mother   . Asthma Mother   . Depression Mother   . Kidney disease Mother   . Miscarriages / [place] Mother   . Diabetes Mellitus II Father   . Hypothyroidism Father   . Hypertension Father   . Coronary artery disease Father   . Hyperlipidemia Father   . Arthritis Father   . COPD Father   . Depression Father   . Diabetes Father   . Heart disease Father   . Kidney disease Father   . Bladder Cancer Father   . Prostate cancer Father   . Stroke Maternal Grandmother   . Heart disease Maternal Grandmother   . Breast cancer Paternal Grandmother 71  . Early death Paternal Grandmother   . Cancer Paternal Grandmother        breast died   . Miscarriages / Stillbirths Paternal Grandmother   . Learning disabilities Brother   . Hypertension Brother   .  Depression Brother   . Alcohol abuse Maternal Grandfather   . Early death Maternal Grandfather   . Heart disease Paternal Grandfather     SOCIAL HX: married w/o kids  PT   Current Outpatient Medications:  .  albuterol (PROVENTIL HFA;VENTOLIN HFA) 108 (90 Base) MCG/ACT inhaler, Inhale 1-2 puffs into the lungs every 6 (six) hours as needed for wheezing or shortness of breath., Disp: 1 Inhaler, Rfl: 12 .  azelastine (ASTELIN) 0.1 % nasal spray, Place 2 sprays into both nostrils 2 (two) times daily as needed for allergies. , Disp: , Rfl: 8 .  Cholecalciferol (VITAMIN D3) 25 MCG (1000 UT) CAPS, Take 2,000 Units by mouth daily., Disp: , Rfl:  .  Coenzyme Q10 (COQ10) 100 MG CAPS, Take 900 mg by  mouth 2 (two) times daily. , Disp: , Rfl:  .  FLUoxetine (PROZAC) 40 MG capsule, Take 40 mg by mouth daily. , Disp: , Rfl:  .  montelukast (SINGULAIR) 10 MG tablet, Take 1 tablet (10 mg total) by mouth at bedtime., Disp: 90 tablet, Rfl: 3 .  Multiple Vitamins-Minerals (MULTIVITAMIN ADULT) CHEW, Chew 2 each by mouth daily., Disp: , Rfl:  .  thyroid (ARMOUR) 90 MG tablet, Take 90 mg by mouth daily. Patient takes 90 mg 6 days 45 mg on Sunday., Disp: , Rfl:  .  UNABLE TO FIND, Med Name: estrogen patch 0.5 2x per week, Disp: , Rfl:  .  Calcium Carbonate-Vit D-Min (CALTRATE 600+D PLUS PO), Take 1 capsule by mouth daily., Disp: , Rfl:  .  linaclotide (LINZESS) 290 MCG CAPS capsule, Take 1 capsule (290 mcg total) by mouth daily before breakfast., Disp: 90 capsule, Rfl: 3 .  Magnesium 250 MG TABS, Take 1 tablet by mouth daily., Disp: , Rfl:  .  ondansetron (ZOFRAN) 8 MG tablet, Take 8 mg by mouth as needed for nausea or vomiting., Disp: , Rfl:   EXAM:  VITALS per patient if applicable:  GENERAL: alert, oriented, appears well and in no acute distress  HEENT: atraumatic, conjunttiva clear, no obvious abnormalities on inspection of external nose and ears  NECK: normal movements of the head and neck  LUNGS: on inspection no signs of respiratory distress, breathing rate appears normal, no obvious gross SOB, gasping or wheezing  CV: no obvious cyanosis  MS: moves all visible extremities without noticeable abnormality  PSYCH/NEURO: pleasant and cooperative, no obvious depression or anxiety, speech and thought processing grossly intact  ASSESSMENT AND PLAN:  Discussed the following assessment and plan Annual physical exam - sch fasting labs  utd flu and Tdap  mmr immune  Hep B immune    mammogram2/18/20 neg  -prior mammogram 10/03/13 normal  Never smoker Pap 04/16/17 neg pap neg HPV s/p total hysterectomy ovaries intact [facility] [place] DEXA normal 2020 [facility]  requested pt agreeable  rec healthy diet and exercise reduce stress  Irritable bowel syndrome with constipation - Plan: linaclotide (LINZESS) 290 MCG CAPS capsule, QD  Anemia, unspecified type - Plan: CBC with Differential/Platelet, IBC + Ferritin  Likely tension h/a rec reduce stress prn Tylenol or NSAIDS or excedrin migraine otc   Right thyroid FNA 95/09/32 benign follicular cells, rare oncocytic metaplastic cells and histiocytes consistent with MNG (benign thyriod nodules) neg malignancy. Thyroid US 09/12/13 thyroiditis, solitary nodule right thyroid increased in size   I discussed the assessment and treatment plan with the patient. The patient was provided an opportunity to ask questions and all were answered. The patient agreed  with the plan and demonstrated an understanding of the instructions.   The patient was advised to call back or seek an in-person evaluation if the symptoms worsen or if the condition fails to improve as anticipated.  Time spent 20 minutes  Delorise Jackson, MD

## 2019-06-03 NOTE — Patient Instructions (Signed)
Tension Headache, Adult A tension headache is a feeling of pain, pressure, or aching in the head that is often felt over the front and sides of the head. The pain can be dull, or it can feel tight (constricting). There are two types of tension headache:  Episodic tension headache. This is when the headaches happen fewer than 15 days a month.  Chronic tension headache. This is when the headaches happen more than 15 days a month during a 11-month period. A tension headache can last from 30 minutes to several days. It is the most common kind of headache. Tension headaches are not normally associated with nausea or vomiting, and they do not get worse with physical activity. What are the causes? The exact cause of this condition is not known. Tension headaches are often triggered by stress, anxiety, or depression. Other triggers include:  Alcohol.  Too much caffeine or caffeine withdrawal.  Respiratory infections, such as colds, flu, or sinus infections.  Dental problems or teeth clenching.  Tiredness (fatigue).  Holding your head and neck in the same position for a long period of time, such as while using a computer.  Smoking.  Arthritis of the neck. What are the signs or symptoms? Symptoms of this condition include:  A feeling of pressure or tightness around the head.  Dull, aching head pain.  Pain over the front and sides of the head.  Tenderness in the muscles of the head, neck, and shoulders. How is this diagnosed? This condition may be diagnosed based on your symptoms, your medical history, and a physical exam. If your symptoms are severe or unusual, you may have imaging tests, such as a CT scan or an MRI of your head. Your vision may also be checked. How is this treated? This condition may be treated with lifestyle changes and with medicines that help relieve symptoms. Follow these instructions at home: Managing pain  Take over-the-counter and prescription medicines only  as told by your health care provider.  When you have a headache, lie down in a dark, quiet room.  If directed, apply ice to the head and neck: ? Put ice in a plastic bag. ? Place a towel between your skin and the bag. ? Leave the ice on for 20 minutes, 2-3 times a day.  If directed, apply heat to the back of your neck as often as told by your health care provider. Use the heat source that your health care provider recommends, such as a moist heat pack or a heating pad. ? Place a towel between your skin and the heat source. ? Leave the heat on for 20-30 minutes. ? Remove the heat if your skin turns bright red. This is especially important if you are unable to feel pain, heat, or cold. You may have a greater risk of getting burned. Eating and drinking  Eat meals on a regular schedule.  Limit alcohol intake to no more than 1 drink a day for nonpregnant women and 2 drinks a day for men. One drink equals 12 oz of beer, 5 oz of wine, or 1 oz of hard liquor.  Drink enough fluid to keep your urine pale yellow.  Decrease your caffeine intake, or stop using caffeine. Lifestyle  Get 7-9 hours of sleep each night, or get the amount of sleep recommended by your health care provider.  At bedtime, remove all electronic devices from your room. Electronic devices include computers, phones, and tablets.  Find ways to manage your stress. Some things  that can help relieve stress include: ? Exercise. ? Deep breathing exercises. ? Yoga. ? Listening to music. ? Positive mental imagery.  Try to sit up straight and avoid tensing your muscles.  Do not use any products that contain nicotine or tobacco, such as cigarettes and e-cigarettes. If you need help quitting, ask your health care provider. General instructions   Keep all follow-up visits as told by your health care provider. This is important.  Avoid any headache triggers. Keep a headache journal to help find out what may trigger your  headaches. For example, write down: ? What you eat and drink. ? How much sleep you get. ? Any change to your diet or medicines. Contact a health care provider if:  Your headache does not get better.  Your headache comes back.  You are sensitive to sounds, light, or smells because of a headache.  You have nausea or you vomit.  Your stomach hurts. Get help right away if:  You suddenly develop a very severe headache along with any of the following: ? A stiff neck. ? Nausea and vomiting. ? Confusion. ? Weakness. ? Double vision or loss of vision. ? Shortness of breath. ? Rash. ? Unusual sleepiness. ? Fever. ? Trouble speaking. ? Pain in your eyes or ears. ? Trouble walking or balancing. ? Feeling faint or passing out. Summary  A tension headache is a feeling of pain, pressure, or aching in the head that is often felt over the front and sides of the head.  A tension headache can last from 30 minutes to several days. It is the most common kind of headache.  This condition may be diagnosed based on your symptoms, your medical history, and a physical exam.  This condition may be treated with lifestyle changes and with medicines that help relieve symptoms. This information is not intended to replace advice given to you by your health care provider. Make sure you discuss any questions you have with your health care provider. Document Released: 10/27/2005 Document Revised: 10/09/2017 Document Reviewed: 02/06/2017 Elsevier Patient Education  2020 [employer].

## 2019-06-25 ENCOUNTER — Encounter: Payer: Self-pay | Admitting: Internal Medicine

## 2019-07-01 NOTE — Addendum Note (Signed)
Addended by: Orland Mustard on: 07/01/2019 12:19 PM   Modules accepted: Orders

## 2019-07-04 ENCOUNTER — Other Ambulatory Visit: Payer: Self-pay

## 2019-07-04 DIAGNOSIS — Z20822 Contact with and (suspected) exposure to covid-19: Secondary | ICD-10-CM

## 2019-07-05 LAB — NOVEL CORONAVIRUS, NAA: SARS-CoV-2, NAA: NOT DETECTED

## 2019-07-27 ENCOUNTER — Encounter: Payer: Self-pay | Admitting: Internal Medicine

## 2019-08-01 ENCOUNTER — Other Ambulatory Visit: Payer: Self-pay | Admitting: Internal Medicine

## 2019-08-01 DIAGNOSIS — M2550 Pain in unspecified joint: Secondary | ICD-10-CM

## 2019-08-01 DIAGNOSIS — W57XXXA Bitten or stung by nonvenomous insect and other nonvenomous arthropods, initial encounter: Secondary | ICD-10-CM

## 2019-08-02 ENCOUNTER — Ambulatory Visit (INDEPENDENT_AMBULATORY_CARE_PROVIDER_SITE_OTHER): Payer: BC Managed Care – PPO | Admitting: Internal Medicine

## 2019-08-02 ENCOUNTER — Other Ambulatory Visit: Payer: Self-pay

## 2019-08-02 DIAGNOSIS — M2550 Pain in unspecified joint: Secondary | ICD-10-CM | POA: Diagnosis not present

## 2019-08-02 DIAGNOSIS — W57XXXD Bitten or stung by nonvenomous insect and other nonvenomous arthropods, subsequent encounter: Secondary | ICD-10-CM | POA: Diagnosis not present

## 2019-08-02 DIAGNOSIS — R5383 Other fatigue: Secondary | ICD-10-CM | POA: Diagnosis not present

## 2019-08-02 DIAGNOSIS — F909 Attention-deficit hyperactivity disorder, unspecified type: Secondary | ICD-10-CM

## 2019-08-02 DIAGNOSIS — E039 Hypothyroidism, unspecified: Secondary | ICD-10-CM

## 2019-08-02 DIAGNOSIS — M791 Myalgia, unspecified site: Secondary | ICD-10-CM | POA: Diagnosis not present

## 2019-08-02 NOTE — Progress Notes (Signed)
Telephone Note  I connected with Cynthia Burke   on 08/02/19 at  8:00 AM EDT by telephone and verified that I am speaking with the correct person using two identifiers.  Location patient: home Location provider:work or home office Persons participating in the virtual visit: patient, provider  I discussed the limitations of evaluation and management by telemedicine and the availability of in person appointments. The patient expressed understanding and agreed to proceed.   HPI: 1. C/o fatigue, joint pain and muscle aches x months with h/o 2 tick bites in the past 1 was in between her toes and she thought looked like a deer tick. She was also scratched on the leg by a cat and swollen axillary LN and told she had cat scratch fever but never was givnen abx 8 months ago [place] 03/2019 left axillary negative   2. Hypothyroidism TSH normal 2.903 [facility] on medication      ROS: See pertinent positives and negatives per HPI.  Past Medical History:  Diagnosis Date  . ADD (attention deficit disorder)   . Allergy   . Anemia   . Asthma   . Depression   . Hashimoto's disease   . Hashimoto's thyroiditis   . History of chicken pox   . History of UTI   . Hyperlipidemia   . Premature ovarian insufficiency    FSH 29 Dr. Hughie Closs   . Seizures ([place])    medication induced wellbutrin    Past Surgical History:  Procedure Laterality Date  . ABDOMINAL HYSTERECTOMY     Dr. Gerarda Fraction 11-04-18  . DILATION AND CURETTAGE OF UTERUS     2015 and 2018   . HYSTERECTOMY ABDOMINAL WITH SALPINGECTOMY Bilateral 11/04/2018   Procedure: HYSTERECTOMY ABDOMINAL WITH BILATERAL SALPINGECTOMY, Exploratory laparotomy;  Surgeon: Isabel Caprice, MD;  Location: [facility];  Service: Gynecology;  Laterality: Bilateral;  . MYOMECTOMY     2015     Family History  Problem Relation Age of Onset  . Rheum arthritis Mother   . Hashimoto's thyroiditis Mother   . Hypertension Mother   . Hyperlipidemia Mother   . Arthritis  Mother   . Asthma Mother   . Depression Mother   . Kidney disease Mother   . Miscarriages / [place] Mother   . Diabetes Mellitus II Father   . Hypothyroidism Father   . Hypertension Father   . Coronary artery disease Father   . Hyperlipidemia Father   . Arthritis Father   . COPD Father   . Depression Father   . Diabetes Father   . Heart disease Father   . Kidney disease Father   . Bladder Cancer Father   . Prostate cancer Father   . Stroke Maternal Grandmother   . Heart disease Maternal Grandmother   . Breast cancer Paternal Grandmother 55  . Early death Paternal Grandmother   . Cancer Paternal Grandmother        breast died   . Miscarriages / Stillbirths Paternal Grandmother   . Learning disabilities Brother   . Hypertension Brother   . Depression Brother   . Alcohol abuse Maternal Grandfather   . Early death Maternal Grandfather   . Heart disease Paternal Grandfather     SOCIAL HX: married    Current Outpatient Medications:  .  Amphet-Dextroamphet 3-Bead ER (MYDAYIS) 50 MG CP24, Take 50 mg by mouth daily. Maydais, Disp: , Rfl:  .  albuterol (PROVENTIL HFA;VENTOLIN HFA) 108 (90 Base) MCG/ACT inhaler, Inhale 1-2 puffs into the lungs every  6 (six) hours as needed for wheezing or shortness of breath., Disp: 1 Inhaler, Rfl: 12 .  azelastine (ASTELIN) 0.1 % nasal spray, Place 2 sprays into both nostrils 2 (two) times daily as needed for allergies. , Disp: , Rfl: 8 .  Calcium Carbonate-Vit D-Min (CALTRATE 600+D PLUS PO), Take 1 capsule by mouth daily., Disp: , Rfl:  .  Cholecalciferol (VITAMIN D3) 25 MCG (1000 UT) CAPS, Take 2,000 Units by mouth daily., Disp: , Rfl:  .  Coenzyme Q10 (COQ10) 100 MG CAPS, Take 900 mg by mouth 2 (two) times daily. , Disp: , Rfl:  .  FLUoxetine (PROZAC) 40 MG capsule, Take 40 mg by mouth daily. , Disp: , Rfl:  .  linaclotide (LINZESS) 290 MCG CAPS capsule, Take 1 capsule (290 mcg total) by mouth daily before breakfast., Disp: 90 capsule, Rfl:  3 .  Magnesium 250 MG TABS, Take 1 tablet by mouth daily., Disp: , Rfl:  .  montelukast (SINGULAIR) 10 MG tablet, Take 1 tablet (10 mg total) by mouth at bedtime., Disp: 90 tablet, Rfl: 3 .  Multiple Vitamins-Minerals (MULTIVITAMIN ADULT) CHEW, Chew 2 each by mouth daily., Disp: , Rfl:  .  ondansetron (ZOFRAN) 8 MG tablet, Take 8 mg by mouth as needed for nausea or vomiting., Disp: , Rfl:  .  thyroid (ARMOUR) 90 MG tablet, Take 90 mg by mouth daily. Patient takes 90 mg 6 days 45 mg on Sunday., Disp: , Rfl:  .  UNABLE TO FIND, Med Name: estrogen patch 0.5 2x per week, Disp: , Rfl:   EXAM:  VITALS per patient if applicable:  GENERAL: alert, oriented, appears well and in no acute distress  PSYCH/NEURO: pleasant and cooperative, no obvious depression or anxiety, speech and thought processing grossly intact  ASSESSMENT AND PLAN:  Discussed the following assessment and plan:  Fatigue ? Etiology anxiety, reviewed meds less likely stimulant, linzess, ? Post surgical hysterectomy r/o autoimmune vs tick born Illness - Plan: Comprehensive metabolic panel, CBC w/Diff, Iron, TIBC and Ferritin Panel, Lipid panel, B. burgdorfi antibodies, Rocky mtn spotted fvr abs pnl(IgG+IgM), TSH, Antinuclear Antib (ANA), Sedimentation rate, C-reactive protein, CYCLIC CITRUL PEPTIDE ANTIBODY, IGG/IGA, Uric acid, CK (Creatine Kinase), Ehrlichia antibody panel  -if w/u negative consider Dr. Tomasa Blase rheumatology in [place]: Comprehensive metabolic panel, CBC w/Diff, Iron, TIBC and Ferritin Panel, Lipid panel, B. burgdorfi antibodies, Rocky mtn spotted fvr abs pnl(IgG+IgM), TSH, Antinuclear Antib (ANA), Sedimentation rate, C-reactive protein, CYCLIC CITRUL PEPTIDE ANTIBODY, IGG/IGA, Uric acid, CK (Creatine Kinase), Ehrlichia antibody panel  Muscle ache - Plan: Comprehensive metabolic panel, CBC w/Diff, Iron, TIBC and Ferritin Panel, Lipid panel, B. burgdorfi antibodies, Rocky mtn spotted fvr abs  pnl(IgG+IgM), TSH, Antinuclear Antib (ANA), Sedimentation rate, C-reactive protein, CYCLIC CITRUL PEPTIDE ANTIBODY, IGG/IGA, Uric acid, CK (Creatine Kinase), Ehrlichia antibody panel  Tick bite, subsequent encounter - Plan: B. burgdorfi antibodies, Rocky mtn spotted fvr abs pnl(IgG+IgM), Ehrlichia antibody panel  Hypothyroidism, unspecified type - Plan: TSH TSH normal 05/04/19 2.903  F/u [facility]    Attention deficit hyperactivity disorder (ADHD), unspecified ADHD type - Plan: Amphet-Dextroamphet 3-Bead ER (MYDAYIS) 50 MG CP24  HM utd flu and Tdap  Referred 12/28/2018 repeat  Negative  -prior mammogram 10/03/13 normal Never smoker Pap 04/16/17 neg pap neg HPV PFW s/p hysterectomy 11/04/18 with uterus cervix and b/l fallopian tubes removed   Right thyroid FNA [id number] benign follicular cells, rare oncocytic metaplastic cells and histiocytes consistent with MNG (benign thyriod nodules) neg malignancy. Thyroid US 09/12/13  thyroiditis, solitary nodule right thyroid increased in size -f/u [facility] Dr. Gabriel Carina   -we discussed possible serious and likely etiologies, options for evaluation and workup, limitations of telemedicine visit vs in person visit, treatment, treatment risks and precautions. Pt prefers to treat via telemedicine empirically rather then risking or undertaking an in person visit at this moment. Patient agrees to seek prompt in person care if worsening, new symptoms arise, or if is not improving with treatment.   I discussed the assessment and treatment plan with the patient. The patient was provided an opportunity to ask questions and all were answered. The patient agreed with the plan and demonstrated an understanding of the instructions.   The patient was advised to call back or seek an in-person evaluation if the symptoms worsen or if the condition fails to improve as anticipated.  Time spent 20 minutes   Delorise Jackson, MD

## 2019-08-14 ENCOUNTER — Emergency Department
Admission: EM | Admit: 2019-08-14 | Discharge: 2019-08-14 | Disposition: A | Discharge status: Home / Self Care | Payer: BC Managed Care – PPO | Attending: Emergency Medicine | Admitting: Emergency Medicine

## 2019-08-14 ENCOUNTER — Other Ambulatory Visit: Payer: Self-pay

## 2019-08-14 ENCOUNTER — Emergency Department: Payer: BC Managed Care – PPO

## 2019-08-14 DIAGNOSIS — Y929 Unspecified place or not applicable: Secondary | ICD-10-CM | POA: Diagnosis not present

## 2019-08-14 DIAGNOSIS — J45909 Unspecified asthma, uncomplicated: Secondary | ICD-10-CM | POA: Insufficient documentation

## 2019-08-14 DIAGNOSIS — S61259A Open bite of unspecified finger without damage to nail, initial encounter: Secondary | ICD-10-CM

## 2019-08-14 DIAGNOSIS — Y999 Unspecified external cause status: Secondary | ICD-10-CM | POA: Insufficient documentation

## 2019-08-14 DIAGNOSIS — R569 Unspecified convulsions: Secondary | ICD-10-CM | POA: Diagnosis not present

## 2019-08-14 DIAGNOSIS — Z79899 Other long term (current) drug therapy: Secondary | ICD-10-CM | POA: Insufficient documentation

## 2019-08-14 DIAGNOSIS — Y939 Activity, unspecified: Secondary | ICD-10-CM | POA: Diagnosis not present

## 2019-08-14 DIAGNOSIS — E069 Thyroiditis, unspecified: Secondary | ICD-10-CM | POA: Insufficient documentation

## 2019-08-14 DIAGNOSIS — W5501XA Bitten by cat, initial encounter: Secondary | ICD-10-CM | POA: Insufficient documentation

## 2019-08-14 DIAGNOSIS — S61002A Unspecified open wound of left thumb without damage to nail, initial encounter: Secondary | ICD-10-CM | POA: Insufficient documentation

## 2019-08-14 MED ORDER — DEXAMETHASONE SODIUM PHOSPHATE 10 MG/ML IJ SOLN
10.0000 mg | Freq: Once | INTRAMUSCULAR | Status: AC
  Administered 2019-08-14: 10 mg via INTRAMUSCULAR
  Filled 2019-08-14: qty 1

## 2019-08-14 MED ORDER — TRAMADOL HCL 50 MG PO TABS: 50.0000 mg | ORAL_TABLET | Freq: Three times a day (TID) | ORAL | 0 refills | Status: DC | PRN

## 2019-08-14 MED ORDER — IBUPROFEN 800 MG PO TABS: 800.0000 mg | ORAL_TABLET | Freq: Three times a day (TID) | ORAL | 0 refills | Status: DC | PRN

## 2019-08-14 MED ORDER — HYDROCODONE-ACETAMINOPHEN 10-325 MG PO TABS: 1.0000 | ORAL_TABLET | Freq: Four times a day (QID) | ORAL | 0 refills | Status: DC | PRN

## 2019-08-14 MED ORDER — AMOXICILLIN-POT CLAVULANATE 875-125 MG PO TABS
1.0000 | ORAL_TABLET | Freq: Once | ORAL | Status: AC
  Administered 2019-08-14: 1 via ORAL
  Filled 2019-08-14: qty 1

## 2019-08-14 MED ORDER — HYDROCODONE-ACETAMINOPHEN 5-325 MG PO TABS
1.0000 | ORAL_TABLET | Freq: Once | ORAL | Status: AC
  Administered 2019-08-14: 1 via ORAL
  Filled 2019-08-14: qty 1

## 2019-08-14 MED ORDER — AMOXICILLIN-POT CLAVULANATE 875-125 MG PO TABS: 1.0000 | ORAL_TABLET | Freq: Two times a day (BID) | ORAL | 0 refills | Status: DC

## 2019-08-14 NOTE — ED Provider Notes (Addendum)
[facility] Emergency Department Provider Note ____________________________________________  Time seen: 2100  I have reviewed the triage vital signs and the nursing notes.  HISTORY  Chief Complaint  Animal Bite   HPI Cynthia Burke is a 43 y.o. female presents to the ER today with complaint of left thumb pain and swelling status post cat bite.  She reports she was bit by her cat who is up-to-date on his vaccines.  She describes the pain as throbbing.  She soaked the left thumb in hydrogen peroxide.  She has taken 800 mg of ibuprofen OTC.  She reports her last tetanus was in 2019.  Past Medical History:  Diagnosis Date  . ADD (attention deficit disorder)   . Allergy   . Anemia   . Asthma   . Depression   . Hashimoto's disease   . Hashimoto's thyroiditis   . History of chicken pox   . History of UTI   . Hyperlipidemia   . Premature ovarian insufficiency    [facility] 29 Dr. Hughie Closs   . Seizures ([place])    medication induced wellbutrin    Patient Active Problem List   Diagnosis Date Noted  . Irritable bowel syndrome with constipation 06/03/2019  . Annual physical exam 06/03/2019  . Chronic constipation 12/03/2018  . Hypothyroidism 09/02/2018  . ADHD 09/02/2018  . Anxiety and depression 09/02/2018  . Asthma 09/02/2018  . HLD (hyperlipidemia) 09/02/2018  . Hashimoto's thyroiditis 09/02/2018  . Allergies 09/02/2018  . Thumb pain, left 09/02/2018  . Premature ovarian failure 09/02/2018    Past Surgical History:  Procedure Laterality Date  . ABDOMINAL HYSTERECTOMY     Dr. Gerarda Fraction 11-04-18  . DILATION AND CURETTAGE OF UTERUS     2015 and 2018   . HYSTERECTOMY ABDOMINAL WITH SALPINGECTOMY Bilateral 11/04/2018   Procedure: HYSTERECTOMY ABDOMINAL WITH BILATERAL SALPINGECTOMY, Exploratory laparotomy;  Surgeon: Isabel Caprice, MD;  Location: [facility];  Service: Gynecology;  Laterality: Bilateral;  . MYOMECTOMY     2015     Prior to Admission  medications   Medication Sig Start Date End Date Taking? Authorizing Provider  albuterol (PROVENTIL HFA;VENTOLIN HFA) 108 (90 Base) MCG/ACT inhaler Inhale 1-2 puffs into the lungs every 6 (six) hours as needed for wheezing or shortness of breath. 01/26/19   McLean-Scocuzza, Nino Glow, MD  amoxicillin-clavulanate (AUGMENTIN) 875-125 MG tablet Take 1 tablet by mouth 2 (two) times daily. 08/14/19   Jearld Fenton, NP  Amphet-Dextroamphet 3-Bead ER (MYDAYIS) 50 MG CP24 Take 50 mg by mouth daily. [place]    [provider]  azelastine (ASTELIN) 0.1 % nasal spray Place 2 sprays into both nostrils 2 (two) times daily as needed for allergies.  07/27/18   [provider]  Calcium Carbonate-Vit D-Min (CALTRATE 600+D PLUS PO) Take 1 capsule by mouth daily.    [provider]  Cholecalciferol (VITAMIN D3) 25 MCG (1000 UT) CAPS Take 2,000 Units by mouth daily.    [provider]  Coenzyme Q10 (COQ10) 100 MG CAPS Take 900 mg by mouth 2 (two) times daily.     [provider]  FLUoxetine (PROZAC) 40 MG capsule Take 40 mg by mouth daily.  10/13/17   [provider]  linaclotide Rolan Lipa) 290 MCG CAPS capsule Take 1 capsule (290 mcg total) by mouth daily before breakfast. 06/03/19   McLean-Scocuzza, Nino Glow, MD  Magnesium 250 MG TABS Take 1 tablet by mouth daily.    [provider]  montelukast (SINGULAIR) 10 MG tablet Take  1 tablet (10 mg total) by mouth at bedtime. 09/02/18   McLean-Scocuzza, Nino Glow, MD  Multiple Vitamins-Minerals (MULTIVITAMIN ADULT) CHEW Chew 2 each by mouth daily.    [provider]  ondansetron (ZOFRAN) 8 MG tablet Take 8 mg by mouth as needed for nausea or vomiting.    [provider]  thyroid (ARMOUR) 90 MG tablet Take 90 mg by mouth daily. Patient takes 90 mg 6 days 45 mg on Sunday.    [provider]  traMADol (ULTRAM) 50 MG tablet Take 1 tablet (50 mg total) by mouth every 8 (eight) hours as needed. 08/14/19  08/13/20  Jearld Fenton, NP  UNABLE TO FIND Med Name: estrogen patch 0.5 2x per week    [provider]    Allergies Wellbutrin [bupropion], Latex, and Other  Family History  Problem Relation Age of Onset  . Rheum arthritis Mother   . Hashimoto's thyroiditis Mother   . Hypertension Mother   . Hyperlipidemia Mother   . Arthritis Mother   . Asthma Mother   . Depression Mother   . Kidney disease Mother   . Miscarriages / [place] Mother   . Diabetes Mellitus II Father   . Hypothyroidism Father   . Hypertension Father   . Coronary artery disease Father   . Hyperlipidemia Father   . Arthritis Father   . COPD Father   . Depression Father   . Diabetes Father   . Heart disease Father   . Kidney disease Father   . Bladder Cancer Father   . Prostate cancer Father   . Stroke Maternal Grandmother   . Heart disease Maternal Grandmother   . Breast cancer Paternal Grandmother 21  . Early death Paternal Grandmother   . Cancer Paternal Grandmother        breast died   . Miscarriages / Stillbirths Paternal Grandmother   . Learning disabilities Brother   . Hypertension Brother   . Depression Brother   . Alcohol abuse Maternal Grandfather   . Early death Maternal Grandfather   . Heart disease Paternal Grandfather     Social History Social History   Tobacco Use  . Smoking status: Never Smoker  . Smokeless tobacco: Never Used  Substance Use Topics  . Alcohol use: No  . Drug use: No    Review of Systems  Constitutional: Negative for fever, chills, or body aches. Cardiovascular: Negative for chest pain or chest tightness. Respiratory: Negative for shortness of breath. Musculoskeletal: Positive for left thumb pain and swelling. Skin: Positive for puncture wound to left thumb. Neurological: Negative for focal weakness, tingling or numbness. ____________________________________________  PHYSICAL EXAM:  VITAL SIGNS: ED Triage Vitals  Enc Vitals Group     BP  08/14/19 1930 (!) 155/94     Pulse Rate 08/14/19 1930 (!) 102     Resp --      Temp 08/14/19 1930 98.4 F (36.9 C)     Temp Source 08/14/19 1930 Oral     SpO2 08/14/19 1930 100 %     Weight 08/14/19 1932 155 lb (70.3 kg)     Height 08/14/19 1932 5\' 7"  (1.702 m)     Head Circumference --      Peak Flow --      Pain Score 08/14/19 1931 6     Pain Loc --      Pain Edu? --      Excl. in [place]? --     Constitutional: Alert and oriented. Well appearing  and in no distress. Cardiovascular: Tachycardic, regular rhythm.  Left radial pulse 2+. Respiratory: Normal respiratory effort. No wheezes/rales/rhonchi. Musculoskeletal: Decreased flexion of the left thumb secondary to pain and swelling pain with palpation over the DIP of the left thumb.Marland Kitchen Neurologic: Sensation intact to left hand. Skin:  Skin is warm, dry and intact.  Puncture wound noted of pad of left thumb.  ____________________________________________   RADIOLOGY  Imaging Orders     DG Finger Thumb Left IMPRESSION:  No acute osseous abnormality   ____________________________________________  INITIAL IMPRESSION / ASSESSMENT AND PLAN / ED COURSE  Left Thumb Pain and Swelling secondary to Cat Bite:  Xray left thumb negative for bony abnormality Augmentin 875-125 mg PO x 1 in ER Norco 5-325 mg PO x 2  in ER Decadron 10 mg IM x 1 in ER RX for Augmentin BID x 10 days RX for Norco 10-325 mg TID prn RX for Ibuprofen 800 mg Q6H prn Encouraged elevation Follow up with PCP if pain, swelling persists or worsens    I reviewed the patient's prescription history over the last 12 months in the multi-state controlled substances database(s) that includes [place], [place], [place], [place], [place], [place], [place], [place], [place], [place], [place], [place], [place], and [place].  Results were notable for no recent controlled substances in the last 6  months. ____________________________________________  FINAL CLINICAL IMPRESSION(S) / ED DIAGNOSES  Final diagnoses:  Cat bite of finger, initial encounter   Webb Silversmith, NP    Jearld Fenton, NP 08/14/19 2153    Jearld Fenton, NP 08/14/19 [id number]    Carrie Mew, MD 08/18/19 1523

## 2019-08-14 NOTE — Discharge Instructions (Addendum)
You were seen today for cat bite of left thumb.  Your x-ray is negative for bony abnormality or extensive soft tissue swelling.  Your tetanus is up-to-date.  I am giving you a prescription for antibiotics twice daily for 10 days.  I am also given you a prescription for pain medication every 6 hours as needed for pain.  Please follow-up with your PCP if pain, swelling or redness worsens.

## 2019-08-14 NOTE — ED Notes (Signed)
Pt with cat bite to left thumb. Puncture wound to pad of thumb,small  laceration to inside of thumb by nail. Pt states thumb is throbbing. Left thumb is swollen and throbbing.

## 2019-08-14 NOTE — ED Triage Notes (Signed)
Pt to the er for a bite to the left thumb by a cat. Cat is up to date on shots. Pt reports pain and has cleaned the wound but states it is deep.

## 2019-09-12 ENCOUNTER — Other Ambulatory Visit: Payer: BC Managed Care – PPO

## 2019-11-07 ENCOUNTER — Ambulatory Visit: Payer: BC Managed Care – PPO | Attending: Internal Medicine

## 2019-11-07 DIAGNOSIS — Z20822 Contact with and (suspected) exposure to covid-19: Secondary | ICD-10-CM

## 2019-11-09 LAB — NOVEL CORONAVIRUS, NAA: SARS-CoV-2, NAA: NOT DETECTED

## 2019-11-10 ENCOUNTER — Ambulatory Visit: Payer: BC Managed Care – PPO | Attending: Internal Medicine

## 2019-11-10 DIAGNOSIS — Z20822 Contact with and (suspected) exposure to covid-19: Secondary | ICD-10-CM

## 2019-11-14 ENCOUNTER — Encounter: Payer: Self-pay | Admitting: Internal Medicine

## 2019-11-16 ENCOUNTER — Ambulatory Visit: Payer: BC Managed Care – PPO | Attending: Internal Medicine

## 2019-11-16 DIAGNOSIS — Z20822 Contact with and (suspected) exposure to covid-19: Secondary | ICD-10-CM

## 2019-11-16 LAB — NOVEL CORONAVIRUS, NAA

## 2019-11-18 LAB — NOVEL CORONAVIRUS, NAA: SARS-CoV-2, NAA: NOT DETECTED

## 2019-12-08 ENCOUNTER — Other Ambulatory Visit: Payer: Self-pay

## 2019-12-08 ENCOUNTER — Ambulatory Visit (INDEPENDENT_AMBULATORY_CARE_PROVIDER_SITE_OTHER): Payer: BC Managed Care – PPO | Admitting: Internal Medicine

## 2019-12-08 VITALS — BP 152/110 | Ht 67.0 in | Wt 162.0 lb

## 2019-12-08 DIAGNOSIS — I10 Essential (primary) hypertension: Secondary | ICD-10-CM | POA: Insufficient documentation

## 2019-12-08 DIAGNOSIS — R21 Rash and other nonspecific skin eruption: Secondary | ICD-10-CM

## 2019-12-08 DIAGNOSIS — Z1231 Encounter for screening mammogram for malignant neoplasm of breast: Secondary | ICD-10-CM

## 2019-12-08 DIAGNOSIS — F439 Reaction to severe stress, unspecified: Secondary | ICD-10-CM | POA: Diagnosis not present

## 2019-12-08 DIAGNOSIS — J309 Allergic rhinitis, unspecified: Secondary | ICD-10-CM | POA: Diagnosis not present

## 2019-12-08 DIAGNOSIS — M2550 Pain in unspecified joint: Secondary | ICD-10-CM

## 2019-12-08 MED ORDER — MONTELUKAST SODIUM 10 MG PO TABS: 10.0000 mg | ORAL_TABLET | Freq: Every day | ORAL | 3 refills | Status: DC

## 2019-12-08 MED ORDER — AMLODIPINE BESYLATE 2.5 MG PO TABS: 2.5000 mg | ORAL_TABLET | Freq: Every day | ORAL | 3 refills | Status: DC

## 2019-12-08 NOTE — Patient Instructions (Addendum)
Amlodipine Oral Tablets What is this medicine? AMLODIPINE (am LOE di peen) is a calcium channel blocker. It relaxes your blood vessels and decreases the amount of work the heart has to do. It treats high blood pressure and/or prevents chest pain (also called angina). This medicine may be used for other purposes; ask your health care provider or pharmacist if you have questions. COMMON BRAND NAME(S): Norvasc What should I tell my health care provider before I take this medicine? They need to know if you have any of these conditions:  heart disease  liver disease  an unusual or allergic reaction to amlodipine, other drugs, foods, dyes, or preservatives  pregnant or trying to get pregnant  breast-feeding How should I use this medicine? Take this drug by mouth. Take it as directed on the prescription label at the same time every day. You can take it with or without food. If it upsets your stomach, take it with food. Keep taking it unless your health care provider tells you to stop. Talk to your health care provider about the use of this drug in children. While it may be prescribed for children as young as 6 for selected conditions, precautions do apply. Overdosage: If you think you have taken too much of this medicine contact a poison control center or emergency room at once. NOTE: This medicine is only for you. Do not share this medicine with others. What if I miss a dose? If you miss a dose, take it as soon as you can. If it is almost time for your next dose, take only that dose. Do not take double or extra doses. What may interact with this medicine? This medicine may interact with the following medications:  clarithromycin  cyclosporine  diltiazem  itraconazole  simvastatin  tacrolimus This list may not describe all possible interactions. Give your health care provider a list of all the medicines, herbs, non-prescription drugs, or dietary supplements you use. Also tell them if  you smoke, drink alcohol, or use illegal drugs. Some items may interact with your medicine. What should I watch for while using this medicine? Visit your health care provider for regular checks on your progress. Check your blood pressure as directed. Ask your health care provider what your blood pressure should be. Also, find out when you should contact him or her. Do not treat yourself for coughs, colds, or pain while you are using this drug without asking your health care provider for advice. Some drugs may increase your blood pressure. You may get drowsy or dizzy. Do not drive, use machinery, or do anything that needs mental alertness until you know how this drug affects you. Do not stand up or sit up quickly, especially if you are an older patient. This reduces the risk of dizzy or fainting spells. What side effects may I notice from receiving this medicine? Side effects that you should report to your doctor or health care provider as soon as possible:  allergic reactions (skin rash, itching or hives; swelling of the face, lips, or tongue)  heart attack (trouble breathing; pain or tightness in the chest, neck, back or arms; unusually weak or tired)  low blood pressure (dizziness; feeling faint or lightheaded, falls; unusually weak or tired) Side effects that usually do not require medical attention (report these to your doctor or health care provider if they continue or are bothersome):  facial flushing  nausea  palpitations  stomach pain  sudden weight gain  swelling of the ankles, feet, hands  This list may not describe all possible side effects. Call your doctor for medical advice about side effects. You may report side effects to FDA at 1-800-FDA-1088. Where should I keep my medicine? Keep out of the reach of children and pets. Store at room temperature between 59 and 86 degrees F (15 and 30 degrees C). Protect from light and moisture. Keep the container tightly closed. Throw away  any unused drug after the expiration date. NOTE: This sheet is a summary. It may not cover all possible information. If you have questions about this medicine, talk to your doctor, pharmacist, or health care provider.  2020 Elsevier/Gold Standard (2019-08-02 19:39:45)  Hypertension, Adult High blood pressure (hypertension) is when the force of blood pumping through the arteries is too strong. The arteries are the blood vessels that carry blood from the heart throughout the body. Hypertension forces the heart to work harder to pump blood and may cause arteries to become narrow or stiff. Untreated or uncontrolled hypertension can cause a heart attack, heart failure, a stroke, kidney disease, and other problems. A blood pressure reading consists of a higher number over a lower number. Ideally, your blood pressure should be below 120/80. The first ("top") number is called the systolic pressure. It is a measure of the pressure in your arteries as your heart beats. The second ("bottom") number is called the diastolic pressure. It is a measure of the pressure in your arteries as the heart relaxes. What are the causes? The exact cause of this condition is not known. There are some conditions that result in or are related to high blood pressure. What increases the risk? Some risk factors for high blood pressure are under your control. The following factors may make you more likely to develop this condition:  Smoking.  Having type 2 diabetes mellitus, high cholesterol, or both.  Not getting enough exercise or physical activity.  Being overweight.  Having too much fat, sugar, calories, or salt (sodium) in your diet.  Drinking too much alcohol. Some risk factors for high blood pressure may be difficult or impossible to change. Some of these factors include:  Having chronic kidney disease.  Having a family history of high blood pressure.  Age. Risk increases with age.  Race. You may be at higher  risk if you are African American.  Gender. Men are at higher risk than women before age 72. After age 71, women are at higher risk than men.  Having obstructive sleep apnea.  Stress. What are the signs or symptoms? High blood pressure may not cause symptoms. Very high blood pressure (hypertensive crisis) may cause:  Headache.  Anxiety.  Shortness of breath.  Nosebleed.  Nausea and vomiting.  Vision changes.  Severe chest pain.  Seizures. How is this diagnosed? This condition is diagnosed by measuring your blood pressure while you are seated, with your arm resting on a flat surface, your legs uncrossed, and your feet flat on the floor. The cuff of the blood pressure monitor will be placed directly against the skin of your upper arm at the level of your heart. It should be measured at least twice using the same arm. Certain conditions can cause a difference in blood pressure between your right and left arms. Certain factors can cause blood pressure readings to be lower or higher than normal for a short period of time:  When your blood pressure is higher when you are in a health care provider's office than when you are at home, this  is called white coat hypertension. Most people with this condition do not need medicines.  When your blood pressure is higher at home than when you are in a health care provider's office, this is called masked hypertension. Most people with this condition may need medicines to control blood pressure. If you have a high blood pressure reading during one visit or you have normal blood pressure with other risk factors, you may be asked to:  Return on a different day to have your blood pressure checked again.  Monitor your blood pressure at home for 1 week or longer. If you are diagnosed with hypertension, you may have other blood or imaging tests to help your health care provider understand your overall risk for other conditions. How is this treated? This  condition is treated by making healthy lifestyle changes, such as eating healthy foods, exercising more, and reducing your alcohol intake. Your health care provider may prescribe medicine if lifestyle changes are not enough to get your blood pressure under control, and if:  Your systolic blood pressure is above 130.  Your diastolic blood pressure is above 80. Your personal target blood pressure may vary depending on your medical conditions, your age, and other factors. Follow these instructions at home: Eating and drinking   Eat a diet that is high in fiber and potassium, and low in sodium, added sugar, and fat. An example eating plan is called the DASH (Dietary Approaches to Stop Hypertension) diet. To eat this way: ? Eat plenty of fresh fruits and vegetables. Try to fill one half of your plate at each meal with fruits and vegetables. ? Eat whole grains, such as whole-wheat pasta, brown rice, or whole-grain bread. Fill about one fourth of your plate with whole grains. ? Eat or drink low-fat dairy products, such as skim milk or low-fat yogurt. ? Avoid fatty cuts of meat, processed or cured meats, and poultry with skin. Fill about one fourth of your plate with lean proteins, such as fish, chicken without skin, beans, eggs, or tofu. ? Avoid pre-made and processed foods. These tend to be higher in sodium, added sugar, and fat.  Reduce your daily sodium intake. Most people with hypertension should eat less than 1,500 mg of sodium a day.  Do not drink alcohol if: ? Your health care provider tells you not to drink. ? You are pregnant, may be pregnant, or are planning to become pregnant.  If you drink alcohol: ? Limit how much you use to:  0-1 drink a day for women.  0-2 drinks a day for men. ? Be aware of how much alcohol is in your drink. In the [place] one drink equals one 12 oz bottle of beer (355 mL), one 5 oz glass of wine (148 mL), or one 1 oz glass of hard liquor (44  mL). Lifestyle   Work with your health care provider to maintain a healthy body weight or to lose weight. Ask what an ideal weight is for you.  Get at least 30 minutes of exercise most days of the week. Activities may include walking, swimming, or biking.  Include exercise to strengthen your muscles (resistance exercise), such as Pilates or lifting weights, as part of your weekly exercise routine. Try to do these types of exercises for 30 minutes at least 3 days a week.  Do not use any products that contain nicotine or tobacco, such as cigarettes, e-cigarettes, and chewing tobacco. If you need help quitting, ask your health care provider.  Monitor  your blood pressure at home as told by your health care provider.  Keep all follow-up visits as told by your health care provider. This is important. Medicines  Take over-the-counter and prescription medicines only as told by your health care provider. Follow directions carefully. Blood pressure medicines must be taken as prescribed.  Do not skip doses of blood pressure medicine. Doing this puts you at risk for problems and can make the medicine less effective.  Ask your health care provider about side effects or reactions to medicines that you should watch for. Contact a health care provider if you:  Think you are having a reaction to a medicine you are taking.  Have headaches that keep coming back (recurring).  Feel dizzy.  Have swelling in your ankles.  Have trouble with your vision. Get help right away if you:  Develop a severe headache or confusion.  Have unusual weakness or numbness.  Feel faint.  Have severe pain in your chest or abdomen.  Vomit repeatedly.  Have trouble breathing. Summary  Hypertension is when the force of blood pumping through your arteries is too strong. If this condition is not controlled, it may put you at risk for serious complications.  Your personal target blood pressure may vary depending on  your medical conditions, your age, and other factors. For most people, a normal blood pressure is less than 120/80.  Hypertension is treated with lifestyle changes, medicines, or a combination of both. Lifestyle changes include losing weight, eating a healthy, low-sodium diet, exercising more, and limiting alcohol. This information is not intended to replace advice given to you by your health care provider. Make sure you discuss any questions you have with your health care provider. Document Revised: 07/07/2018 Document Reviewed: 07/07/2018 Elsevier Patient Education  [place] DASH stands for "Dietary Approaches to Stop Hypertension." The DASH eating plan is a healthy eating plan that has been shown to reduce high blood pressure (hypertension). It may also reduce your risk for type 2 diabetes, heart disease, and stroke. The DASH eating plan may also help with weight loss. What are tips for following this plan?  General guidelines  Avoid eating more than 2,300 mg (milligrams) of salt (sodium) a day. If you have hypertension, you may need to reduce your sodium intake to 1,500 mg a day.  Limit alcohol intake to no more than 1 drink a day for nonpregnant women and 2 drinks a day for men. One drink equals 12 oz of beer, 5 oz of wine, or 1 oz of hard liquor.  Work with your health care provider to maintain a healthy body weight or to lose weight. Ask what an ideal weight is for you.  Get at least 30 minutes of exercise that causes your heart to beat faster (aerobic exercise) most days of the week. Activities may include walking, swimming, or biking.  Work with your health care provider or diet and nutrition specialist (dietitian) to adjust your eating plan to your individual calorie needs. Reading food labels   Check food labels for the amount of sodium per serving. Choose foods with less than 5 percent of the Daily Value of sodium. Generally, foods with less than 300  mg of sodium per serving fit into this eating plan.  To find whole grains, look for the word "whole" as the first word in the ingredient list. Shopping  Buy products labeled as "low-sodium" or "no salt added."  Buy fresh foods. Avoid canned foods and  premade or frozen meals. Cooking  Avoid adding salt when cooking. Use salt-free seasonings or herbs instead of table salt or sea salt. Check with your health care provider or pharmacist before using salt substitutes.  Do not fry foods. Cook foods using healthy methods such as baking, boiling, grilling, and broiling instead.  Cook with heart-healthy oils, such as olive, canola, soybean, or sunflower oil. Meal planning  Eat a balanced diet that includes: ? 5 or more servings of fruits and vegetables each day. At each meal, try to fill half of your plate with fruits and vegetables. ? Up to 6-8 servings of whole grains each day. ? Less than 6 oz of lean meat, poultry, or fish each day. A 3-oz serving of meat is about the same size as a deck of cards. One egg equals 1 oz. ? 2 servings of low-fat dairy each day. ? A serving of nuts, seeds, or beans 5 times each week. ? Heart-healthy fats. Healthy fats called Omega-3 fatty acids are found in foods such as flaxseeds and coldwater fish, like sardines, salmon, and mackerel.  Limit how much you eat of the following: ? Canned or prepackaged foods. ? Food that is high in trans fat, such as fried foods. ? Food that is high in saturated fat, such as fatty meat. ? Sweets, desserts, sugary drinks, and other foods with added sugar. ? Full-fat dairy products.  Do not salt foods before eating.  Try to eat at least 2 vegetarian meals each week.  Eat more home-cooked food and less restaurant, buffet, and fast food.  When eating at a restaurant, ask that your food be prepared with less salt or no salt, if possible. What foods are recommended? The items listed may not be a complete list. Talk with your  dietitian about what dietary choices are best for you. Grains Whole-grain or whole-wheat bread. Whole-grain or whole-wheat pasta. Brown rice. [place]. Bulgur. Whole-grain and low-sodium cereals. Pita bread. Low-fat, low-sodium crackers. Whole-wheat flour tortillas. Vegetables Fresh or frozen vegetables (raw, steamed, roasted, or grilled). Low-sodium or reduced-sodium tomato and vegetable juice. Low-sodium or reduced-sodium tomato sauce and tomato paste. Low-sodium or reduced-sodium canned vegetables. Fruits All fresh, dried, or frozen fruit. Canned fruit in natural juice (without added sugar). Meat and other protein foods Skinless chicken or [place]. Ground chicken or [place]. Pork with fat trimmed off. Fish and seafood. Egg whites. Dried beans, peas, or lentils. Unsalted nuts, nut butters, and seeds. Unsalted canned beans. Lean cuts of beef with fat trimmed off. Low-sodium, lean deli meat. Dairy Low-fat (1%) or fat-free (skim) milk. Fat-free, low-fat, or reduced-fat cheeses. Nonfat, low-sodium ricotta or cottage cheese. Low-fat or nonfat yogurt. Low-fat, low-sodium cheese. Fats and oils Soft margarine without trans fats. Vegetable oil. Low-fat, reduced-fat, or light mayonnaise and salad dressings (reduced-sodium). Canola, safflower, olive, soybean, and sunflower oils. Avocado. Seasoning and other foods Herbs. Spices. Seasoning mixes without salt. Unsalted popcorn and pretzels. Fat-free sweets. What foods are not recommended? The items listed may not be a complete list. Talk with your dietitian about what dietary choices are best for you. Grains Baked goods made with fat, such as croissants, muffins, or some breads. Dry pasta or rice meal packs. Vegetables Creamed or fried vegetables. Vegetables in a cheese sauce. Regular canned vegetables (not low-sodium or reduced-sodium). Regular canned tomato sauce and paste (not low-sodium or reduced-sodium). Regular tomato and vegetable juice (not  low-sodium or reduced-sodium). Angie Fava. Olives. Fruits Canned fruit in a light or heavy syrup. Fried fruit. Fruit  in cream or butter sauce. Meat and other protein foods Fatty cuts of meat. Ribs. Fried meat. Berniece Salines. Sausage. Bologna and other processed lunch meats. Salami. Fatback. Hotdogs. Bratwurst. Salted nuts and seeds. Canned beans with added salt. Canned or smoked fish. Whole eggs or egg yolks. Chicken or [place] with skin. Dairy Whole or 2% milk, cream, and half-and-half. Whole or full-fat cream cheese. Whole-fat or sweetened yogurt. Full-fat cheese. Nondairy creamers. Whipped toppings. Processed cheese and cheese spreads. Fats and oils Butter. Stick margarine. Lard. Shortening. Ghee. Bacon fat. Tropical oils, such as coconut, palm kernel, or palm oil. Seasoning and other foods Salted popcorn and pretzels. Onion salt, garlic salt, seasoned salt, table salt, and sea salt. Worcestershire sauce. Tartar sauce. Barbecue sauce. Teriyaki sauce. Soy sauce, including reduced-sodium. Steak sauce. Canned and packaged gravies. Fish sauce. Oyster sauce. Cocktail sauce. Horseradish that you find on the shelf. Ketchup. Mustard. Meat flavorings and tenderizers. Bouillon cubes. Hot sauce and Tabasco sauce. Premade or packaged marinades. Premade or packaged taco seasonings. Relishes. Regular salad dressings. Where to find more information:  National Heart, Lung, and [place]: https://wilson-eaton.com/  American Heart Association: www.heart.org Summary  The DASH eating plan is a healthy eating plan that has been shown to reduce high blood pressure (hypertension). It may also reduce your risk for type 2 diabetes, heart disease, and stroke.  With the DASH eating plan, you should limit salt (sodium) intake to 2,300 mg a day. If you have hypertension, you may need to reduce your sodium intake to 1,500 mg a day.  When on the DASH eating plan, aim to eat more fresh fruits and vegetables, whole grains, lean proteins,  low-fat dairy, and heart-healthy fats.  Work with your health care provider or diet and nutrition specialist (dietitian) to adjust your eating plan to your individual calorie needs. This information is not intended to replace advice given to you by your health care provider. Make sure you discuss any questions you have with your health care provider. Document Revised: 10/09/2017 Document Reviewed: 10/20/2016 Elsevier Patient Education  [place].  Low-Sodium Eating Plan Sodium, which is an element that makes up salt, helps you maintain a healthy balance of fluids in your body. Too much sodium can increase your blood pressure and cause fluid and waste to be held in your body. Your health care provider or dietitian may recommend following this plan if you have high blood pressure (hypertension), kidney disease, liver disease, or heart failure. Eating less sodium can help lower your blood pressure, reduce swelling, and protect your heart, liver, and kidneys. What are tips for following this plan? General guidelines  Most people on this plan should limit their sodium intake to 1,500-2,000 mg (milligrams) of sodium each day. Reading food labels   The Nutrition Facts label lists the amount of sodium in one serving of the food. If you eat more than one serving, you must multiply the listed amount of sodium by the number of servings.  Choose foods with less than 140 mg of sodium per serving.  Avoid foods with 300 mg of sodium or more per serving. Shopping  Look for lower-sodium products, often labeled as "low-sodium" or "no salt added."  Always check the sodium content even if foods are labeled as "unsalted" or "no salt added".  Buy fresh foods. ? Avoid canned foods and premade or frozen meals. ? Avoid canned, cured, or processed meats  Buy breads that have less than 80 mg of sodium per slice. Cooking  Eat more  home-cooked food and less restaurant, buffet, and fast food.  Avoid  adding salt when cooking. Use salt-free seasonings or herbs instead of table salt or sea salt. Check with your health care provider or pharmacist before using salt substitutes.  Cook with plant-based oils, such as canola, sunflower, or olive oil. Meal planning  When eating at a restaurant, ask that your food be prepared with less salt or no salt, if possible.  Avoid foods that contain MSG (monosodium glutamate). MSG is sometimes added to [place] food, bouillon, and some canned foods. What foods are recommended? The items listed may not be a complete list. Talk with your dietitian about what dietary choices are best for you. Grains Low-sodium cereals, including oats, puffed wheat and rice, and shredded wheat. Low-sodium crackers. Unsalted rice. Unsalted pasta. Low-sodium bread. Whole-grain breads and whole-grain pasta. Vegetables Fresh or frozen vegetables. "No salt added" canned vegetables. "No salt added" tomato sauce and paste. Low-sodium or reduced-sodium tomato and vegetable juice. Fruits Fresh, frozen, or canned fruit. Fruit juice. Meats and other protein foods Fresh or frozen (no salt added) meat, poultry, seafood, and fish. Low-sodium canned tuna and salmon. Unsalted nuts. Dried peas, beans, and lentils without added salt. Unsalted canned beans. Eggs. Unsalted nut butters. Dairy Milk. Soy milk. Cheese that is naturally low in sodium, such as ricotta cheese, fresh mozzarella, or Swiss cheese Low-sodium or reduced-sodium cheese. Cream cheese. Yogurt. Fats and oils Unsalted butter. Unsalted margarine with no trans fat. Vegetable oils such as canola or olive oils. Seasonings and other foods Fresh and dried herbs and spices. Salt-free seasonings. Low-sodium mustard and ketchup. Sodium-free salad dressing. Sodium-free light mayonnaise. Fresh or refrigerated horseradish. Lemon juice. Vinegar. Homemade, reduced-sodium, or low-sodium soups. Unsalted popcorn and pretzels. Low-salt or salt-free  chips. What foods are not recommended? The items listed may not be a complete list. Talk with your dietitian about what dietary choices are best for you. Grains Instant hot cereals. Bread stuffing, pancake, and biscuit mixes. Croutons. Seasoned rice or pasta mixes. Noodle soup cups. Boxed or frozen macaroni and cheese. Regular salted crackers. Self-rising flour. Vegetables Sauerkraut, pickled vegetables, and relishes. Olives. [place] fries. Onion rings. Regular canned vegetables (not low-sodium or reduced-sodium). Regular canned tomato sauce and paste (not low-sodium or reduced-sodium). Regular tomato and vegetable juice (not low-sodium or reduced-sodium). Frozen vegetables in sauces. Meats and other protein foods Meat or fish that is salted, canned, smoked, spiced, or pickled. Bacon, ham, sausage, hotdogs, corned beef, chipped beef, packaged lunch meats, salt pork, jerky, pickled herring, anchovies, regular canned tuna, sardines, salted nuts. Dairy Processed cheese and cheese spreads. Cheese curds. Blue cheese. Feta cheese. String cheese. Regular cottage cheese. Buttermilk. Canned milk. Fats and oils Salted butter. Regular margarine. Ghee. Bacon fat. Seasonings and other foods Onion salt, garlic salt, seasoned salt, table salt, and sea salt. Canned and packaged gravies. Worcestershire sauce. Tartar sauce. Barbecue sauce. Teriyaki sauce. Soy sauce, including reduced-sodium. Steak sauce. Fish sauce. Oyster sauce. Cocktail sauce. Horseradish that you find on the shelf. Regular ketchup and mustard. Meat flavorings and tenderizers. Bouillon cubes. Hot sauce and Tabasco sauce. Premade or packaged marinades. Premade or packaged taco seasonings. Relishes. Regular salad dressings. Salsa. Potato and tortilla chips. Corn chips and puffs. Salted popcorn and pretzels. Canned or dried soups. Pizza. Frozen entrees and pot pies. Summary  Eating less sodium can help lower your blood pressure, reduce swelling, and  protect your heart, liver, and kidneys.  Most people on this plan should limit their sodium intake to 1,500-2,000  mg (milligrams) of sodium each day.  Canned, boxed, and frozen foods are high in sodium. Restaurant foods, fast foods, and pizza are also very high in sodium. You also get sodium by adding salt to food.  Try to cook at home, eat more fresh fruits and vegetables, and eat less fast food, canned, processed, or prepared foods. This information is not intended to replace advice given to you by your health care provider. Make sure you discuss any questions you have with your health care provider. Document Revised: 10/09/2017 Document Reviewed: 10/20/2016 Elsevier Patient Education  2020 [employer].

## 2019-12-08 NOTE — Progress Notes (Signed)
Telephone Note  I connected with Cynthia Burke   on 12/08/19 at  8:30 AM EST telephone and verified that I am speaking with the correct person using two identifiers.  Location patient: home Location provider:work or home office Persons participating in the virtual visit: patient, provider  I discussed the limitations of evaluation and management by telemedicine and the availability of in person appointments. The patient expressed understanding and agreed to proceed.   HPI: 1. HTN BP elevated >130/80 consistently taking BP at home getting h/a but no dizziness FH HTN. Last appt 136/88  2. Stress increased due to increased work load and drinking 3-4 cups of coffee per day  3. Thyroid nodules seen endocrine 10/28/19 TSH normal see below  4. C/o joint pain worse in hands  and rash and had bx 12/06/19 with dermatology  5. Allergies worse in spring wants refill of singulair   ROS: See pertinent positives and negatives per HPI.  Past Medical History:  Diagnosis Date  . ADD (attention deficit disorder)   . Allergy   . Anemia   . Asthma   . Depression   . Hashimoto's disease   . Hashimoto's thyroiditis   . History of chicken pox   . History of UTI   . Hyperlipidemia   . Premature ovarian insufficiency    [facility] 29 Dr. Hughie Closs   . Seizures ([place])    medication induced wellbutrin    Past Surgical History:  Procedure Laterality Date  . ABDOMINAL HYSTERECTOMY     Dr. Gerarda Fraction 11-04-18  . DILATION AND CURETTAGE OF UTERUS     2015 and 2018   . HYSTERECTOMY ABDOMINAL WITH SALPINGECTOMY Bilateral 11/04/2018   Procedure: HYSTERECTOMY ABDOMINAL WITH BILATERAL SALPINGECTOMY, Exploratory laparotomy;  Surgeon: Isabel Caprice, MD;  Location: [facility];  Service: Gynecology;  Laterality: Bilateral;  . MYOMECTOMY     2015     Family History  Problem Relation Age of Onset  . Rheum arthritis Mother   . Hashimoto's thyroiditis Mother   . Hypertension Mother   . Hyperlipidemia Mother   . Arthritis Mother    . Asthma Mother   . Depression Mother   . Kidney disease Mother   . Miscarriages / [place] Mother   . Diabetes Mellitus II Father   . Hypothyroidism Father   . Hypertension Father   . Coronary artery disease Father   . Hyperlipidemia Father   . Arthritis Father   . COPD Father   . Depression Father   . Diabetes Father   . Heart disease Father   . Kidney disease Father   . Bladder Cancer Father   . Prostate cancer Father   . Stroke Maternal Grandmother   . Heart disease Maternal Grandmother   . Breast cancer Paternal Grandmother 28  . Early death Paternal Grandmother   . Cancer Paternal Grandmother        breast died   . Miscarriages / Stillbirths Paternal Grandmother   . Learning disabilities Brother   . Hypertension Brother   . Depression Brother   . Alcohol abuse Maternal Grandfather   . Early death Maternal Grandfather   . Heart disease Paternal Grandfather     SOCIAL HX:  Married  PT Has pets    Current Outpatient Medications:  .  albuterol (PROVENTIL HFA;VENTOLIN HFA) 108 (90 Base) MCG/ACT inhaler, Inhale 1-2 puffs into the lungs every 6 (six) hours as needed for wheezing or shortness of breath., Disp: 1 Inhaler, Rfl: 12 .  Amphet-Dextroamphet 3-Bead ER (MYDAYIS)  50 MG CP24, Take 50 mg by mouth daily. [place]da[place], Disp: , Rfl:  .  azelastine (ASTELIN) 0.1 % nasal spray, Place 2 sprays into both nostrils 2 (two) times daily as needed for allergies. , Disp: , Rfl: 8 .  Calcium Carbonate-Vit D-Min (CALTRATE 600+D PLUS PO), Take 1 capsule by mouth daily., Disp: , Rfl:  .  Cholecalciferol (VITAMIN D3) 25 MCG (1000 UT) CAPS, Take 2,000 Units by mouth daily., Disp: , Rfl:  .  Coenzyme Q10 (COQ10) 100 MG CAPS, Take 900 mg by mouth 2 (two) times daily. , Disp: , Rfl:  .  Desvenlafaxine Succinate ER 25 MG TB24, , Disp: , Rfl:  .  FLUoxetine (PROZAC) 40 MG capsule, Take 40 mg by mouth daily. , Disp: , Rfl:  .  linaclotide (LINZESS) 290 MCG CAPS capsule, Take 1 capsule (290  mcg total) by mouth daily before breakfast., Disp: 90 capsule, Rfl: 3 .  Magnesium 250 MG TABS, Take 1 tablet by mouth daily., Disp: , Rfl:  .  Multiple Vitamins-Minerals (MULTIVITAMIN ADULT) CHEW, Chew 2 each by mouth daily., Disp: , Rfl:  .  thyroid (ARMOUR) 90 MG tablet, Take 90 mg by mouth daily. Patient takes 90 mg 6 days 45 mg on Sunday., Disp: , Rfl:  .  UNABLE TO FIND, Med Name: estrogen patch 0.5 2x per week, Disp: , Rfl:  .  amLODipine (NORVASC) 2.5 MG tablet, Take 1 tablet (2.5 mg total) by mouth daily., Disp: 90 tablet, Rfl: 3 .  montelukast (SINGULAIR) 10 MG tablet, Take 1 tablet (10 mg total) by mouth at bedtime., Disp: 90 tablet, Rfl: 3  EXAM:  VITALS per patient if applicable:  GENERAL: alert, oriented, appears well and in no acute distress  HEENT: atraumatic, conjunttiva clear, no obvious abnormalities on inspection of external nose and ears  NECK: normal movements of the head and neck  LUNGS: on inspection no signs of respiratory distress, breathing rate appears normal, no obvious gross SOB, gasping or wheezing  CV: no obvious cyanosis  MS: moves all visible extremities without noticeable abnormality  PSYCH/NEURO: pleasant and cooperative, no obvious depression or anxiety, speech and thought processing grossly intact  ASSESSMENT AND PLAN:  Discussed the following assessment and plan:  Essential hypertension - Plan: amLODipine (NORVASC) 2.5 MG tablet Goal <130/<80 rec reduce caffeine speak with boss about lighter work schedule   Allergic rhinitis, unspecified seasonality, unspecified trigger - Plan: montelukast (SINGULAIR) 10 MG tablet  Arthralgia and rash Pending bx dermatology had 12/06/19  Needs to get labs [facility] 07/2019   HM Fasting labs [facility] asap  utd flu had in 2019 and Tdap Referred 12/28/2018 mammo repeat  Negative, ordered mammo 2021  -prior mammogram 10/03/13 normal Never smoker Pap 04/16/17 neg pap neg HPVPFW s/p hysterectomy 11/04/18 with  uterus cervix and b/l fallopian tubes removed   Right thyroid FNA [id number] benign follicular cells, rare oncocytic metaplastic cells and histiocytes consistent with MNG (benign thyriod nodules) neg malignancy. Thyroid US 09/12/13 thyroiditis, solitary nodule right thyroid increased in size -f/u [facility] endocrine Dr. Gabriel Carina tsh 10/28/19 1.819  -we discussed possible serious and likely etiologies, options for evaluation and workup, limitations of telemedicine visit vs in person visit, treatment, treatment risks and precautions. Pt prefers to treat via telemedicine empirically rather then risking or undertaking an in person visit at this moment. Patient agrees to seek prompt in person care if worsening, new symptoms arise, or if is not improving with treatment.   I discussed the assessment and treatment plan  with the patient. The patient was provided an opportunity to ask questions and all were answered. The patient agreed with the plan and demonstrated an understanding of the instructions.   The patient was advised to call back or seek an in-person evaluation if the symptoms worsen or if the condition fails to improve as anticipated.  Time spent 21 minutes  Delorise Jackson, MD

## 2019-12-08 NOTE — Addendum Note (Signed)
Addended by: Orland Mustard on: 12/08/2019 08:58 AM   Modules accepted: Orders

## 2020-02-21 ENCOUNTER — Other Ambulatory Visit: Payer: Self-pay | Admitting: Internal Medicine

## 2020-02-21 DIAGNOSIS — J4520 Mild intermittent asthma, uncomplicated: Secondary | ICD-10-CM

## 2020-02-21 MED ORDER — ALBUTEROL SULFATE HFA 108 (90 BASE) MCG/ACT IN AERS: 1.0000 | INHALATION_SPRAY | Freq: Four times a day (QID) | RESPIRATORY_TRACT | 12 refills | Status: DC | PRN

## 2020-02-27 ENCOUNTER — Encounter: Payer: Self-pay | Admitting: Nurse Practitioner

## 2020-02-27 ENCOUNTER — Telehealth: Payer: BC Managed Care – PPO | Admitting: Nurse Practitioner

## 2020-02-27 VITALS — BP 129/77 | Ht 67.0 in | Wt 162.0 lb

## 2020-02-27 DIAGNOSIS — F329 Major depressive disorder, single episode, unspecified: Secondary | ICD-10-CM

## 2020-02-27 DIAGNOSIS — I10 Essential (primary) hypertension: Secondary | ICD-10-CM

## 2020-02-27 DIAGNOSIS — F419 Anxiety disorder, unspecified: Secondary | ICD-10-CM

## 2020-02-27 DIAGNOSIS — R21 Rash and other nonspecific skin eruption: Secondary | ICD-10-CM

## 2020-02-27 DIAGNOSIS — F32A Depression, unspecified: Secondary | ICD-10-CM

## 2020-02-27 DIAGNOSIS — E785 Hyperlipidemia, unspecified: Secondary | ICD-10-CM

## 2020-02-27 DIAGNOSIS — E039 Hypothyroidism, unspecified: Secondary | ICD-10-CM

## 2020-02-27 DIAGNOSIS — M2550 Pain in unspecified joint: Secondary | ICD-10-CM

## 2020-02-27 NOTE — Progress Notes (Signed)
Virtual Visit via Video Note  I connected with@ on 02/27/20 at  1:00 PM EDT by a video enabled telemedicine application and verified that I am speaking with the correct person using two identifiers. This visit type was conducted due to national recommendations for restrictions regarding the COVID-19 Pandemic (e.g. social distancing).  This format is felt to be most appropriate for this patient at this time.   I discussed the limitations of evaluation and management by telemedicine and the availability of in person appointments. The patient expressed understanding and agreed to proceed.  Only the patient and myself were on today's video visit. The patient was at home and I was in my office at the time of today's visit.   History of Present Illness: Patient reports ongoing rash previously evaluated by Dr. Evorn Gong in dermatology with  punch biopsy showing nonspecific findings according to her understanding.  She had a telehealth visit and follow-up with  dermatologist, Dr. Lowella Curb.  Levada Dy reports that they are thinking this might be an insect bite type rash.  She had exterminators examine and spray  her house and there is no findings of insects or bedbugs.  Patient still gets a red itchy rash on her right forearm, patchy areas on her back, elbow, few spots on her legs, and she has tried the creams ordered by dermatology without resolution.  She also reports chronic fatigue, always feels tired, and feels sleepy throughout the day.  No history or symptoms of sleep apnea that she can tell.  She takes Benadryl at night that helps her itchy rash and believes helps her sleep.  She gets joint pain intermittently in her hands, fingers and thumb, and feels discomfort in her neck and upper arms when she looks down at her tablets.  Her fatigue was evaluated 07/2019 by Dr. Terese Door.  A series of blood work was ordered but was not completed.  She denies fevers, chills, weight loss, cough, chest pain, and she  rarely needs to take albuterol. Diagnosed with asthma couple years ago when it is not problematic.  She has noted no wheezing, shortness of breath.  She has history of Hashimoto's and hypothyroidism. TSH 1.819 on 10/28/2019.   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  She is followed by psychiatry and last saw Dr.Kaur in a virtual visit November/December.  She is taking her Prozac as instructed.   Observations/Objective:  Gen: Patient begins a conversation cheerful, and talking about stressors at work.  As we get further along, she regains emotional composure, and is awake, alert, no acute distress.  Positive depressed affect. Resp: Breathing is even and non-labored Psych: calm/pleasant demeanor Neuro: Alert and Oriented x 3, + facial symmetry, speech is clear. SKIN: She shows me her red macular type rash, and then she will have tiny scabs noted about the elbows upper arm.  Assessment and Plan: Rash: This rash has been investigated by dermatology.  She has been prescribed different creams.  Advice was to call back if she showed no improvement.  She needs to have patient was advised by me to contact dermatology let them know that the treatment has not resolved her problem.  Fatigue, joint pain: Laboratory studies ordered today to look for inflammatory or infectious cause.  Will check for tickborne illness.  Request for rheumatology consult.  Depression/anxiety: Continue with Prozac, and follow-up with Dr. Toy Care.  Advised to make an appointment with him as  soon as possible.  She denies any suicidal thoughts, homicidal thoughts.  She reports good support from her husband.  She is safe and happy at home.  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  Please see your PCP in the next few weeks to.a month.   Follow Up  Instructions:    I discussed the assessment and treatment plan with the patient. The patient was provided an opportunity to ask questions and all were answered. The patient agreed with the plan and demonstrated an understanding of the instructions.   The patient was advised to call back or seek an in-person evaluation if the symptoms worsen or if the condition fails to improve as anticipated.    Denice Paradise, NP

## 2020-02-28 ENCOUNTER — Telehealth: Payer: Self-pay | Admitting: Internal Medicine

## 2020-02-28 ENCOUNTER — Other Ambulatory Visit: Payer: Self-pay

## 2020-02-28 ENCOUNTER — Other Ambulatory Visit
Admission: RE | Admit: 2020-02-28 | Discharge: 2020-02-28 | Disposition: A | Discharge status: Home / Self Care | Payer: BC Managed Care – PPO | Attending: Nurse Practitioner | Admitting: Nurse Practitioner

## 2020-02-28 DIAGNOSIS — M2550 Pain in unspecified joint: Secondary | ICD-10-CM | POA: Insufficient documentation

## 2020-02-28 DIAGNOSIS — R21 Rash and other nonspecific skin eruption: Secondary | ICD-10-CM

## 2020-02-28 DIAGNOSIS — I10 Essential (primary) hypertension: Secondary | ICD-10-CM

## 2020-02-28 LAB — COMPREHENSIVE METABOLIC PANEL
ALT: 19 U/L (ref 0–44)
AST: 23 U/L (ref 15–41)
Albumin: 4.2 g/dL (ref 3.5–5.0)
Alkaline Phosphatase: 37 U/L — ABNORMAL LOW (ref 38–126)
Anion gap: 7 (ref 5–15)
BUN: 14 mg/dL (ref 6–20)
CO2: 23 mmol/L (ref 22–32)
Calcium: 8.8 mg/dL — ABNORMAL LOW (ref 8.9–10.3)
Chloride: 104 mmol/L (ref 98–111)
Creatinine, Ser: 0.83 mg/dL (ref 0.44–1.00)
GFR calc Af Amer: >60 mL/min (ref >60)
GFR calc non Af Amer: >60 mL/min (ref >60)
Glucose, Bld: 108 mg/dL — ABNORMAL HIGH (ref 70–99)
Potassium: 3.9 mmol/L (ref 3.5–5.1)
Sodium: 134 mmol/L — ABNORMAL LOW (ref 135–145)
Total Bilirubin: 0.3 mg/dL (ref 0.3–1.2)
Total Protein: 7.3 g/dL (ref 6.5–8.1)

## 2020-02-28 LAB — FERRITIN: Ferritin: 12 ng/mL (ref 11–307)

## 2020-02-28 LAB — C-REACTIVE PROTEIN: CRP: <0.5 mg/dL (ref <1.0)

## 2020-02-28 LAB — SEDIMENTATION RATE: Sed Rate: 7 mm/hr (ref 0–20)

## 2020-02-28 LAB — URIC ACID: Uric Acid, Serum: 2.3 mg/dL — ABNORMAL LOW (ref 2.5–7.1)

## 2020-02-28 LAB — CK: Total CK: 64 U/L (ref 38–234)

## 2020-02-28 NOTE — Telephone Encounter (Signed)
Pt would like to know if she needs to stay out from work another day. She had rapid test done but she doesn't have the results and she is also waiting on lab results.

## 2020-02-28 NOTE — Patient Instructions (Addendum)
Rash: Please call dermatology to let them know that the treatment has not resolved her problem.  Fatigue, joint pain: Laboratory studies ordered today to look for inflammatory or infectious cause.  Will check for tickborne illness.  Request for rheumatology consult.  Depression/anxiety: Continue with Prozac, and follow-up with Dr. Toy Care.  Advised to make an appointment with him as soon as possible.   Office visit with Dr. Olivia Mackie in a few weeks.

## 2020-02-28 NOTE — Telephone Encounter (Signed)
You saw patient & she does not have Covid results back yet. Should patient stay out of work until results come back in?

## 2020-02-29 ENCOUNTER — Telehealth: Payer: Self-pay | Admitting: Nurse Practitioner

## 2020-02-29 LAB — LYME AB/WESTERN BLOT REFLEX
B burgdorferi Ab IgG+IgM: <0.91 {ISR} (ref 0.00–0.90)
LYME DISEASE AB, QUANT, IGM: <0.8 index (ref 0.00–0.79)

## 2020-02-29 LAB — GLIA (IGA/G) + TTG IGA
Antigliadin Abs, IgA: 3 units (ref 0–19)
Gliadin IgG: 2 units (ref 0–19)
Tissue Transglutaminase Ab, IgA: <2 U/mL (ref 0–3)

## 2020-02-29 LAB — ANA: Anti Nuclear Antibody (ANA): NEGATIVE

## 2020-02-29 NOTE — Telephone Encounter (Signed)
Letter sent that patient was seen in the clinic on 02/27/2020.   She was advised to get a copy of her Covid test done at [facility] in [facility] and show her employer.

## 2020-02-29 NOTE — Telephone Encounter (Signed)
Test results came back in negative and she wants to know if she can get her return to work note to go back to work on tomorrow? She said please put the letter in [place].

## 2020-03-01 ENCOUNTER — Telehealth: Payer: Self-pay | Admitting: Nurse Practitioner

## 2020-03-01 ENCOUNTER — Encounter: Payer: Self-pay | Admitting: Internal Medicine

## 2020-03-01 LAB — ROCKY MTN SPOTTED FVR ABS PNL(IGG+IGM)
RMSF IgG: NEGATIVE
RMSF IgM: 0.24 index (ref 0.00–0.89)

## 2020-03-01 NOTE — Telephone Encounter (Signed)
Close  

## 2020-03-03 ENCOUNTER — Ambulatory Visit: Payer: BC Managed Care – PPO | Attending: Internal Medicine

## 2020-03-03 ENCOUNTER — Other Ambulatory Visit: Payer: Self-pay

## 2020-03-03 DIAGNOSIS — Z23 Encounter for immunization: Secondary | ICD-10-CM

## 2020-03-03 NOTE — Progress Notes (Signed)
   Covid-19 Vaccination Clinic  Name:  Cynthia Burke    MRN: [id number] DOB: [birth year]  03/03/2020  [name] was observed post Covid-19 immunization for 15 minutes without incident. She was provided with Vaccine Information Sheet and instruction to access the V-Safe system.   [name] was instructed to call 911 with any severe reactions post vaccine: Marland Kitchen Difficulty breathing  . Swelling of face and throat  . A fast heartbeat  . A bad rash all over body  . Dizziness and weakness   Immunizations Administered    Name Date Dose VIS Date Route   Pfizer COVID-19 Vaccine 03/03/2020 11:17 AM 0.3 mL 01/04/2019 Intramuscular   Manufacturer: [employer], [employer]   Lot: [id number]   [place]: [id number]

## 2020-03-08 ENCOUNTER — Encounter: Payer: Self-pay | Admitting: Internal Medicine

## 2020-03-08 ENCOUNTER — Other Ambulatory Visit: Payer: Self-pay

## 2020-03-08 ENCOUNTER — Ambulatory Visit (INDEPENDENT_AMBULATORY_CARE_PROVIDER_SITE_OTHER): Payer: BC Managed Care – PPO | Admitting: Internal Medicine

## 2020-03-08 VITALS — BP 136/86 | HR 101 | Temp 99.2°F | Ht 67.0 in | Wt 170.8 lb

## 2020-03-08 DIAGNOSIS — I10 Essential (primary) hypertension: Secondary | ICD-10-CM | POA: Diagnosis not present

## 2020-03-08 DIAGNOSIS — F419 Anxiety disorder, unspecified: Secondary | ICD-10-CM | POA: Diagnosis not present

## 2020-03-08 DIAGNOSIS — F32A Depression, unspecified: Secondary | ICD-10-CM

## 2020-03-08 DIAGNOSIS — F439 Reaction to severe stress, unspecified: Secondary | ICD-10-CM

## 2020-03-08 DIAGNOSIS — R21 Rash and other nonspecific skin eruption: Secondary | ICD-10-CM | POA: Insufficient documentation

## 2020-03-08 DIAGNOSIS — F329 Major depressive disorder, single episode, unspecified: Secondary | ICD-10-CM

## 2020-03-08 DIAGNOSIS — L700 Acne vulgaris: Secondary | ICD-10-CM

## 2020-03-08 DIAGNOSIS — E2839 Other primary ovarian failure: Secondary | ICD-10-CM

## 2020-03-08 DIAGNOSIS — L709 Acne, unspecified: Secondary | ICD-10-CM | POA: Insufficient documentation

## 2020-03-08 DIAGNOSIS — Z1231 Encounter for screening mammogram for malignant neoplasm of breast: Secondary | ICD-10-CM | POA: Diagnosis not present

## 2020-03-08 DIAGNOSIS — E039 Hypothyroidism, unspecified: Secondary | ICD-10-CM

## 2020-03-08 HISTORY — DX: Rash and other nonspecific skin eruption: R21

## 2020-03-08 MED ORDER — SPIRONOLACTONE 25 MG PO TABS: 12.5000 mg | ORAL_TABLET | Freq: Every day | ORAL | 0 refills | Status: DC

## 2020-03-08 NOTE — Patient Instructions (Addendum)
Multivitamin or calcium carbonate 600 mg to 1200 mg total separately   04/27/2020 Ancillary Orders Lab Solum, Felipa Evener, MD  [place]  [facility] [place], [place] [address]  [phone]  [phone]    05/04/2020 Office Visit Endocrinology Solum, Felipa Evener, MD  [place]  [facility] [place], [place] [address]  [phone]  [phone] (Fax)        Exercising to Lose Weight Exercise is structured, repetitive physical activity to improve fitness and health. Getting regular exercise is important for everyone. It is especially important if you are overweight. Being overweight increases your risk of heart disease, stroke, diabetes, high blood pressure, and several types of cancer. Reducing your calorie intake and exercising can help you lose weight. Exercise is usually categorized as moderate or vigorous intensity. To lose weight, most people need to do a certain amount of moderate-intensity or vigorous-intensity exercise each week. Moderate-intensity exercise  Moderate-intensity exercise is any activity that gets you moving enough to burn at least three times more energy (calories) than if you were sitting. Examples of moderate exercise include:  Walking a mile in 15 minutes.  Doing light yard work.  Biking at an easy pace. Most people should get at least 150 minutes (2 hours and 30 minutes) a week of moderate-intensity exercise to maintain their body weight. Vigorous-intensity exercise Vigorous-intensity exercise is any activity that gets you moving enough to burn at least six times more calories than if you were sitting. When you exercise at this intensity, you should be working hard enough that you are not able to carry on a conversation. Examples of vigorous exercise include:  Running.  Playing a team sport, such as football, basketball, and soccer.  Jumping rope. Most people should get at least 75 minutes (1  hour and 15 minutes) a week of vigorous-intensity exercise to maintain their body weight. How can exercise affect me? When you exercise enough to burn more calories than you eat, you lose weight. Exercise also reduces body fat and builds muscle. The more muscle you have, the more calories you burn. Exercise also:  Improves mood.  Reduces stress and tension.  Improves your overall fitness, flexibility, and endurance.  Increases bone strength. The amount of exercise you need to lose weight depends on:  Your age.  The type of exercise.  Any health conditions you have.  Your overall physical ability. Talk to your health care provider about how much exercise you need and what types of activities are safe for you. What actions can I take to lose weight? Nutrition   Make changes to your diet as told by your health care provider or diet and nutrition specialist (dietitian). This may include: ? Eating fewer calories. ? Eating more protein. ? Eating less unhealthy fats. ? Eating a diet that includes fresh fruits and vegetables, whole grains, low-fat dairy products, and lean protein. ? Avoiding foods with added fat, salt, and sugar.  Drink plenty of water while you exercise to prevent dehydration or heat stroke. Activity  Choose an activity that you enjoy and set realistic goals. Your health care provider can help you make an exercise plan that works for you.  Exercise at a moderate or vigorous intensity most days of the week. ? The intensity of exercise may vary from person to person. You can tell how intense a workout is for you by paying attention to your breathing and heartbeat. Most people will notice their breathing and heartbeat get  faster with more intense exercise.  Do resistance training twice each week, such as: ? Push-ups. ? Sit-ups. ? Lifting weights. ? Using resistance bands.  Getting short amounts of exercise can be just as helpful as long structured periods of  exercise. If you have trouble finding time to exercise, try to include exercise in your daily routine. ? Get up, stretch, and walk around every 30 minutes throughout the day. ? Go for a walk during your lunch break. ? Park your car farther away from your destination. ? If you take public transportation, get off one stop early and walk the rest of the way. ? Make phone calls while standing up and walking around. ? Take the stairs instead of elevators or escalators.  Wear comfortable clothes and shoes with good support.  Do not exercise so much that you hurt yourself, feel dizzy, or get very short of breath. Where to find more information  U.S. Department of Health and Human Services: [employer]at  Centers for Disease Control and Prevention (CDC): http://www.wolf.info/ Contact a health care provider:  Before starting a new exercise program.  If you have questions or concerns about your weight.  If you have a medical problem that keeps you from exercising. Get help right away if you have any of the following while exercising:  Injury.  Dizziness.  Difficulty breathing or shortness of breath that does not go away when you stop exercising.  Chest pain.  Rapid heartbeat. Summary  Being overweight increases your risk of heart disease, stroke, diabetes, high blood pressure, and several types of cancer.  Losing weight happens when you burn more calories than you eat.  Reducing the amount of calories you eat in addition to getting regular moderate or vigorous exercise each week helps you lose weight. This information is not intended to replace advice given to you by your health care provider. Make sure you discuss any questions you have with your health care provider. Document Revised: 11/09/2017 Document Reviewed: 11/09/2017 Elsevier Patient Education  2020 [employer].

## 2020-03-08 NOTE — Progress Notes (Signed)
Chief Complaint  Patient presents with  . Follow-up   F/u  1. Elevated BP stopped norvasc 2.5 mg qd due to swelling and did not seen effective  2. 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 Rash to arms since 09/2019 f/u with Dr. Evorn Gong and bx'ed ? Arthropod with eosinophils rash is not better titers for lyme and rmsf normal  He is thinking about plaquenil rash is not responding to steroid creams given by derm and still itchy  Rheumatology f/u Dr. Meda Coffee sch 5/17 4. Hypothyroidism and thyroid nodules on armour thyroid 90 she reports 1-2 weeks taking in am and pm appt labs 04/27/20 and f/u 05/04/20 but needs  To change due to schedule  Review of Systems  Constitutional: Negative for weight loss.  HENT: Negative for hearing loss.   Eyes: Negative for blurred vision.  Respiratory: Negative for shortness of breath.   Cardiovascular: Negative for chest pain.  Gastrointestinal: Negative for abdominal pain (d).  Musculoskeletal: Negative for falls.  Skin: Positive for itching and rash.  Psychiatric/Behavioral: Positive for depression. The patient is nervous/anxious.    Past Medical History:  Diagnosis Date  . ADD (attention deficit disorder)   . Allergy   . Anemia   . Asthma   . Depression   . Hashimoto's disease   . Hashimoto's thyroiditis   . History of chicken pox   . History of UTI   . Hyperlipidemia   . Premature ovarian insufficiency    [facility] 29 Dr. Hughie Closs   . Seizures ([place])    medication induced wellbutrin   Past Surgical History:  Procedure Laterality Date  . ABDOMINAL HYSTERECTOMY     Dr. Gerarda Fraction 11-04-18  . DILATION AND CURETTAGE OF UTERUS     2015 and 2018   . HYSTERECTOMY ABDOMINAL WITH SALPINGECTOMY Bilateral 11/04/2018   Procedure: HYSTERECTOMY ABDOMINAL WITH BILATERAL SALPINGECTOMY, Exploratory laparotomy;  Surgeon: Isabel Caprice, MD;  Location: [facility];   Service: Gynecology;  Laterality: Bilateral;  . MYOMECTOMY     2015    Family History  Problem Relation Age of Onset  . Rheum arthritis Mother   . Hashimoto's thyroiditis Mother   . Hypertension Mother   . Hyperlipidemia Mother   . Arthritis Mother   . Asthma Mother   . Depression Mother   . Kidney disease Mother   . Miscarriages / [place] Mother   . Diabetes Mellitus II Father   . Hypothyroidism Father   . Hypertension Father   . Coronary artery disease Father   . Hyperlipidemia Father   . Arthritis Father   . COPD Father   . Depression Father   . Diabetes Father   . Heart disease Father   . Kidney disease Father   . Bladder Cancer Father   . Prostate cancer Father   . Stroke Maternal Grandmother   . Heart disease Maternal Grandmother   . Breast cancer Paternal Grandmother 39  . Early death Paternal Grandmother   . Cancer Paternal Grandmother        breast died   . Miscarriages / Stillbirths Paternal Grandmother   . Learning disabilities Brother   . Hypertension Brother   . Depression Brother   . Alcohol abuse Maternal Grandfather   . Early death Maternal Grandfather   . Heart disease Paternal Grandfather    Social History   Socioeconomic History  . Marital status: Married  Spouse name: Not on file  . Number of children: Not on file  . Years of education: Not on file  . Highest education level: Not on file  Occupational History  . Not on file  Tobacco Use  . Smoking status: Never Smoker  . Smokeless tobacco: Never Used  Substance and Sexual Activity  . Alcohol use: No  . Drug use: No  . Sexual activity: Yes    Birth control/protection: None  Other Topics Concern  . Not on file  Social History Narrative   Married    PT   No kids    Social Determinants of Health   Financial Resource Strain:   . Difficulty of Paying Living Expenses:   Food Insecurity:   . Worried About Charity fundraiser in the Last Year:   . Arboriculturist in the Last  Year:   Transportation Needs:   . Film/video editor (Medical):   Marland Kitchen Lack of Transportation (Non-Medical):   Physical Activity:   . Days of Exercise per Week:   . Minutes of Exercise per Session:   Stress:   . Feeling of Stress :   Social Connections:   . Frequency of Communication with Friends and Family:   . Frequency of Social Gatherings with Friends and Family:   . Attends Religious Services:   . Active Member of Clubs or Organizations:   . Attends Archivist Meetings:   Marland Kitchen Marital Status:   Intimate Partner Violence:   . Fear of Current or Ex-Partner:   . Emotionally Abused:   Marland Kitchen Physically Abused:   . Sexually Abused:    Current Meds  Medication Sig  . albuterol (VENTOLIN HFA) 108 (90 Base) MCG/ACT inhaler Inhale 1-2 puffs into the lungs every 6 (six) hours as needed for wheezing or shortness of breath.  . Amphet-Dextroamphet 3-Bead ER (MYDAYIS) 50 MG CP24 Take 50 mg by mouth daily. [place]  . azelastine (ASTELIN) 0.1 % nasal spray Place 2 sprays into both nostrils 2 (two) times daily as needed for allergies.   . Calcium Carbonate-Vit D-Min (CALTRATE 600+D PLUS PO) Take 1 capsule by mouth daily.  . Cetirizine HCl (ZYRTEC PO) Take by mouth.  . Cholecalciferol (VITAMIN D3) 25 MCG (1000 UT) CAPS Take 2,000 Units by mouth daily.  . Coenzyme Q10 (COQ10) 100 MG CAPS Take 900 mg by mouth 2 (two) times daily.   . diphenhydrAMINE HCl (BENADRYL ALLERGY PO) Take by mouth.  Marland Kitchen FLUoxetine (PROZAC) 40 MG capsule Take 40 mg by mouth daily.   . montelukast (SINGULAIR) 10 MG tablet Take 1 tablet (10 mg total) by mouth at bedtime.  Marland Kitchen thyroid (ARMOUR) 90 MG tablet Take 90 mg by mouth daily. Patient takes 90 mg 6 days 45 mg on Sunday.   Allergies  Allergen Reactions  . Norvasc [Amlodipine]     Swelling   . Wellbutrin [Bupropion] Other (See Comments)    Seizures    . Latex Other (See Comments)    Itching, sneezing  . Other Other (See Comments)    Tree pollen     Recent  Results (from the past 2160 hour(s))  Lyme Ab/Western Blot Reflex     Status: None   Collection Time: 02/28/20  9:30 AM  Result Value Ref Range   B burgdorferi Ab IgG+IgM <0.91 0.00 - 0.90 ISR    Comment: (NOTE)  Negative         <0.91                                Equivocal  0.91 - 1.09                                Positive         >1.09    LYME DISEASE AB, QUANT, IGM <0.80 0.00 - 0.79 index    Comment: (NOTE)                                Negative         <0.80                                Equivocal  0.80 - 1.19                                Positive         >1.19 IgM levels may peak at 3-6 weeks post infection, then gradually decline. Performed At: [facility] [place], [place] [id number] Rush Farmer MD [id number]   Ferritin     Status: None   Collection Time: 02/28/20  9:30 AM  Result Value Ref Range   Ferritin 12 11 - 307 ng/mL    Comment: Performed at [facility], [address] [place], [place] [address]  Uric acid     Status: Abnormal   Collection Time: 02/28/20  9:30 AM  Result Value Ref Range   Uric Acid, Serum 2.3 (L) 2.5 - 7.1 mg/dL    Comment: Performed at [facility], [address] [place], [place] [address]  CK     Status: None   Collection Time: 02/28/20  9:30 AM  Result Value Ref Range   Total CK 64 38 - 234 U/L    Comment: Performed at [facility], [address] [place], [place] [address]  C-reactive protein     Status: None   Collection Time: 02/28/20  9:30 AM  Result Value Ref Range   CRP <0.5 <1.0 mg/dL    Comment: Performed at [facility], [address], [place],  [address]  ANA     Status: None   Collection Time: 02/28/20  9:30 AM  Result Value Ref Range   Anti Nuclear Antibody (ANA) Negative Negative    Comment: (NOTE) Performed At: [facility] [place], [place] [id number] Rush Farmer MD [id number]    Rocky mtn spotted fvr abs pnl(IgG+IgM)     Status: None   Collection Time: 02/28/20  9:30 AM  Result Value Ref Range   RMSF IgG Negative Negative   RMSF IgM 0.24 0.00 - 0.89 index    Comment: (NOTE)                                 Negative        <0.90  Equivocal 0.90 - 1.10                                 Positive        >1.10 Performed At: [facility] [place], [place] [id number] Rush Farmer MD [id number]   Celiac Panel     Status: None   Collection Time: 02/28/20  9:30 AM  Result Value Ref Range   Antigliadin Abs, IgA 3 0 - 19 units    Comment: (NOTE)                   Negative                   0 - 19                   Weak Positive             20 - 30                   Moderate to Strong Positive   >30    Gliadin IgG 2 0 - 19 units    Comment: (NOTE)                   Negative                   0 - 19                   Weak Positive             20 - 30                   Moderate to Strong Positive   >30    Tissue Transglutaminase Ab, IgA <2 0 - 3 U/mL    Comment: (NOTE)                              Negative        0 -  3                              Weak Positive   4 - 10                              Positive           >10 Tissue Transglutaminase (tTG) has been identified as the endomysial antigen.  Studies have demonstr- ated that endomysial IgA antibodies have over 99% specificity for gluten sensitive enteropathy. Performed At: [facility] [place], [place] [id number] Rush Farmer MD [id number]   Sedimentation rate     Status: None   Collection Time: 02/28/20  9:30 AM  Result Value Ref Range   Sed Rate 7 0 - 20 mm/hr    Comment: Performed at [facility], [address] [place], [place] [address]  Comp Met (CMET)     Status: Abnormal   Collection Time: 02/28/20  9:30 AM  Result Value Ref Range   Sodium 134 (L) 135 - 145 mmol/L   Potassium 3.9 3.5 - 5.1 mmol/L    Chloride 104 98 - 111 mmol/L   CO2 23 22 - 32 mmol/L   Glucose, Bld 108 (  H) 70 - 99 mg/dL    Comment: Glucose reference range applies only to samples taken after fasting for at least 8 hours.   BUN 14 6 - 20 mg/dL   Creatinine, Ser 0.83 0.44 - 1.00 mg/dL   Calcium 8.8 (L) 8.9 - 10.3 mg/dL   Total Protein 7.3 6.5 - 8.1 g/dL   Albumin 4.2 3.5 - 5.0 g/dL   AST 23 15 - 41 U/L   ALT 19 0 - 44 U/L   Alkaline Phosphatase 37 (L) 38 - 126 U/L   Total Bilirubin 0.3 0.3 - 1.2 mg/dL   GFR calc non Af Amer >60 >60 mL/min   GFR calc Af Amer >60 >60 mL/min   Anion gap 7 5 - 15    Comment: Performed at [facility], [address] [place], [place] [address]   Objective  Body mass index is 26.75 kg/m. Wt Readings from Last 3 Encounters:  03/08/20 170 lb 12.8 oz (77.5 kg)  02/27/20 162 lb (73.5 kg)  12/08/19 162 lb (73.5 kg)   Temp Readings from Last 3 Encounters:  03/08/20 99.2 F (37.3 C) (Oral)  08/14/19 98.4 F (36.9 C) (Oral)  12/20/18 98.7 F (37.1 C) (Oral)   BP Readings from Last 3 Encounters:  03/08/20 136/86  02/27/20 129/77  12/08/19 (!) 152/110   Pulse Readings from Last 3 Encounters:  03/08/20 (!) 101  08/14/19 90  12/03/18 94    Physical Exam Vitals and nursing note reviewed.  Constitutional:      Appearance: Normal appearance. She is well-developed, well-groomed and overweight.  HENT:     Head: Normocephalic and atraumatic.  Eyes:     Conjunctiva/sclera: Conjunctivae normal.     Pupils: Pupils are equal, round, and reactive to light.  Cardiovascular:     Rate and Rhythm: Normal rate and regular rhythm.     Heart sounds: Normal heart sounds. No murmur.  Pulmonary:     Effort: Pulmonary effort is normal.     Breath sounds: Normal breath sounds.  Skin:    General: Skin is warm and dry.     Comments: Itchy papules to arms b/l red  Neurological:     General: No focal deficit present.     Mental Status: She is alert and oriented to person, place,  and time. Mental status is at baseline.     Gait: Gait normal.  Psychiatric:        Attention and Perception: Attention and perception normal.        Mood and Affect: Mood and affect normal.        Speech: Speech normal.        Behavior: Behavior normal. Behavior is cooperative.        Thought Content: Thought content normal.        Cognition and Memory: Cognition and memory normal.        Judgment: Judgment normal.     Assessment  Plan  Anxiety and depression Stress Will CC note to Dr. Harlene Ramus and rec therapy at that office and if not available will do here  Wants to know if mydayis causes rash  rec go to PT as PT or change jobs  Essential hypertension - Plan: spironolactone (ALDACTONE) 25 MG tablet 1/2 pill and increase to 25 mg if BP not at goal <130/<80   Acne vulgaris - Plan: spironolactone (ALDACTONE) 25 MG tablet  Rash F/u Dr. Evorn Gong  Hypothyroidism, unspecified type  F/u Dr. Gabriel Carina  HM  utd  flu not had in 2019 and utd Tdap 1/2 pfizier had   Referred2/18/2020 mammo repeat Negative, ordered mammo 2021  -prior mammogram 10/03/13 normal Referred DEXA   Never smoker Pap 04/16/17 neg pap neg HPVPFWs/p hysterectomy 11/04/18 with uterus cervix and b/l fallopian tubes removed  Right thyroid FNA 68/34/19 benign follicular cells, rare oncocytic metaplastic cells and histiocytes consistent with MNG (benign thyriod nodules) neg malignancy. Thyroid US 09/12/13 thyroiditis, solitary nodule right thyroid increased in size -f/u [facility] endocrine Dr. Jonn Shingles 10/28/19 1.819 Psych Dr. Diamantina Monks Dr. Evorn Gong  Rheum Dr. Meda Coffee Provider: Dr. Olivia Mackie McLean-Scocuzza-Internal Medicine

## 2020-03-12 ENCOUNTER — Telehealth: Payer: Self-pay | Admitting: Internal Medicine

## 2020-03-12 NOTE — Telephone Encounter (Signed)
-----   Message from Delorise Jackson, MD sent at 03/08/2020  9:37 AM EDT ----- 1. Fax note to Dr. Harlene Ramus psych in [place] -on cover needs response from psych as far as therapy 2. Fax note to Dr. Evorn Gong derm 3. Fax not to Dr. Gabriel Carina endocrine

## 2020-03-12 NOTE — Telephone Encounter (Signed)
All notes faxed with request for response on cover to Dr Starleen Arms office.

## 2020-03-16 ENCOUNTER — Other Ambulatory Visit: Payer: BC Managed Care – PPO

## 2020-03-24 ENCOUNTER — Encounter (INDEPENDENT_AMBULATORY_CARE_PROVIDER_SITE_OTHER): Payer: Self-pay

## 2020-03-26 DIAGNOSIS — M2550 Pain in unspecified joint: Secondary | ICD-10-CM | POA: Insufficient documentation

## 2020-03-26 DIAGNOSIS — Z1159 Encounter for screening for other viral diseases: Secondary | ICD-10-CM | POA: Insufficient documentation

## 2020-03-26 HISTORY — DX: Encounter for screening for other viral diseases: Z11.59

## 2020-03-26 HISTORY — DX: Pain in unspecified joint: M25.50

## 2020-03-26 LAB — HM HEPATITIS C SCREENING LAB: HM Hepatitis Screen: NEGATIVE

## 2020-03-26 LAB — HM HIV SCREENING LAB: HM HIV Screening: NEGATIVE

## 2020-03-27 ENCOUNTER — Ambulatory Visit: Payer: BC Managed Care – PPO

## 2020-03-30 ENCOUNTER — Ambulatory Visit: Payer: BC Managed Care – PPO | Attending: Internal Medicine

## 2020-03-30 DIAGNOSIS — Z23 Encounter for immunization: Secondary | ICD-10-CM

## 2020-03-30 NOTE — Progress Notes (Signed)
   Covid-19 Vaccination Clinic  Name:  Cynthia Burke    MRN: [id number] DOB: [birth year]  03/30/2020  [name] was observed post Covid-19 immunization for 15 minutes without incident. She was provided with Vaccine Information Sheet and instruction to access the V-Safe system.   [name] was instructed to call 911 with any severe reactions post vaccine: Marland Kitchen Difficulty breathing  . Swelling of face and throat  . A fast heartbeat  . A bad rash all over body  . Dizziness and weakness   Immunizations Administered    Name Date Dose VIS Date Route   Pfizer COVID-19 Vaccine 03/30/2020  8:47 AM 0.3 mL 01/04/2019 Intramuscular   Manufacturer: [place]   Lot: [id number]   [place]: [id number]

## 2020-04-12 ENCOUNTER — Encounter: Payer: Self-pay | Admitting: Internal Medicine

## 2020-04-12 DIAGNOSIS — M8580 Other specified disorders of bone density and structure, unspecified site: Secondary | ICD-10-CM | POA: Insufficient documentation

## 2020-04-13 ENCOUNTER — Other Ambulatory Visit: Payer: Self-pay | Admitting: Internal Medicine

## 2020-04-13 DIAGNOSIS — L700 Acne vulgaris: Secondary | ICD-10-CM

## 2020-04-13 DIAGNOSIS — I10 Essential (primary) hypertension: Secondary | ICD-10-CM

## 2020-04-13 MED ORDER — SPIRONOLACTONE 25 MG PO TABS: 12.5000 mg | ORAL_TABLET | Freq: Every day | ORAL | 3 refills | Status: DC

## 2020-05-17 ENCOUNTER — Encounter: Payer: Self-pay | Admitting: Internal Medicine

## 2020-06-12 ENCOUNTER — Encounter: Payer: Self-pay | Admitting: Internal Medicine

## 2020-06-15 ENCOUNTER — Encounter: Payer: Self-pay | Admitting: Internal Medicine

## 2020-07-04 ENCOUNTER — Telehealth: Payer: BC Managed Care – PPO | Admitting: Family

## 2020-07-04 DIAGNOSIS — J4520 Mild intermittent asthma, uncomplicated: Secondary | ICD-10-CM | POA: Diagnosis not present

## 2020-07-04 DIAGNOSIS — J019 Acute sinusitis, unspecified: Secondary | ICD-10-CM | POA: Diagnosis not present

## 2020-07-04 MED ORDER — AMOXICILLIN-POT CLAVULANATE 875-125 MG PO TABS: 1.0000 | ORAL_TABLET | Freq: Two times a day (BID) | ORAL | 0 refills | Status: DC

## 2020-07-04 NOTE — Progress Notes (Signed)

## 2020-07-05 ENCOUNTER — Encounter: Payer: BC Managed Care – PPO | Admitting: Nurse Practitioner

## 2020-07-05 ENCOUNTER — Other Ambulatory Visit: Payer: Self-pay

## 2020-07-05 ENCOUNTER — Encounter: Payer: Self-pay | Admitting: Nurse Practitioner

## 2020-07-09 NOTE — Progress Notes (Signed)
Erroneous- canceled visit

## 2020-07-17 ENCOUNTER — Ambulatory Visit: Payer: BC Managed Care – PPO | Admitting: Internal Medicine

## 2020-07-30 ENCOUNTER — Ambulatory Visit
Admission: RE | Admit: 2020-07-30 | Discharge: 2020-07-30 | Disposition: A | Discharge status: Home / Self Care | Payer: BC Managed Care – PPO | Source: Ambulatory Visit | Attending: Internal Medicine | Admitting: Internal Medicine

## 2020-07-30 ENCOUNTER — Other Ambulatory Visit: Payer: Self-pay

## 2020-07-30 DIAGNOSIS — Z1231 Encounter for screening mammogram for malignant neoplasm of breast: Secondary | ICD-10-CM | POA: Diagnosis not present

## 2020-10-18 ENCOUNTER — Other Ambulatory Visit: Payer: Self-pay

## 2020-10-18 DIAGNOSIS — L700 Acne vulgaris: Secondary | ICD-10-CM

## 2020-10-18 DIAGNOSIS — I10 Essential (primary) hypertension: Secondary | ICD-10-CM

## 2020-10-18 MED ORDER — SPIRONOLACTONE 25 MG PO TABS: 12.5000 mg | ORAL_TABLET | Freq: Every day | ORAL | 3 refills | Status: DC

## 2020-11-13 ENCOUNTER — Other Ambulatory Visit: Payer: Self-pay

## 2020-11-13 ENCOUNTER — Encounter: Payer: Self-pay | Admitting: Neurology

## 2020-11-13 ENCOUNTER — Ambulatory Visit (INDEPENDENT_AMBULATORY_CARE_PROVIDER_SITE_OTHER): Payer: BC Managed Care – PPO | Admitting: Neurology

## 2020-11-13 VITALS — BP 128/85 | HR 97 | Ht 67.0 in | Wt 174.0 lb

## 2020-11-13 DIAGNOSIS — E663 Overweight: Secondary | ICD-10-CM | POA: Diagnosis not present

## 2020-11-13 DIAGNOSIS — R351 Nocturia: Secondary | ICD-10-CM

## 2020-11-13 DIAGNOSIS — G478 Other sleep disorders: Secondary | ICD-10-CM | POA: Diagnosis not present

## 2020-11-13 DIAGNOSIS — R0683 Snoring: Secondary | ICD-10-CM | POA: Diagnosis not present

## 2020-11-13 DIAGNOSIS — Z820 Family history of epilepsy and other diseases of the nervous system: Secondary | ICD-10-CM | POA: Diagnosis not present

## 2020-11-13 DIAGNOSIS — R519 Headache, unspecified: Secondary | ICD-10-CM

## 2020-11-13 DIAGNOSIS — R635 Abnormal weight gain: Secondary | ICD-10-CM

## 2020-11-13 NOTE — Patient Instructions (Signed)

## 2020-11-13 NOTE — Progress Notes (Signed)
Subjective:    Patient ID: Cynthia Burke is a 45 y.o. female.  HPI     Star Age, MD, PhD [facility] [address], [address] [place], [place] [address]  Dear Ginny Forth,   I saw your patient, Cynthia Burke, upon your kind request, in my sleep clinic today for initial consultation on her sleep disorder, in particular, concern for underlying obstructive sleep apnea.  The patient is unaccompanied today.  As you know, Cynthia Burke is a 45 year old right-handed woman with an underlying medical history of allergies, anemia, premature ovarian failure, fibroids, asthma, ADD, depression, hyperlipidemia, thyroid disease with history of Hashimoto's thyroiditis, seizure in the past (x 2, deemed secondary to Wellbutrin), and overweight state, who reports chronic difficulty with sleep initiation and sleep maintenance, nonrestorative sleep.  She snores.  She has had weight gain in the realm of 20 to 25 pounds in the past couple of years.  Both parents have sleep apnea, mom has a CPAP machine, dad has a BiPAP machine.  She has had rare morning headaches and has nocturia about once or twice per average night, sometimes even more.  She is married and lives with her husband.  They have no children.  They have several pets in the household, 4 cats and 1 dog, the dog typically sleeps on the bed and sometimes 1 or 2 cats will sleep on the bed as well but not consistently.  She tries to be in bed around 10 and rise time is generally between 6 and 7 AM.  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  She is a non-smoker and drinks alcohol rarely, caffeine in the form of tea and caffeinated water, 300 to 400 mg daily. I reviewed your office records.  Her Epworth sleepiness score is 7 out of 24, fatigue severity score is 56 out of 63.  She denies any telltale symptoms of restless leg syndrome.  She was diagnosed at age 34 with asthma.   She has a rescue inhaler at this time.  She is currently on Prozac 40 mg daily, takes allergy medication and is on thyroid medication.  Her Past Medical History Is Significant For: Past Medical History:  Diagnosis Date  . ADD (attention deficit disorder)   . Allergy   . Anemia   . Asthma   . Depression   . Hashimoto's disease   . Hashimoto's thyroiditis   . History of chicken pox   . History of UTI   . Hyperlipidemia   . Premature ovarian insufficiency    [facility] 29 Dr. Hughie Closs   . Seizures ([place])    medication induced wellbutrin    Her Past Surgical History Is Significant For: Past Surgical History:  Procedure Laterality Date  . ABDOMINAL HYSTERECTOMY     Dr. Gerarda Fraction 11-04-18  . DILATION AND CURETTAGE OF UTERUS     2015 and 2018   . HYSTERECTOMY ABDOMINAL WITH SALPINGECTOMY Bilateral 11/04/2018   Procedure: HYSTERECTOMY ABDOMINAL WITH BILATERAL SALPINGECTOMY, Exploratory laparotomy;  Surgeon: Isabel Caprice, MD;  Location: [facility];  Service: Gynecology;  Laterality: Bilateral;  . MYOMECTOMY     2015     Her Family History Is Significant For: Family History  Problem Relation Age of Onset  . Rheum arthritis Mother   . Hashimoto's thyroiditis Mother   . Hypertension Mother   . Hyperlipidemia Mother   . Arthritis Mother   . Asthma Mother   . Depression Mother   .  Kidney disease Mother   . Miscarriages / [place] Mother   . Diabetes Mellitus II Father   . Hypothyroidism Father   . Hypertension Father   . Coronary artery disease Father   . Hyperlipidemia Father   . Arthritis Father   . COPD Father   . Depression Father   . Diabetes Father   . Heart disease Father   . Kidney disease Father   . Bladder Cancer Father   . Prostate cancer Father   . Stroke Maternal Grandmother   . Heart disease Maternal Grandmother   . Breast cancer Paternal Grandmother 18  . Early death Paternal Grandmother   . Cancer Paternal Grandmother        breast died   . Miscarriages /  Stillbirths Paternal Grandmother   . Learning disabilities Brother   . Hypertension Brother   . Depression Brother   . Alcohol abuse Maternal Grandfather   . Early death Maternal Grandfather   . Heart disease Paternal Grandfather     Her Social History Is Significant For: Social History   Socioeconomic History  . Marital status: Married    Spouse name: Not on file  . Number of children: Not on file  . Years of education: Not on file  . Highest education level: Not on file  Occupational History  . Not on file  Tobacco Use  . Smoking status: Never Smoker  . Smokeless tobacco: Never Used  Vaping Use  . Vaping Use: Never used  Substance and Sexual Activity  . Alcohol use: No  . Drug use: No  . Sexual activity: Yes    Birth control/protection: None  Other Topics Concern  . Not on file  Social History Narrative   Married    PT   No kids    Social Determinants of Health   Financial Resource Strain: Not on file  Food Insecurity: Not on file  Transportation Needs: Not on file  Physical Activity: Not on file  Stress: Not on file  Social Connections: Not on file    Her Allergies Are:  Allergies  Allergen Reactions  . Norvasc [Amlodipine]     Swelling   . Wellbutrin [Bupropion] Other (See Comments)    Seizures    . Latex Other (See Comments)    Itching, sneezing  . Other Other (See Comments)    Tree pollen    :   Her Current Medications Are:  Outpatient Encounter Medications as of 11/13/2020  Medication Sig  . albuterol (VENTOLIN HFA) 108 (90 Base) MCG/ACT inhaler Inhale 1-2 puffs into the lungs every 6 (six) hours as needed for wheezing or shortness of breath.  . Amphet-Dextroamphet 3-Bead ER (MYDAYIS) 50 MG CP24 Take 50 mg by mouth daily. Maydais  . Calcium Carbonate-Vit D-Min (CALTRATE 600+D PLUS PO) Take 1 capsule by mouth daily.  . Cetirizine HCl (ZYRTEC PO) Take by mouth.  . Cholecalciferol (VITAMIN D3) 25 MCG (1000 UT) CAPS Take 2,000 Units by mouth  daily.  Marland Kitchen estradiol (ESTRACE) 1 MG tablet Take 1 mg by mouth daily.  Marland Kitchen FLUoxetine (PROZAC) 40 MG capsule Take 40 mg by mouth daily.   . Magnesium 250 MG TABS Take 1 tablet by mouth daily.  . montelukast (SINGULAIR) 10 MG tablet Take 1 tablet (10 mg total) by mouth at bedtime.  Marland Kitchen thyroid (ARMOUR) 90 MG tablet Take 90 mg by mouth daily. Patient takes 90 mg 6 days 45 mg on Sunday.  . [DISCONTINUED] amoxicillin-clavulanate (AUGMENTIN) 875-125 MG tablet Take 1  tablet by mouth 2 (two) times daily.  . [DISCONTINUED] azelastine (ASTELIN) 0.1 % nasal spray Place 2 sprays into both nostrils 2 (two) times daily as needed for allergies.   . [DISCONTINUED] Coenzyme Q10 (COQ10) 100 MG CAPS Take 900 mg by mouth 2 (two) times daily.   . [DISCONTINUED] diphenhydrAMINE HCl (BENADRYL ALLERGY PO) Take by mouth.  . [DISCONTINUED] Multiple Vitamins-Minerals (MULTIVITAMIN ADULT) CHEW Chew 2 each by mouth daily.  . [DISCONTINUED] spironolactone (ALDACTONE) 25 MG tablet Take 0.5-1 tablets (12.5-25 mg total) by mouth daily. If BP <130<80 increase to 1 pill daily in am  . [DISCONTINUED] UNABLE TO FIND Med Name: estrogen patch 0.5 2x per week   No facility-administered encounter medications on file as of 11/13/2020.  :  Review of Systems:  Out of a complete 14 point review of systems, all are reviewed and negative with the exception of these symptoms as listed below: Review of Systems  Neurological:       Pt presents today to discuss her sleep. Pt has never had a sleep study but does endorse snoring.  Epworth Sleepiness Scale 0= would never doze 1= slight chance of dozing 2= moderate chance of dozing 3= high chance of dozing  Sitting and reading: 2 Watching TV: 1 Sitting inactive in a public place (ex. Theater or meeting): 0 As a passenger in a car for an hour without a break: 0 Lying down to rest in the afternoon: 2 Sitting and talking to someone: 0 Sitting quietly after lunch (no alcohol): 2 In a car, while  stopped in traffic: 0 Total: 7     Objective:  Neurological Exam  Physical Exam Physical Examination:   Vitals:   11/13/20 1033  BP: 128/85  Pulse: 97    General Examination: The patient is a very pleasant 45 y.o. female in no acute distress. She appears well-developed and well-nourished and well groomed.  Somewhat fidgety appearing.  Pleasant and conversant.  HEENT: Normocephalic, atraumatic, pupils are equal, round and reactive to light, extraocular tracking is good without limitation to gaze excursion or nystagmus noted. Hearing is grossly intact. Face is symmetric with normal facial animation. Speech is clear with no dysarthria noted. There is no hypophonia. There is no lip, neck/head, jaw or voice tremor. Neck is supple with full range of passive and active motion. There are no carotid bruits on auscultation. Oropharynx exam reveals: mild mouth dryness, good dental hygiene and mild airway crowding, due to small airway entry, redundant soft palate, tonsils on the smaller side, Mallampati class II, neck circumference of 13 three-quarter inches.  She has a mild to moderate overbite.  Tongue protrudes centrally and palate elevates symmetrically.  Chest: Clear to auscultation without wheezing, rhonchi or crackles noted.  Heart: S1+S2+0, regular and normal without murmurs, rubs or gallops noted.   Abdomen: Soft, non-tender and non-distended with normal bowel sounds appreciated on auscultation.  Extremities: There is no pitting edema in the distal lower extremities bilaterally.   Skin: Warm and dry without trophic changes noted.   Musculoskeletal: exam reveals no obvious joint deformities, tenderness or joint swelling or erythema.   Neurologically:  Mental status: The patient is awake, alert and oriented in all 4 spheres. Her immediate and remote memory, attention, language skills and fund of knowledge are appropriate. There is no evidence of aphasia, agnosia, apraxia or anomia.  Speech is clear with normal prosody and enunciation. Thought process is linear. Mood is normal and affect is normal.  Cranial nerves II - XII are  as described above under HEENT exam.  Motor exam: Normal bulk, strength and tone is noted. There is no tremor, Romberg is negative. Reflexes are 2+ throughout. Fine motor skills and coordination: grossly intact.  Cerebellar testing: No dysmetria or intention tremor. There is no truncal or gait ataxia.  Sensory exam: intact to light touch in the upper and lower extremities.  Gait, station and balance: She stands easily. No veering to one side is noted. No leaning to one side is noted. Posture is age-appropriate and stance is narrow based. Gait shows normal stride length and normal pace. No problems turning are noted. Tandem walk is unremarkable.                Assessment and Plan:  In summary, Cynthia Burke is a very pleasant 45 y.o.-year old female with an underlying medical history of allergies, anemia, premature ovarian failure, fibroids, asthma, ADD, depression, hyperlipidemia, thyroid disease with history of Hashimoto's thyroiditis, seizure in the past (x 2, deemed secondary to Wellbutrin), and overweight state, whose history and physical exam are concerning for obstructive sleep apnea (OSA). I had a long chat with the patient about my findings and the diagnosis of OSA, its prognosis and treatment options. We talked about medical treatments, surgical interventions and non-pharmacological approaches. I explained in particular the risks and ramifications of untreated moderate to severe OSA, especially with respect to developing cardiovascular disease down the Road, including congestive heart failure, difficult to treat hypertension, cardiac arrhythmias, or stroke. Even type 2 diabetes has, in part, been linked to untreated OSA. Symptoms of untreated OSA include daytime sleepiness, memory problems, mood irritability and mood disorder such as depression  and anxiety, lack of energy, as well as recurrent headaches, especially morning headaches. We talked about trying to maintain a healthy lifestyle in general, as well as the importance of weight control. We also talked about the importance of good sleep hygiene. I recommended the following at this time: sleep study.   I explained the sleep test procedure to the patient and also outlined possible surgical and non-surgical treatment options of OSA, including the use of a custom-made dental device (which would require a referral to a specialist dentist or oral surgeon), upper airway surgical options, such as traditional UPPP or a novel less invasive surgical option in the form of Inspire hypoglossal nerve stimulation (which would involve a referral to an ENT surgeon). I also explained the CPAP treatment option to the patient, who indicated that she would be willing to try PAP therapy, in the form of CPAP or autoPAP, if the need arises. I explained the importance of being compliant with PAP treatment, not only for insurance purposes but primarily to improve Her symptoms, and for the patient's long term health benefit, including to reduce Her cardiovascular risks. I answered all her questions today and the patient was in agreement. I plan to see her back after the sleep study is completed and encouraged her to call with any interim questions, concerns, problems or updates.   Thank you very much for allowing me to participate in the care of this nice patient. If I can be of any further assistance to you please do not hesitate to call me at [phone].  Sincerely,   Star Age, MD, PhD

## 2020-11-16 ENCOUNTER — Other Ambulatory Visit: Payer: Self-pay

## 2020-11-16 DIAGNOSIS — J309 Allergic rhinitis, unspecified: Secondary | ICD-10-CM

## 2020-11-16 MED ORDER — MONTELUKAST SODIUM 10 MG PO TABS: 10.0000 mg | ORAL_TABLET | Freq: Every day | ORAL | 1 refills | Status: DC

## 2020-11-22 ENCOUNTER — Telehealth: Payer: Self-pay

## 2020-11-22 NOTE — Telephone Encounter (Signed)
Called to schedule sleep study.

## 2020-12-03 ENCOUNTER — Telehealth: Payer: Self-pay

## 2020-12-03 NOTE — Telephone Encounter (Signed)
LVM for pt to call me back to schedule sleep study  

## 2020-12-05 ENCOUNTER — Telehealth: Payer: Self-pay

## 2020-12-05 NOTE — Telephone Encounter (Signed)
We have attempted to call the patient two times to schedule sleep study.  Patient has been unavailable at the phone numbers we have on file and has not returned our calls. If patient calls back we will schedule them for their sleep study.  

## 2021-05-26 ENCOUNTER — Other Ambulatory Visit: Payer: Self-pay | Admitting: Internal Medicine

## 2021-05-26 DIAGNOSIS — J309 Allergic rhinitis, unspecified: Secondary | ICD-10-CM

## 2021-07-03 ENCOUNTER — Encounter: Payer: BC Managed Care – PPO | Admitting: Family

## 2021-07-10 ENCOUNTER — Encounter: Payer: BC Managed Care – PPO | Admitting: Family

## 2021-09-24 ENCOUNTER — Encounter: Payer: Self-pay | Admitting: Adult Health

## 2021-09-24 ENCOUNTER — Telehealth (INDEPENDENT_AMBULATORY_CARE_PROVIDER_SITE_OTHER): Payer: BC Managed Care – PPO | Admitting: Adult Health

## 2021-09-24 VITALS — BP 140/100 | Ht 67.01 in | Wt 175.0 lb

## 2021-09-24 DIAGNOSIS — E039 Hypothyroidism, unspecified: Secondary | ICD-10-CM | POA: Diagnosis not present

## 2021-09-24 DIAGNOSIS — I10 Essential (primary) hypertension: Secondary | ICD-10-CM

## 2021-09-24 DIAGNOSIS — L700 Acne vulgaris: Secondary | ICD-10-CM

## 2021-09-24 MED ORDER — SPIRONOLACTONE 25 MG PO TABS: 25.0000 mg | ORAL_TABLET | Freq: Every day | ORAL | 0 refills | Status: DC

## 2021-09-24 NOTE — Progress Notes (Signed)
Virtual Visit via Video Note  I connected with Cynthia Burke on 09/26/21 at  4:00 PM EST by a video enabled telemedicine application and verified that I am speaking with the correct person using two identifiers.  Location: Patient: at home  Provider: Provider: Provider's office at  [facility], [place].   I discussed the limitations of evaluation and management by telemedicine and the availability of in person appointments. The patient expressed understanding and agreed to proceed.  I discussed the assessment and treatment plan with the patient. The patient was provided an opportunity to ask questions and all were answered. The patient agreed with the plan and demonstrated an understanding of the instructions.   The patient was advised to call back or seek an in-person evaluation if the symptoms worsen or if the condition fails to improve as anticipated.  I provided 23 minutes of non-face-to-face time during this encounter.   Marcille Buffy, FNP   Subjective:    Patient ID: Cynthia Burke, female    DOB: 28-Jul-1976, 45 y.o.   MRN: [id number]  Chief Complaint  Patient presents with   Stress   Hypertension    Pt states Bp has been in the range of 160's/140's over 100     She is care giving for her mother, her dad had to go to the rehab center. 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 Feels stress up. She has been taking Spirolactone intermittently for the past 2 months. She is taking Prozac every day 40mg  daily. Sees psychiatry was going to to call in Abilify but not covered by insurance- she did not want to take with insurance.   Anxiety Presents for follow-up (sees Dr. Theresia Majors for psychiatry virtually, on prozac, was given counselor) visit. Symptoms include decreased concentration, excessive worry, nervous/anxious behavior and restlessness. Patient reports no chest pain, compulsions, confusion, depressed mood, dizziness, dry  mouth, feeling of choking, hyperventilation, impotence, insomnia, irritability, malaise, muscle tension, nausea, obsessions, palpitations, panic, shortness of breath or suicidal ideas. Symptoms occur most days. The severity of symptoms is interfering with daily activities and moderate. The quality of sleep is poor. Nighttime awakenings: several.   Compliance with medications is 76-100%.  Hypertension This is a chronic (for last year hs been elevated at times.) problem. Episode onset: she was trying to go without spiralactone 12.5 mg but today she took it. The problem is uncontrolled. Associated symptoms include anxiety and peripheral edema (mild swelling in lower legs for 10 years since " hashimotos" wears compression socks.). Pertinent negatives include no blurred vision, chest pain, headaches, malaise/fatigue, neck pain, orthopnea, palpitations, PND, shortness of breath or sweats. Associated agents: estrace. Risk factors for coronary artery disease include dyslipidemia and obesity. Past treatments include calcium channel blockers and diuretics (stopped due to leg swelling.). The current treatment provides no improvement. Compliance problems include exercise and diet.  There is no history of angina, kidney disease, CAD/MI, CVA, heart failure, left ventricular hypertrophy, PVD or retinopathy. Identifiable causes of hypertension include a thyroid problem.   She is taking Thyroid 90 mg 6 days a week, sees Dr. Gabriel Carina for endocrinology, she has an appointment and she says she is thinking about canceling due to not wanting to get on scales.  Last TSH was elevated - 03/2020  Denies any suicidal or homicidal ideations or intents.   Patient  denies any fever, body aches,chills, rash, chest pain, shortness of breath, nausea, vomiting, or diarrhea.    Past  Medical History:  Diagnosis Date   ADD (attention deficit disorder)    Allergy    Anemia    Asthma    Depression    Hashimoto's disease    Hashimoto's  thyroiditis    History of chicken pox    History of UTI    Hyperlipidemia    Premature ovarian insufficiency    [facility] 29 Dr. Hughie Closs    Seizures [facility])    medication induced wellbutrin    Past Surgical History:  Procedure Laterality Date   ABDOMINAL HYSTERECTOMY     Dr. Gerarda Fraction 11-04-18   [place] OF UTERUS     2015 and 2018    HYSTERECTOMY ABDOMINAL WITH SALPINGECTOMY Bilateral 11/04/2018   Procedure: HYSTERECTOMY ABDOMINAL WITH BILATERAL SALPINGECTOMY, Exploratory laparotomy;  Surgeon: Isabel Caprice, MD;  Location: [facility];  Service: Gynecology;  Laterality: Bilateral;   MYOMECTOMY     2015     Family History  Problem Relation Age of Onset   Rheum arthritis Mother    Hashimoto's thyroiditis Mother    Hypertension Mother    Hyperlipidemia Mother    Arthritis Mother    Asthma Mother    Depression Mother    Kidney disease Mother    78 / [place] Mother    Diabetes Mellitus II Father    Hypothyroidism Father    Hypertension Father    Coronary artery disease Father    Hyperlipidemia Father    Arthritis Father    COPD Father    Depression Father    Diabetes Father    Heart disease Father    Kidney disease Father    Bladder Cancer Father    Prostate cancer Father    Stroke Maternal Grandmother    Heart disease Maternal Grandmother    Breast cancer Paternal Grandmother 59   Early death Paternal Grandmother    Cancer Paternal Grandmother        breast died    40 / Stillbirths Paternal Grandmother    Learning disabilities Brother    Hypertension Brother    Depression Brother    Alcohol abuse Maternal Grandfather    Early death Maternal Grandfather    Heart disease Paternal Grandfather     Social History   Socioeconomic History   Marital status: Married    Spouse name: Not on file   Number of children: Not on file   Years of education: Not on file   Highest education level: Not on file  Occupational History   Not on  file  Tobacco Use   Smoking status: Never   Smokeless tobacco: Never  Vaping Use   Vaping Use: Never used  Substance and Sexual Activity   Alcohol use: No   Drug use: No   Sexual activity: Yes    Birth control/protection: None  Other Topics Concern   Not on file  Social History Narrative   Married    PT   No kids    Social Determinants of Health   Financial Resource Strain: Not on file  Food Insecurity: Not on file  Transportation Needs: Not on file  Physical Activity: Not on file  Stress: Not on file  Social Connections: Not on file  Intimate Partner Violence: Not on file    Outpatient Medications Prior to Visit  Medication Sig Dispense Refill   Amphet-Dextroamphet 3-Bead ER (MYDAYIS) 50 MG CP24 Take 50 mg by mouth daily. [place]     Cetirizine HCl (ZYRTEC PO) Take by mouth.  estradiol (ESTRACE) 1 MG tablet Take 1 mg by mouth daily.     FLUoxetine (PROZAC) 40 MG capsule Take 40 mg by mouth daily.      thyroid (ARMOUR) 90 MG tablet Take 90 mg by mouth daily. Patient takes 90 mg 6 days 45 mg on Sunday.     albuterol (VENTOLIN HFA) 108 (90 Base) MCG/ACT inhaler Inhale 1-2 puffs into the lungs every 6 (six) hours as needed for wheezing or shortness of breath. 1 g 12   Calcium Carbonate-Vit D-Min (CALTRATE 600+D PLUS PO) Take 1 capsule by mouth daily. (Patient not taking: Reported on 09/24/2021)     Cholecalciferol (VITAMIN D3) 25 MCG (1000 UT) CAPS Take 2,000 Units by mouth daily. (Patient not taking: Reported on 09/24/2021)     Magnesium 250 MG TABS Take 1 tablet by mouth daily. (Patient not taking: Reported on 09/24/2021)     montelukast (SINGULAIR) 10 MG tablet TAKE 1 TABLET BY MOUTH EVERYDAY AT BEDTIME (Patient not taking: Reported on 09/24/2021) 90 tablet 1   No facility-administered medications prior to visit.    Allergies  Allergen Reactions   Amlodipine     Swelling  Other reaction(s): Other (See Comments) Swelling Swelling   Bupropion Other (See Comments)     Seizures   Other reaction(s): Other (See Comments), Other (See Comments), Other (See Comments) Seizures  seizure Seizures    Latex Other (See Comments) and Hives    Itching, sneezing Other reaction(s): Other (See Comments) Itching, sneezing Itching, sneezing Itching, sneezing  Itching, sneezing   Other Other (See Comments)    Tree pollen   Other reaction(s): Other (See Comments), Other (See Comments) Tree pollen  unknown Tree pollen     Review of Systems  Constitutional:  Positive for activity change and fatigue. Negative for appetite change, chills, diaphoresis, fever, irritability, malaise/fatigue and unexpected weight change.  HENT: Negative.    Eyes:  Negative for blurred vision.  Respiratory: Negative.  Negative for shortness of breath.   Cardiovascular: Negative.  Negative for chest pain, palpitations, orthopnea and PND.  Gastrointestinal: Negative.  Negative for nausea.  Endocrine: Negative for polydipsia, polyphagia and polyuria.  Genitourinary: Negative.  Negative for impotence.  Musculoskeletal:  Negative for neck pain.  Skin: Negative.   Neurological: Negative.  Negative for dizziness and headaches.  Psychiatric/Behavioral:  Positive for decreased concentration. Negative for agitation, behavioral problems, confusion, dysphoric mood, hallucinations, self-injury, sleep disturbance and suicidal ideas. The patient is nervous/anxious. The patient does not have insomnia and is not hyperactive.       Objective:    Physical Exam   Patient is alert and oriented and responsive to questions Engages in conversation with provider. Speaks in full sentences without any pauses without any shortness of breath or distress.    No other vital signs available for video visit.   BP (!) 140/100 Comment: am  Ht 5' 7.01" (1.702 m)   Wt 175 lb (79.4 kg)   LMP 09/10/2018   BMI 27.40 kg/m  Wt Readings from Last 3 Encounters:  09/24/21 175 lb (79.4 kg)  11/13/20 174 lb (78.9  kg)  03/08/20 170 lb 12.8 oz (77.5 kg)    Health Maintenance Due  Topic Date Due   Pneumococcal Vaccine 6-33 Years old (1 - PCV) Never done   HIV Screening  Never done   Hepatitis C Screening  Never done   PAP SMEAR-Modifier  04/16/2020   COVID-19 Vaccine (3 - Booster for Pfizer series) 05/25/2020   INFLUENZA VACCINE  06/10/2021    There are no preventive care reminders to display for this patient.   Lab Results  Component Value Date   TSH 0.80 09/07/2018   Lab Results  Component Value Date   WBC 8.1 11/06/2018   HGB 8.9 (L) 11/06/2018   HCT 28.3 (L) 11/06/2018   MCV 96.3 11/06/2018   PLT 270 11/06/2018   Lab Results  Component Value Date   NA 134 (L) 02/28/2020   K 3.9 02/28/2020   CO2 23 02/28/2020   GLUCOSE 108 (H) 02/28/2020   BUN 14 02/28/2020   CREATININE 0.83 02/28/2020   BILITOT 0.3 02/28/2020   ALKPHOS 37 (L) 02/28/2020   AST 23 02/28/2020   ALT 19 02/28/2020   PROT 7.3 02/28/2020   ALBUMIN 4.2 02/28/2020   CALCIUM 8.8 (L) 02/28/2020   ANIONGAP 7 02/28/2020   GFR 75.43 09/07/2018   Lab Results  Component Value Date   CHOL 202 (H) 09/07/2018   Lab Results  Component Value Date   HDL 50.60 09/07/2018   Lab Results  Component Value Date   LDLCALC 132 (H) 09/07/2018   Lab Results  Component Value Date   TRIG 96.0 09/07/2018   Lab Results  Component Value Date   CHOLHDL 4 09/07/2018   No results found for: HGBA1C     Assessment & Plan:   Problem List Items Addressed This Visit       Cardiovascular and Mediastinum   Essential hypertension - Primary   Relevant Medications   spironolactone (ALDACTONE) 25 MG tablet   Other Relevant Orders   CBC with Differential/Platelet   Comprehensive metabolic panel     Endocrine   Hypothyroidism   Relevant Orders   TSH    She has been taking her spirolactone very intermittently she reports for the past 2 months previously she was not taking at all. She was taking 12.5 mg when taking.  Have  asked her to start her Spirolactone 25mg  once daily.  Overdue  for thyroid labs - hypothyroidism. She will return for labs in the next 1-2 days. Keep log of her blood pressure report if over 130/80 or if any hypotension.  Meds ordered this encounter  Medications   spironolactone (ALDACTONE) 25 MG tablet    Sig: Take 1 tablet (25 mg total) by mouth daily. If BP <130<80 increase to 1 pill daily in am    Dispense:  30 tablet    Refill:  0   Anxiety is not controlled, she recently had visit with her psychiatrist and she had been prescribed Abilify she is unsure if it is at the pharmacy or if her insurance covers. I have advised her to call her psychologist today since she is followed closely by psychiatry. She is also on MYDAYIS  for ADHD. She agreed to this plan. She also is advised she needs to have counseling she reports her psychologist referred her  to counselor. Denies any suicidal or homicidal ideations.   Advised to have blood pressure visit in office in one week.  Advised to keep endocrinology appointment for follow up on hypothyroidism.  Patient verbalized understanding of all instructions given and denies any further questions at this time.   Return in about 1 week (around 10/01/2021), or if symptoms worsen or fail to improve, for Go to Emergency room/ urgent care if worse, at any time for any worsening symptoms.     Marcille Buffy, FNP

## 2021-09-26 NOTE — Patient Instructions (Signed)
Hypertension, Adult High blood pressure (hypertension) is when the force of blood pumping through the arteries is too strong. The arteries are the blood vessels that carry blood from the heart throughout the body. Hypertension forces the heart to work harder to pump blood and may cause arteries to become narrow or stiff. Untreated or uncontrolled hypertension can cause a heart attack, heart failure, a stroke, kidney disease, and other problems. A blood pressure reading consists of a higher number over a lower number. Ideally, your blood pressure should be below 120/80. The first ("top") number is called the systolic pressure. It is a measure of the pressure in your arteries as your heart beats. The second ("bottom") number is called the diastolic pressure. It is a measure of the pressure in your arteries as the heart relaxes. What are the causes? The exact cause of this condition is not known. There are some conditions that result in or are related to high blood pressure. What increases the risk? Some risk factors for high blood pressure are under your control. The following factors may make you more likely to develop this condition: Smoking. Having type 2 diabetes mellitus, high cholesterol, or both. Not getting enough exercise or physical activity. Being overweight. Having too much fat, sugar, calories, or salt (sodium) in your diet. Drinking too much alcohol. Some risk factors for high blood pressure may be difficult or impossible to change. Some of these factors include: Having chronic kidney disease. Having a family history of high blood pressure. Age. Risk increases with age. Race. You may be at higher risk if you are African American. Gender. Men are at higher risk than women before age 52. After age 62, women are at higher risk than men. Having obstructive sleep apnea. Stress. What are the signs or symptoms? High blood pressure may not cause symptoms. Very high blood pressure  (hypertensive crisis) may cause: Headache. Anxiety. Shortness of breath. Nosebleed. Nausea and vomiting. Vision changes. Severe chest pain. Seizures. How is this diagnosed? This condition is diagnosed by measuring your blood pressure while you are seated, with your arm resting on a flat surface, your legs uncrossed, and your feet flat on the floor. The cuff of the blood pressure monitor will be placed directly against the skin of your upper arm at the level of your heart. It should be measured at least twice using the same arm. Certain conditions can cause a difference in blood pressure between your right and left arms. Certain factors can cause blood pressure readings to be lower or higher than normal for a short period of time: When your blood pressure is higher when you are in a health care provider's office than when you are at home, this is called white coat hypertension. Most people with this condition do not need medicines. When your blood pressure is higher at home than when you are in a health care provider's office, this is called masked hypertension. Most people with this condition may need medicines to control blood pressure. If you have a high blood pressure reading during one visit or you have normal blood pressure with other risk factors, you may be asked to: Return on a different day to have your blood pressure checked again. Monitor your blood pressure at home for 1 week or longer. If you are diagnosed with hypertension, you may have other blood or imaging tests to help your health care provider understand your overall risk for other conditions. How is this treated? This condition is treated by making  healthy lifestyle changes, such as eating healthy foods, exercising more, and reducing your alcohol intake. Your health care provider may prescribe medicine if lifestyle changes are not enough to get your blood pressure under control, and if: Your systolic blood pressure is above  130. Your diastolic blood pressure is above 80. Your personal target blood pressure may vary depending on your medical conditions, your age, and other factors. Follow these instructions at home: Eating and drinking  Eat a diet that is high in fiber and potassium, and low in sodium, added sugar, and fat. An example eating plan is called the DASH (Dietary Approaches to Stop Hypertension) diet. To eat this way: Eat plenty of fresh fruits and vegetables. Try to fill one half of your plate at each meal with fruits and vegetables. Eat whole grains, such as whole-wheat pasta, brown rice, or whole-grain bread. Fill about one fourth of your plate with whole grains. Eat or drink low-fat dairy products, such as skim milk or low-fat yogurt. Avoid fatty cuts of meat, processed or cured meats, and poultry with skin. Fill about one fourth of your plate with lean proteins, such as fish, chicken without skin, beans, eggs, or tofu. Avoid pre-made and processed foods. These tend to be higher in sodium, added sugar, and fat. Reduce your daily sodium intake. Most people with hypertension should eat less than 1,500 mg of sodium a day. Do not drink alcohol if: Your health care provider tells you not to drink. You are pregnant, may be pregnant, or are planning to become pregnant. If you drink alcohol: Limit how much you use to: 0-1 drink a day for women. 0-2 drinks a day for men. Be aware of how much alcohol is in your drink. In the [place] one drink equals one 12 oz bottle of beer (355 mL), one 5 oz glass of wine (148 mL), or one 1 oz glass of hard liquor (44 mL). Lifestyle  Work with your health care provider to maintain a healthy body weight or to lose weight. Ask what an ideal weight is for you. Get at least 30 minutes of exercise most days of the week. Activities may include walking, swimming, or biking. Include exercise to strengthen your muscles (resistance exercise), such as Pilates or lifting weights, as  part of your weekly exercise routine. Try to do these types of exercises for 30 minutes at least 3 days a week. Do not use any products that contain nicotine or tobacco, such as cigarettes, e-cigarettes, and chewing tobacco. If you need help quitting, ask your health care provider. Monitor your blood pressure at home as told by your health care provider. Keep all follow-up visits as told by your health care provider. This is important. Medicines Take over-the-counter and prescription medicines only as told by your health care provider. Follow directions carefully. Blood pressure medicines must be taken as prescribed. Do not skip doses of blood pressure medicine. Doing this puts you at risk for problems and can make the medicine less effective. Ask your health care provider about side effects or reactions to medicines that you should watch for. Contact a health care provider if you: Think you are having a reaction to a medicine you are taking. Have headaches that keep coming back (recurring). Feel dizzy. Have swelling in your ankles. Have trouble with your vision. Get help right away if you: Develop a severe headache or confusion. Have unusual weakness or numbness. Feel faint. Have severe pain in your chest or abdomen. Vomit repeatedly. Have trouble  breathing. Summary Hypertension is when the force of blood pumping through your arteries is too strong. If this condition is not controlled, it may put you at risk for serious complications. Your personal target blood pressure may vary depending on your medical conditions, your age, and other factors. For most people, a normal blood pressure is less than 120/80. Hypertension is treated with lifestyle changes, medicines, or a combination of both. Lifestyle changes include losing weight, eating a healthy, low-sodium diet, exercising more, and limiting alcohol. This information is not intended to replace advice given to you by your health care  provider. Make sure you discuss any questions you have with your health care provider. Document Revised: 07/07/2018 Document Reviewed: 07/07/2018 Elsevier Patient Education  2022 [place]. Spironolactone Tablets What is this medication? SPIRONOLACTONE ([place] LAK tone) treats high blood pressure and heart failure. It may also be used to reduce swelling related to heart, kidney, or liver disease. It helps your kidneys remove more fluid and salt from your blood through the urine without losing too much potassium. It belongs to a group of medications called diuretics. This medicine may be used for other purposes; ask your health care provider or pharmacist if you have questions. COMMON BRAND NAME(S): Aldactone What should I tell my care team before I take this medication? They need to know if you have any of these conditions: Addison's disease or low adrenal gland function High blood level of potassium Kidney disease Liver disease An unusual or allergic reaction to spironolactone, other medications, foods, dyes, or preservatives Pregnant or trying to get pregnant Breast-feeding How should I use this medication? Take this medication by mouth. Take it as directed on the prescription label at the same time every day. You can take it with or without food. You should always take it the same way. Keep taking it unless your care team tells you to stop. Talk to your care team about the use of this medication in children. Special care may be needed. Overdosage: If you think you have taken too much of this medicine contact a poison control center or emergency room at once. NOTE: This medicine is only for you. Do not share this medicine with others. What if I miss a dose? If you miss a dose, take it as soon as you can. If it is almost time for your next dose, take only that dose. Do not take double or extra doses. What may interact with this medication? Do not take this medication with any of  the following: Cidofovir Eplerenone Tranylcypromine This medication may also interact with the following: Aspirin Certain medications for blood pressure or heart disease like benazepril, lisinopril, losartan, valsartan Certain medications that treat or prevent blood clots like heparin and enoxaparin Cholestyramine Cyclosporine Digoxin Lithium Medications that relax muscles for surgery NSAIDs, medications for pain and inflammation, like ibuprofen or naproxen Other diuretics Potassium salts or supplements Steroid medications like prednisone or cortisone Trimethoprim This list may not describe all possible interactions. Give your health care provider a list of all the medicines, herbs, non-prescription drugs, or dietary supplements you use. Also tell them if you smoke, drink alcohol, or use illegal drugs. Some items may interact with your medicine. What should I watch for while using this medication? Visit your care team for regular checks on your progress. Check your blood pressure as directed. Ask your care team what your blood pressure should be. Also, find out when you should contact him or her. Do not treat yourself  for coughs, colds, or pain while you are using this medication without asking your care team for advice. Some medications may increase your blood pressure. Check with your care team if you have severe diarrhea, nausea, and vomiting, or if you sweat a lot. The loss of too much body fluid may make it dangerous for you to take this medication. You may need to be on a special diet while taking this medication. Ask your care team. Also, find out how many glasses of fluid you need to drink each day. You may get drowsy or dizzy. Do not drive, use machinery, or do anything that needs mental alertness until you know how this medication affects you. Do not stand or sit up quickly, especially if you are an older patient. This reduces the risk of dizzy or fainting spells. Alcohol may  interfere with the effects of this medication. Avoid alcoholic drinks. Avoid salt substitutes unless you are told otherwise by your care team. What side effects may I notice from receiving this medication? Side effects that you should report to your care team as soon as possible: Allergic reactions--skin rash, itching, hives, swelling of the face, lips, tongue, or throat Dehydration--increased thirst, dry mouth, feeling faint or lightheaded, headache, dark yellow or brown urine High potassium level--muscle weakness, fast or irregular heartbeat Kidney injury--decrease in the amount of urine, swelling of the ankles, hands, or feet Low blood pressure--dizziness, feeling faint or lightheaded, blurry vision Low sodium level--muscle weakness, fatigue, dizziness, headache, confusion Side effects that usually do not require medical attention (report to your care team if they continue or are bothersome): Breast pain or tenderness Changes in sex drive or performance Dizziness Headache Irregular menstrual cycles or spotting Unexpected breast tissue growth This list may not describe all possible side effects. Call your doctor for medical advice about side effects. You may report side effects to FDA at 1-800-FDA-1088. Where should I keep my medication? Keep out of the reach of children and pets. Store below 25 degrees C (77 degrees F). Get rid of any unused medication after the expiration date. To get rid of medications that are no longer needed or have expired: Take the medication to a medication take-back program. Check with your pharmacy or law enforcement to find a location. If you cannot return the medication, check the label or package insert to see if the medication should be thrown out in the garbage or flushed down the toilet. If you are not sure, ask your care team. If it is safe to put into the trash, take the medication out of the container. Mix the medication with cat litter, dirt, coffee  grounds, or other unwanted substance. Seal the mixture in a bag or container. Put it in the trash. NOTE: This sheet is a summary. It may not cover all possible information. If you have questions about this medicine, talk to your doctor, pharmacist, or health care provider.  2022 Elsevier/Gold Standard (2021-07-16 00:00:00) Stress, Adult Stress is a normal reaction to life events. Stress is what you feel when life demands more than you are used to, or more than you think you can handle. Some stress can be useful, such as studying for a test or meeting a deadline at work. Stress that occurs too often or for too long can cause problems. Long-lasting stress is called chronic stress. Chronic stress can affect your emotional health and interfere with relationships and normal daily activities. Too much stress can weaken your body's defense system (immune system) and increase  your risk for physical illness. If you already have a medical problem, stress can make it worse. What are the causes? All sorts of life events can cause stress. An event that causes stress for one person may not be stressful for someone else. Major life events, whether positive or negative, commonly cause stress. Examples include: Losing a job or starting a new job. Losing a loved one. Moving to a new town or home. Getting married or divorced. Having a baby. Getting injured or sick. Less obvious life events can also cause stress, especially if they occur day after day or in combination with each other. Examples include: Working long hours. Driving in traffic. Caring for children. Being in debt. Being in a difficult relationship. What are the signs or symptoms? Stress can cause emotional and physical symptoms and can lead to unhealthy behaviors. These include the following: Emotional symptoms Anxiety. This is feeling worried, afraid, on edge, overwhelmed, or out of control. Anger, including irritation or  impatience. Depression. This is feeling sad, down, helpless, or guilty. Trouble focusing, remembering, or making decisions. Physical symptoms Aches and pains. These may affect your head, neck, back, stomach, or other areas of your body. Tight muscles or a clenched jaw. Low energy. Trouble sleeping. Unhealthy behaviors Eating to feel better (overeating) or skipping meals. Working too much or putting off tasks. Smoking, drinking alcohol, or using drugs to feel better. How is this diagnosed? A stress disorder is diagnosed through an assessment by your health care provider. A stress disorder may be diagnosed based on: Your symptoms and any stressful life events. Your medical history. Tests to rule out other causes of your symptoms. Depending on your condition, your health care provider may refer you to a specialist for further evaluation. How is this treated? Stress management techniques are the recommended treatment for stress. Medicine is not typically recommended for treating stress. Techniques to reduce your reaction to stressful life events include: Identifying stress. Monitor yourself for symptoms of stress and notice what causes stress for you. These skills may help you to avoid or prepare for stressful events. Managing time. Set your priorities, keep a calendar of events, and learn to say no. These actions can help you avoid taking on too much. Techniques for dealing with stress include: Rethinking the problem. Try to think realistically about stressful events rather than ignoring them or overreacting. Try to find the positives in a stressful situation rather than focusing on the negatives. Exercise. Physical exercise can release both physical and emotional tension. The key is to find a form of exercise that you enjoy and do it regularly. Relaxation techniques. These relax the body and mind. Find one or more that you enjoy and use the techniques regularly. Examples include: Meditation,  deep breathing, or progressive relaxation techniques. Yoga or tai chi. Biofeedback, mindfulness techniques, or journaling. Listening to music, being in nature, or taking part in other hobbies. Practicing a healthy lifestyle. Eat a balanced diet, drink plenty of water, limit or avoid caffeine, and get plenty of sleep. Having a strong support network. Spend time with family, friends, or other people you enjoy being around. Express your feelings and talk things over with someone you trust. Counseling or talk therapy with a mental health provider may help if you are having trouble managing stress by yourself. Follow these instructions at home: Lifestyle  Avoid drugs. Do not use any products that contain nicotine or tobacco. These products include cigarettes, chewing tobacco, and vaping devices, such as e-cigarettes. If you  need help quitting, ask your health care provider. If you drink alcohol: Limit how much you have to: 0-1 drink a day for women who are not pregnant. 0-2 drinks a day for men. Know how much alcohol is in a drink. In the [place] one drink equals one 12 oz bottle of beer (355 mL), one 5 oz glass of wine (148 mL), or one 1 oz glass of hard liquor (44 mL). Do not use alcohol or drugs to relax. Eat a balanced diet that includes fresh fruits and vegetables, whole grains, lean meats, fish, eggs, beans, and low-fat dairy. Avoid processed foods and foods high in added fat, sugar, and salt. Exercise at least 30 minutes on 5 or more days each week. Get 7-8 hours of sleep each night. General instructions  Practice stress management techniques as told by your health care provider. Drink enough fluid to keep your urine pale yellow. Take over-the-counter and prescription medicines only as told by your health care provider. Keep all follow-up visits. This is important. Contact a health care provider if: Your symptoms get worse. You have new symptoms. You feel overwhelmed by your problems  and can no longer manage them by yourself. Get help right away if: You have thoughts of hurting yourself or others. Get help right awayif you feel like you may hurt yourself or others, or have thoughts about taking your own life. Go to your nearest emergency room or: Call 911. Call the [facility] at [phone] or 988. This is open 24 hours a day. Text the Crisis Text Line at 905-458-8449. Summary Stress is a normal reaction to life events. It can cause problems if it happens too often or for too long. Practicing stress management techniques is the best way to treat stress. Counseling or talk therapy with a mental health provider may help if you are having trouble managing stress by yourself. This information is not intended to replace advice given to you by your health care provider. Make sure you discuss any questions you have with your health care provider. Document Revised: 06/06/2021 Document Reviewed: 06/06/2021 Elsevier Patient Education  [place]. Generalized Anxiety Disorder, Adult Generalized anxiety disorder (GAD) is a mental health condition. Unlike normal worries, anxiety related to GAD is not triggered by a specific event. These worries do not fade or get better with time. GAD interferes with relationships, work, and school. GAD symptoms can vary from mild to severe. People with severe GAD can have intense waves of anxiety with physical symptoms that are similar to panic attacks. What are the causes? The exact cause of GAD is not known, but the following are believed to have an impact: Differences in natural brain chemicals. Genes passed down from parents to children. Differences in the way threats are perceived. Development and stress during childhood. Personality. What increases the risk? The following factors may make you more likely to develop this condition: Being female. Having a family history of anxiety disorders. Being very  shy. Experiencing very stressful life events, such as the death of a loved one. Having a very stressful family environment. What are the signs or symptoms? People with GAD often worry excessively about many things in their lives, such as their health and family. Symptoms may also include: Mental and emotional symptoms: Worrying excessively about natural disasters. Fear of being late. Difficulty concentrating. Fears that others are judging your performance. Physical symptoms: Fatigue. Headaches, muscle tension, muscle twitches, trembling, or feeling shaky. Feeling like your heart is pounding  or beating very fast. Feeling out of breath or like you cannot take a deep breath. Having trouble falling asleep or staying asleep, or experiencing restlessness. Sweating. Nausea, diarrhea, or irritable bowel syndrome (IBS). Behavioral symptoms: Experiencing erratic moods or irritability. Avoidance of new situations. Avoidance of people. Extreme difficulty making decisions. How is this diagnosed? This condition is diagnosed based on your symptoms and medical history. You will also have a physical exam. Your health care provider may perform tests to rule out other possible causes of your symptoms. To be diagnosed with GAD, a person must have anxiety that: Is out of his or her control. Affects several different aspects of his or her life, such as work and relationships. Causes distress that makes him or her unable to take part in normal activities. Includes at least three symptoms of GAD, such as restlessness, fatigue, trouble concentrating, irritability, muscle tension, or sleep problems. Before your health care provider can confirm a diagnosis of GAD, these symptoms must be present more days than they are not, and they must last for 6 months or longer. How is this treated? This condition may be treated with: Medicine. Antidepressant medicine is usually prescribed for long-term daily control.  Anti-anxiety medicines may be added in severe cases, especially when panic attacks occur. Talk therapy (psychotherapy). Certain types of talk therapy can be helpful in treating GAD by providing support, education, and guidance. Options include: Cognitive behavioral therapy (CBT). People learn coping skills and self-calming techniques to ease their physical symptoms. They learn to identify unrealistic thoughts and behaviors and to replace them with more appropriate thoughts and behaviors. Acceptance and commitment therapy (ACT). This treatment teaches people how to be mindful as a way to cope with unwanted thoughts and feelings. Biofeedback. This process trains you to manage your body's response (physiological response) through breathing techniques and relaxation methods. You will work with a therapist while machines are used to monitor your physical symptoms. Stress management techniques. These include yoga, meditation, and exercise. A mental health specialist can help determine which treatment is best for you. Some people see improvement with one type of therapy. However, other people require a combination of therapies. Follow these instructions at home: Lifestyle Maintain a consistent routine and schedule. Anticipate stressful situations. Create a plan and allow extra time to work with your plan. Practice stress management or self-calming techniques that you have learned from your therapist or your health care provider. Exercise regularly and spend time outdoors. Eat a healthy diet that includes plenty of vegetables, fruits, whole grains, low-fat dairy products, and lean protein. Do not eat a lot of foods that are high in fat, added sugar, or salt (sodium). Drink plenty of water. Avoid alcohol. Alcohol can increase anxiety. Avoid caffeine and certain over-the-counter cold medicines. These may make you feel worse. Ask your pharmacist which medicines to avoid. General instructions Take  over-the-counter and prescription medicines only as told by your health care provider. Understand that you are likely to have setbacks. Accept this and be kind to yourself as you persist to take better care of yourself. Anticipate stressful situations. Create a plan and allow extra time to work with your plan. Recognize and accept your accomplishments, even if you judge them as small. Spend time with people who care about you. Keep all follow-up visits. This is important. Where to find more information [place]: https://carter.com/ Substance Abuse and Mental Health Services: ktimeonline.com Contact a health care provider if: Your symptoms do not get better. Your  symptoms get worse. You have signs of depression, such as: A persistently sad or irritable mood. Loss of enjoyment in activities that used to bring you joy. Change in weight or eating. Changes in sleeping habits. Get help right away if: You have thoughts about hurting yourself or others. If you ever feel like you may hurt yourself or others, or have thoughts about taking your own life, get help right away. Go to your nearest emergency department or: Call your local emergency services (911 in the [place] Call a suicide crisis helpline, such as the [place] at 570-678-5182 or 988 in the [place]. This is open 24 hours a day in the [place] Text the Crisis Text Line at (928) 077-2386 (in the [place].). Summary Generalized anxiety disorder (GAD) is a mental health condition that involves worry that is not triggered by a specific event. People with GAD often worry excessively about many things in their lives, such as their health and family. GAD may cause symptoms such as restlessness, trouble concentrating, sleep problems, frequent sweating, nausea, diarrhea, headaches, and trembling or muscle twitching. A mental health specialist can help determine which treatment is best for you. Some people see  improvement with one type of therapy. However, other people require a combination of therapies. This information is not intended to replace advice given to you by your health care provider. Make sure you discuss any questions you have with your health care provider. Document Revised: 05/22/2021 Document Reviewed: 02/17/2021 Elsevier Patient Education  [place].

## 2021-10-24 ENCOUNTER — Other Ambulatory Visit: Payer: Self-pay | Admitting: Adult Health

## 2021-10-24 DIAGNOSIS — I10 Essential (primary) hypertension: Secondary | ICD-10-CM

## 2021-10-31 ENCOUNTER — Ambulatory Visit: Payer: BC Managed Care – PPO | Admitting: Internal Medicine

## 2021-10-31 ENCOUNTER — Encounter: Payer: Self-pay | Admitting: Internal Medicine

## 2021-10-31 ENCOUNTER — Other Ambulatory Visit: Payer: Self-pay

## 2021-10-31 ENCOUNTER — Other Ambulatory Visit: Payer: Self-pay | Admitting: Internal Medicine

## 2021-10-31 VITALS — BP 136/86 | HR 101 | Temp 97.1°F | Ht 67.01 in | Wt 179.8 lb

## 2021-10-31 DIAGNOSIS — E039 Hypothyroidism, unspecified: Secondary | ICD-10-CM

## 2021-10-31 DIAGNOSIS — Z23 Encounter for immunization: Secondary | ICD-10-CM | POA: Diagnosis not present

## 2021-10-31 DIAGNOSIS — Z0001 Encounter for general adult medical examination with abnormal findings: Secondary | ICD-10-CM

## 2021-10-31 DIAGNOSIS — E559 Vitamin D deficiency, unspecified: Secondary | ICD-10-CM

## 2021-10-31 DIAGNOSIS — R635 Abnormal weight gain: Secondary | ICD-10-CM

## 2021-10-31 DIAGNOSIS — I10 Essential (primary) hypertension: Secondary | ICD-10-CM

## 2021-10-31 DIAGNOSIS — Z1231 Encounter for screening mammogram for malignant neoplasm of breast: Secondary | ICD-10-CM

## 2021-10-31 DIAGNOSIS — E663 Overweight: Secondary | ICD-10-CM

## 2021-10-31 DIAGNOSIS — Z1389 Encounter for screening for other disorder: Secondary | ICD-10-CM

## 2021-10-31 DIAGNOSIS — N644 Mastodynia: Secondary | ICD-10-CM | POA: Diagnosis not present

## 2021-10-31 DIAGNOSIS — J4520 Mild intermittent asthma, uncomplicated: Secondary | ICD-10-CM | POA: Diagnosis not present

## 2021-10-31 DIAGNOSIS — F339 Major depressive disorder, recurrent, unspecified: Secondary | ICD-10-CM

## 2021-10-31 DIAGNOSIS — L309 Dermatitis, unspecified: Secondary | ICD-10-CM

## 2021-10-31 DIAGNOSIS — F39 Unspecified mood [affective] disorder: Secondary | ICD-10-CM | POA: Insufficient documentation

## 2021-10-31 DIAGNOSIS — F439 Reaction to severe stress, unspecified: Secondary | ICD-10-CM | POA: Insufficient documentation

## 2021-10-31 DIAGNOSIS — D75839 Thrombocytosis, unspecified: Secondary | ICD-10-CM

## 2021-10-31 DIAGNOSIS — R739 Hyperglycemia, unspecified: Secondary | ICD-10-CM

## 2021-10-31 DIAGNOSIS — E611 Iron deficiency: Secondary | ICD-10-CM

## 2021-10-31 DIAGNOSIS — E538 Deficiency of other specified B group vitamins: Secondary | ICD-10-CM

## 2021-10-31 MED ORDER — SPIRONOLACTONE 25 MG PO TABS: 25.0000 mg | ORAL_TABLET | Freq: Every day | ORAL | 3 refills | Status: DC

## 2021-10-31 MED ORDER — UNITHROID 125 MCG PO TABS: 125.0000 ug | ORAL_TABLET | Freq: Every day | ORAL | 0 refills | Status: DC

## 2021-10-31 MED ORDER — LEVOTHYROXINE SODIUM 125 MCG PO TABS: 125.0000 ug | ORAL_TABLET | Freq: Every day | ORAL | 0 refills | Status: DC

## 2021-10-31 MED ORDER — ALBUTEROL SULFATE HFA 108 (90 BASE) MCG/ACT IN AERS: 1.0000 | INHALATION_SPRAY | Freq: Four times a day (QID) | RESPIRATORY_TRACT | 12 refills | Status: DC | PRN

## 2021-10-31 MED ORDER — SPIRONOLACTONE 50 MG PO TABS: 50.0000 mg | ORAL_TABLET | Freq: Every day | ORAL | 3 refills | Status: DC

## 2021-10-31 MED ORDER — FLUOCINONIDE 0.05 % EX CREA: 1.0000 "application " | TOPICAL_CREAM | Freq: Two times a day (BID) | CUTANEOUS | 0 refills | Status: DC

## 2021-10-31 NOTE — Progress Notes (Signed)
Chief Complaint  Patient presents with   Annual Exam   Annual 1. Overweight and stress eating wants to lose weight can discuss with endocrine this is highest wt has been 2. Hypothyroidism tsh 03/19/21 tsh 5.840 did not have meds for some time stopped them now taking unithryoid 125 mcg  3. Depression/stress f/u psych Dr. 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 Rash to left forearm itchy will get records Dr. Evorn Gong  5. Htn on spironolactone 25 was on norvsc 2.5  6. C/o left breast pain 3 oclock  Review of Systems  Constitutional:  Negative for weight loss.  HENT:  Negative for hearing loss.   Eyes:  Negative for blurred vision.  Respiratory:  Negative for shortness of breath.   Cardiovascular:  Negative for chest pain.  Gastrointestinal:  Negative for abdominal pain and blood in stool.  Genitourinary:  Negative for dysuria.  Musculoskeletal:  Negative for falls and joint pain.  Skin:  Negative for rash.  Neurological:  Negative for headaches.  Psychiatric/Behavioral:  Negative for depression.   Past Medical History:  Diagnosis Date   ADD (attention deficit disorder)    Allergy    Anemia    Asthma    COVID-19    Depression    Hashimoto's disease    Hashimoto's thyroiditis    History of chicken pox    History of UTI    Hyperlipidemia    Premature ovarian insufficiency    [facility] 29 Dr. Hughie Closs    Seizures [facility])    medication induced wellbutrin   Past Surgical History:  Procedure Laterality Date   ABDOMINAL HYSTERECTOMY     Dr. Gerarda Fraction 11-04-18   [place] OF UTERUS     2015 and 2018    HYSTERECTOMY ABDOMINAL WITH SALPINGECTOMY Bilateral 11/04/2018   Procedure: HYSTERECTOMY ABDOMINAL WITH BILATERAL SALPINGECTOMY, Exploratory laparotomy;  Surgeon: Isabel Caprice, MD;  Location: [facility];  Service: Gynecology;  Laterality:  Bilateral;   MYOMECTOMY     2015    Family History  Problem Relation Age of Onset   Rheum arthritis Mother    Hashimoto's thyroiditis Mother    Hypertension Mother    Hyperlipidemia Mother    Arthritis Mother    Asthma Mother    Depression Mother    Kidney disease Mother    86 / [place] Mother    Diabetes Mellitus II Father    Hypothyroidism Father    Hypertension Father    Coronary artery disease Father    Hyperlipidemia Father    Arthritis Father    COPD Father    Depression Father    Diabetes Father    Heart disease Father    Kidney disease Father    Bladder Cancer Father    Prostate cancer Father    Stroke Maternal Grandmother    Heart disease Maternal Grandmother    Breast cancer Paternal Grandmother 33   Early death Paternal Grandmother    Cancer Paternal Grandmother        breast died    35 / Stillbirths Paternal 58    Learning disabilities Brother    Hypertension Brother    Depression Brother    Alcohol abuse Maternal Grandfather    Early death Maternal Grandfather    Heart disease Paternal Grandfather    Social History   Socioeconomic History  Marital status: Married    Spouse name: Not on file   Number of children: Not on file   Years of education: Not on file   Highest education level: Not on file  Occupational History   Not on file  Tobacco Use   Smoking status: Never   Smokeless tobacco: Never  Vaping Use   Vaping Use: Never used  Substance and Sexual Activity   Alcohol use: No   Drug use: No   Sexual activity: Yes    Birth control/protection: None  Other Topics Concern   Not on file  Social History Narrative   Married    PT   No kids has pets   White Psychologist, prison and probation services or rehab   Social Determinants of Radio broadcast assistant Strain: Not on file  Food Insecurity: Not on file  Transportation Needs: Not on file  Physical Activity: Not on file  Stress: Not on file  Social Connections: Not on  file  Intimate Partner Violence: Not on file   Current Meds  Medication Sig   amphetamine-dextroamphetamine (ADDERALL) 20 MG tablet Take 20 mg by mouth in the morning, at noon, and at bedtime.   Calcium Carbonate-Vit D-Min (CALTRATE 600+D PLUS PO) Take 1 capsule by mouth daily.   Cetirizine HCl (ZYRTEC PO) Take by mouth.   estradiol (ESTRACE) 1 MG tablet Take 1 mg by mouth daily.   fluocinonide cream (LIDEX) 8.09 % Apply 1 application topically 2 (two) times daily.   FLUoxetine (PROZAC) 40 MG capsule Take 40 mg by mouth daily.    levothyroxine (UNITHROID) 125 MCG tablet Take 1 tablet (125 mcg total) by mouth daily before breakfast. 30 min before food brand   thyroid (ARMOUR) 90 MG tablet Take 125 mg by mouth daily. Patient takes 90 mg 6 days 45 mg on Sunday.   UNITHROID 125 MCG tablet Take 1 tablet (125 mcg total) by mouth daily before breakfast. 30 minutes before food   [DISCONTINUED] albuterol (VENTOLIN HFA) 108 (90 Base) MCG/ACT inhaler Inhale 1-2 puffs into the lungs every 6 (six) hours as needed for wheezing or shortness of breath.   [DISCONTINUED] levothyroxine (SYNTHROID) 125 MCG tablet Take 1 tablet (125 mcg total) by mouth daily. 30 minutes before food   [DISCONTINUED] spironolactone (ALDACTONE) 25 MG tablet Take 1 tablet (25 mg total) by mouth daily. If BP <130<80 increase to 1 pill daily in am   Allergies  Allergen Reactions   Amlodipine     Swelling  Other reaction(s): Other (See Comments) Swelling Swelling   Bupropion Other (See Comments)    Seizures   Other reaction(s): Other (See Comments), Other (See Comments), Other (See Comments) Seizures  seizure Seizures    Latex Other (See Comments) and Hives    Itching, sneezing Other reaction(s): Other (See Comments) Itching, sneezing Itching, sneezing Itching, sneezing  Itching, sneezing   Other Other (See Comments)    Tree pollen   Other reaction(s): Other (See Comments), Other (See Comments) Tree pollen   unknown Tree pollen    No results found for this or any previous visit (from the past 2160 hour(s)). Objective  Body mass index is 28.15 kg/m. Wt Readings from Last 3 Encounters:  10/31/21 179 lb 12.8 oz (81.6 kg)  09/24/21 175 lb (79.4 kg)  11/13/20 174 lb (78.9 kg)   Temp Readings from Last 3 Encounters:  10/31/21 (!) 97.1 F (36.2 C) (Temporal)  03/08/20 99.2 F (37.3 C) (Oral)  08/14/19 98.4 F (36.9 C) (Oral)  BP Readings from Last 3 Encounters:  10/31/21 136/86  09/24/21 (!) 140/100  11/13/20 128/85   Pulse Readings from Last 3 Encounters:  10/31/21 (!) 101  11/13/20 97  03/08/20 (!) 101    Physical Exam Vitals and nursing note reviewed.  Constitutional:      Appearance: Normal appearance. She is well-developed and well-groomed.  HENT:     Head: Normocephalic and atraumatic.  Eyes:     Conjunctiva/sclera: Conjunctivae normal.     Pupils: Pupils are equal, round, and reactive to light.  Cardiovascular:     Rate and Rhythm: Normal rate and regular rhythm.     Heart sounds: Normal heart sounds. No murmur heard. Pulmonary:     Effort: Pulmonary effort is normal.     Breath sounds: Normal breath sounds.  Abdominal:     General: Abdomen is flat. Bowel sounds are normal.     Tenderness: There is no abdominal tenderness.  Musculoskeletal:        General: No tenderness.  Skin:    General: Skin is warm and dry.  Neurological:     General: No focal deficit present.     Mental Status: She is alert and oriented to person, place, and time. Mental status is at baseline.     Cranial Nerves: Cranial nerves 2-12 are intact.     Gait: Gait is intact.  Psychiatric:        Attention and Perception: Attention and perception normal.        Mood and Affect: Mood and affect normal.        Speech: Speech normal.        Behavior: Behavior normal. Behavior is cooperative.        Thought Content: Thought content normal.        Cognition and Memory: Cognition and memory  normal.        Judgment: Judgment normal.    Assessment  Plan  Encounter for general adult medical examination with abnormal findings Needs flu shot - Plan: Flu Vaccine QUAD 6+ mos PF IM (Fluarix Quad PF)  Hypertension Increased spironolactone 25 to 50 mg qam due to leg swelling  Breast pain, left - Plan: MM DIAG BREAST TOMO BILATERAL,US left 3 oclock breast pain   Mild intermittent asthma without complication - Plan: albuterol (VENTOLIN HFA) 108 (90 Base) MCG/ACT inhaler  Hypothyroidism, unspecified type - Plan: UNITHROID 125 MCG tablet, levothyroxine (SYNTHROID) 125 MCG tablet, levothyroxine (UNITHROID) 125 MCG tablet,   Vitamin D deficiency - Plan: Vitamin D (25 hydroxy) Vitamin B12 deficiency - Plan: Vitamin B12 Iron deficiency - Plan: IBC + Ferritin ic  Dermatitis - Plan: fluocinonide cream (LIDEX) 0.05 % left arm ROI Dr. Evorn Gong   Weight gain Overweight (BMI 25.0-29.9) Rec healthy diet and exercise  Disc with Dr. Gabriel Carina   Stress Depression, recurrent [facility])  F/u Dr. Harlene Ramus  HM  Flu shot today  Tdap utd 2/2 pfizier had and 1 moderna    Referred 12/28/2018 mammo repeat  Negative, ordered mammo 2021  -prior mammogram 10/03/13 normal  Referred DEXA    Never smoker   Colonoscopy due age 72   Pap 04/16/17 neg pap neg HPV PFW s/p hysterectomy 11/04/18 with uterus cervix and b/l fallopian tubes removed    Right thyroid FNA 29/92/42 benign follicular cells, rare oncocytic metaplastic cells and histiocytes consistent with MNG (benign thyriod nodules) neg malignancy. Thyroid US 09/12/13 thyroiditis, solitary nodule right thyroid increased in size  -f/u [facility] endocrine Dr. Gabriel Carina tsh 10/28/19 1.819 Psych  Dr. Diamantina Monks Dr. Evorn Gong  Rheum Dr. Meda Coffee Endocrine Dr. Gabriel Carina  Provider: Dr. Olivia Mackie McLean-Scocuzza-Internal Medicine

## 2021-11-01 ENCOUNTER — Encounter: Payer: Self-pay | Admitting: Internal Medicine

## 2021-11-01 NOTE — Telephone Encounter (Signed)
For your information  

## 2021-11-01 NOTE — Telephone Encounter (Signed)
Please advise 

## 2021-11-02 ENCOUNTER — Encounter: Payer: Self-pay | Admitting: Internal Medicine

## 2021-11-04 MED ORDER — LOSARTAN POTASSIUM-HCTZ 50-12.5 MG PO TABS: 0.5000 | ORAL_TABLET | Freq: Every day | ORAL | 3 refills | Status: DC

## 2021-11-04 NOTE — Addendum Note (Signed)
Addended by: Orland Mustard on: 11/04/2021 09:18 PM   Modules accepted: Orders

## 2021-11-05 NOTE — Telephone Encounter (Signed)
Please advise 

## 2021-11-14 ENCOUNTER — Other Ambulatory Visit: Payer: BC Managed Care – PPO

## 2021-11-25 ENCOUNTER — Other Ambulatory Visit (INDEPENDENT_AMBULATORY_CARE_PROVIDER_SITE_OTHER): Payer: BC Managed Care – PPO

## 2021-11-25 ENCOUNTER — Other Ambulatory Visit: Payer: Self-pay

## 2021-11-25 ENCOUNTER — Encounter: Payer: Self-pay | Admitting: Internal Medicine

## 2021-11-25 DIAGNOSIS — I10 Essential (primary) hypertension: Secondary | ICD-10-CM

## 2021-11-25 DIAGNOSIS — E559 Vitamin D deficiency, unspecified: Secondary | ICD-10-CM | POA: Diagnosis not present

## 2021-11-25 DIAGNOSIS — Z1389 Encounter for screening for other disorder: Secondary | ICD-10-CM

## 2021-11-25 DIAGNOSIS — E611 Iron deficiency: Secondary | ICD-10-CM | POA: Diagnosis not present

## 2021-11-25 DIAGNOSIS — E538 Deficiency of other specified B group vitamins: Secondary | ICD-10-CM | POA: Diagnosis not present

## 2021-11-25 DIAGNOSIS — R739 Hyperglycemia, unspecified: Secondary | ICD-10-CM | POA: Diagnosis not present

## 2021-11-25 LAB — CBC WITH DIFFERENTIAL/PLATELET
Basophils Absolute: 0 10*3/uL (ref 0.0–0.1)
Basophils Relative: 0.9 % (ref 0.0–3.0)
Eosinophils Absolute: 0.1 10*3/uL (ref 0.0–0.7)
Eosinophils Relative: 2.8 % (ref 0.0–5.0)
HCT: 39.3 % (ref 36.0–46.0)
Hemoglobin: 13 g/dL (ref 12.0–15.0)
Lymphocytes Relative: 30.2 % (ref 12.0–46.0)
Lymphs Abs: 1.2 10*3/uL (ref 0.7–4.0)
MCHC: 33.1 g/dL (ref 30.0–36.0)
MCV: 89.2 fl (ref 78.0–100.0)
Monocytes Absolute: 0.4 10*3/uL (ref 0.1–1.0)
Monocytes Relative: 9.5 % (ref 3.0–12.0)
Neutro Abs: 2.3 10*3/uL (ref 1.4–7.7)
Neutrophils Relative %: 56.6 % (ref 43.0–77.0)
Platelets: 423 10*3/uL — ABNORMAL HIGH (ref 150.0–400.0)
RBC: 4.41 Mil/uL (ref 3.87–5.11)
RDW: 13.5 % (ref 11.5–15.5)
WBC: 4 10*3/uL (ref 4.0–10.5)

## 2021-11-25 LAB — IBC + FERRITIN
Ferritin: 21.2 ng/mL (ref 10.0–291.0)
Iron: 118 ug/dL (ref 42–145)
Saturation Ratios: 29.4 % (ref 20.0–50.0)
TIBC: 401.8 ug/dL (ref 250.0–450.0)
Transferrin: 287 mg/dL (ref 212.0–360.0)

## 2021-11-25 LAB — LIPID PANEL
Cholesterol: 226 mg/dL — ABNORMAL HIGH (ref 0–200)
HDL: 50.3 mg/dL (ref >39.00)
LDL Cholesterol: 140 mg/dL — ABNORMAL HIGH (ref 0–99)
NonHDL: 175.36
Total CHOL/HDL Ratio: 4
Triglycerides: 176 mg/dL — ABNORMAL HIGH (ref 0.0–149.0)
VLDL: 35.2 mg/dL (ref 0.0–40.0)

## 2021-11-25 LAB — COMPREHENSIVE METABOLIC PANEL
ALT: 20 U/L (ref 0–35)
AST: 22 U/L (ref 0–37)
Albumin: 4.3 g/dL (ref 3.5–5.2)
Alkaline Phosphatase: 51 U/L (ref 39–117)
BUN: 15 mg/dL (ref 6–23)
CO2: 30 mEq/L (ref 19–32)
Calcium: 9 mg/dL (ref 8.4–10.5)
Chloride: 99 mEq/L (ref 96–112)
Creatinine, Ser: 0.84 mg/dL (ref 0.40–1.20)
GFR: 84.89 mL/min (ref >60.00)
Glucose, Bld: 98 mg/dL (ref 70–99)
Potassium: 3.8 mEq/L (ref 3.5–5.1)
Sodium: 135 mEq/L (ref 135–145)
Total Bilirubin: 0.3 mg/dL (ref 0.2–1.2)
Total Protein: 6.9 g/dL (ref 6.0–8.3)

## 2021-11-25 LAB — HEMOGLOBIN A1C: Hgb A1c MFr Bld: 6.1 % (ref 4.6–6.5)

## 2021-11-25 LAB — VITAMIN B12: Vitamin B-12: 677 pg/mL (ref 211–911)

## 2021-11-25 LAB — VITAMIN D 25 HYDROXY (VIT D DEFICIENCY, FRACTURES): VITD: 40.27 ng/mL (ref 30.00–100.00)

## 2021-11-25 NOTE — Progress Notes (Signed)
Please find out if she wants hematology evaluation - she has read Dr, Audrie Gallus note.

## 2021-11-26 LAB — URINALYSIS, ROUTINE W REFLEX MICROSCOPIC
Bilirubin Urine: NEGATIVE
Glucose, UA: NEGATIVE
Hgb urine dipstick: NEGATIVE
Ketones, ur: NEGATIVE
Leukocytes,Ua: NEGATIVE
Nitrite: NEGATIVE
Protein, ur: NEGATIVE
Specific Gravity, Urine: 1.011 (ref 1.001–1.035)
pH: 7 (ref 5.0–8.0)

## 2021-11-26 NOTE — Telephone Encounter (Signed)
Cynthia Beam, FNP  11/25/2021  5:57 PM EST Back to Top    Please find out if she wants hematology evaluation - she has read Dr, Audrie Gallus note.    Cynthia Glow McLean-Scocuzza, MD  11/25/2021  3:25 PM EST     Platelets slightly elevated but hemoglobin is normal  This happened 3 years ago consider consult with hematology let me know if desired?   Iron normal   Cholesterol elevated and worse  -rec healthy diet and exercise -does she want to try statin cholesterol medication?    B12 and vitamin D normal    A1c=6.1=prediabetes rec healthy diet and exercise    Liver kidneys normal    Urine pending   How has your blood pressure been and what are you currently taking? And tolerating meds?   Please advise

## 2021-11-27 NOTE — Addendum Note (Signed)
Addended by: Orland Mustard on: 11/27/2021 04:46 PM   Modules accepted: Orders

## 2021-11-28 ENCOUNTER — Other Ambulatory Visit: Payer: Self-pay | Admitting: Internal Medicine

## 2021-11-28 DIAGNOSIS — J309 Allergic rhinitis, unspecified: Secondary | ICD-10-CM

## 2021-12-04 ENCOUNTER — Encounter: Payer: Self-pay | Admitting: Oncology

## 2021-12-04 ENCOUNTER — Inpatient Hospital Stay: Payer: BC Managed Care – PPO | Attending: Oncology | Admitting: Oncology

## 2021-12-04 ENCOUNTER — Inpatient Hospital Stay: Payer: BC Managed Care – PPO

## 2021-12-04 ENCOUNTER — Other Ambulatory Visit: Payer: Self-pay

## 2021-12-04 VITALS — BP 124/86 | HR 92 | Temp 97.8°F | Resp 16 | Ht 67.01 in | Wt 185.0 lb

## 2021-12-04 DIAGNOSIS — E063 Autoimmune thyroiditis: Secondary | ICD-10-CM | POA: Diagnosis not present

## 2021-12-04 DIAGNOSIS — K581 Irritable bowel syndrome with constipation: Secondary | ICD-10-CM | POA: Insufficient documentation

## 2021-12-04 DIAGNOSIS — E785 Hyperlipidemia, unspecified: Secondary | ICD-10-CM | POA: Diagnosis not present

## 2021-12-04 DIAGNOSIS — M8580 Other specified disorders of bone density and structure, unspecified site: Secondary | ICD-10-CM | POA: Insufficient documentation

## 2021-12-04 DIAGNOSIS — Z79899 Other long term (current) drug therapy: Secondary | ICD-10-CM | POA: Diagnosis not present

## 2021-12-04 DIAGNOSIS — Z566 Other physical and mental strain related to work: Secondary | ICD-10-CM | POA: Insufficient documentation

## 2021-12-04 DIAGNOSIS — E8940 Asymptomatic postprocedural ovarian failure: Secondary | ICD-10-CM | POA: Insufficient documentation

## 2021-12-04 DIAGNOSIS — F988 Other specified behavioral and emotional disorders with onset usually occurring in childhood and adolescence: Secondary | ICD-10-CM | POA: Insufficient documentation

## 2021-12-04 DIAGNOSIS — F419 Anxiety disorder, unspecified: Secondary | ICD-10-CM | POA: Insufficient documentation

## 2021-12-04 DIAGNOSIS — I10 Essential (primary) hypertension: Secondary | ICD-10-CM | POA: Insufficient documentation

## 2021-12-04 DIAGNOSIS — F32A Depression, unspecified: Secondary | ICD-10-CM | POA: Insufficient documentation

## 2021-12-04 DIAGNOSIS — E663 Overweight: Secondary | ICD-10-CM | POA: Diagnosis not present

## 2021-12-04 DIAGNOSIS — D75839 Thrombocytosis, unspecified: Secondary | ICD-10-CM | POA: Diagnosis not present

## 2021-12-04 NOTE — Progress Notes (Signed)
Pt  said that she has had 2 seizures several years ago and does not do any meds. She is stressed because of helping her parents that have medical issues and she works in nursing home. She knows her plt are elevated and does not know why.

## 2021-12-04 NOTE — Progress Notes (Signed)
Hematology/Oncology Consult note [facility] [phone] [phone]  Patient Care Team: McLean-Scocuzza, Pasty Spillers, MD as PCP - General (Internal Medicine)   Name of the patient: Cynthia Burke  [id number]  January 18, 1976    Reason for referral-thrombocytosis   Referring physician-Dr. Mclean-Scocuzza  Date of visit: 12/04/21   History of presenting illness- Patient is a 46 year old female referred for thrombocytosis.  Her most recent CBC from 11/25/2021 showed a white count of 4, H&H of 13/39.3 and a platelet count of 423.  Her prior platelet count in 2019 was normal at 270.  Platelet counts between October and December 2019 have been normal except for 1 occasion admitted as 409.no prior h/o thrombosis. She is tearful today due to stress at work and personal issues.   ECOG PS- 0  Pain scale- 0   Review of systems- Review of Systems  Constitutional:  Negative for chills, fever, malaise/fatigue and weight loss.  HENT:  Negative for congestion, ear discharge and nosebleeds.   Eyes:  Negative for blurred vision.  Respiratory:  Negative for cough, hemoptysis, sputum production, shortness of breath and wheezing.   Cardiovascular:  Negative for chest pain, palpitations, orthopnea and claudication.  Gastrointestinal:  Negative for abdominal pain, blood in stool, constipation, diarrhea, heartburn, melena, nausea and vomiting.  Genitourinary:  Negative for dysuria, flank pain, frequency, hematuria and urgency.  Musculoskeletal:  Negative for back pain, joint pain and myalgias.  Skin:  Negative for rash.  Neurological:  Negative for dizziness, tingling, focal weakness, seizures, weakness and headaches.  Endo/Heme/Allergies:  Does not bruise/bleed easily.  Psychiatric/Behavioral:  Negative for depression and suicidal ideas. The patient is nervous/anxious. The patient does not have insomnia.    Allergies  Allergen Reactions   Amlodipine      Swelling  Other reaction(s): Other (See Comments) Swelling Swelling   Bupropion Other (See Comments)    Seizures   Other reaction(s): Other (See Comments), Other (See Comments), Other (See Comments) Seizures  seizure Seizures    Spironolactone     ?eyelid swelling worse at 50mg  dose   Latex Other (See Comments) and Hives    Itching, sneezing Other reaction(s): Other (See Comments) Itching, sneezing Itching, sneezing Itching, sneezing  Itching, sneezing   Other Other (See Comments)    Tree pollen   Other reaction(s): Other (See Comments), Other (See Comments) Tree pollen  unknown Tree pollen     Patient Active Problem List   Diagnosis Date Noted   Stress 10/31/2021   Depression, recurrent (HCC) 10/31/2021   Osteopenia 04/12/2020   Arthralgia 03/26/2020   Need for hepatitis B screening test 03/26/2020   Acne vulgaris 03/08/2020   Rash 03/08/2020   Essential hypertension 12/08/2019   Allergic rhinitis 12/08/2019   Irritable bowel syndrome with constipation 06/03/2019   Annual physical exam 06/03/2019   Anemia 12/21/2018   Chronic constipation 12/03/2018   Hypothyroidism 09/02/2018   Overweight (BMI 25.0-29.9) 09/02/2018   ADHD 09/02/2018   Anxiety and depression 09/02/2018   Asthma 09/02/2018   HLD (hyperlipidemia) 09/02/2018   Hashimoto's thyroiditis 09/02/2018   Allergies 09/02/2018   Thumb pain, left 09/02/2018   Premature ovarian failure 09/02/2018   Hypothyroidism due to Hashimoto's thyroiditis 12/29/2016     Past Medical History:  Diagnosis Date   ADD (attention deficit disorder)    Allergy    Anemia    Asthma    COVID-19    Depression    Hashimoto's disease    Hashimoto's thyroiditis  History of chicken pox    History of UTI    Hyperlipidemia    Premature ovarian insufficiency    [facility] Dr. Hughie Closs    Seizures [facility])    medication induced wellbutrin     Past Surgical History:  Procedure Laterality Date   ABDOMINAL HYSTERECTOMY      Dr. Gerarda Fraction 11-04-18   [place] OF UTERUS     2015 and 2018    HYSTERECTOMY ABDOMINAL WITH SALPINGECTOMY Bilateral 11/04/2018   Procedure: HYSTERECTOMY ABDOMINAL WITH BILATERAL SALPINGECTOMY, Exploratory laparotomy;  Surgeon: Isabel Caprice, MD;  Location: [facility];  Service: Gynecology;  Laterality: Bilateral;   MYOMECTOMY     2015     Social History   Socioeconomic History   Marital status: Married    Spouse name: Not on file   Number of children: Not on file   Years of education: Not on file   Highest education level: Not on file  Occupational History   Not on file  Tobacco Use   Smoking status: Never   Smokeless tobacco: Never  Vaping Use   Vaping Use: Never used  Substance and Sexual Activity   Alcohol use: No   Drug use: No   Sexual activity: Yes    Birth control/protection: None  Other Topics Concern   Not on file  Social History Narrative   Married    PT   No kids has pets   White Psychologist, prison and probation services or rehab   Social Determinants of Radio broadcast assistant Strain: Not on file  Food Insecurity: Not on file  Transportation Needs: Not on file  Physical Activity: Not on file  Stress: Not on file  Social Connections: Not on file  Intimate Partner Violence: Not on file     Family History  Problem Relation Age of Onset   Rheum arthritis Mother    Hashimoto's thyroiditis Mother    Hypertension Mother    Hyperlipidemia Mother    Arthritis Mother    Asthma Mother    Depression Mother    Kidney disease Mother    Miscarriages / [place] Mother    Diabetes Mellitus II Father    Hypothyroidism Father    Hypertension Father    Coronary artery disease Father    Hyperlipidemia Father    Arthritis Father    COPD Father    Depression Father    Diabetes Father    Heart disease Father    Kidney disease Father    Bladder Cancer Father    Prostate cancer Father    Stroke Maternal Grandmother    Heart disease Maternal Grandmother     Breast cancer Paternal Grandmother 90   Early death Paternal Grandmother    Cancer Paternal Grandmother        breast died    54 / Stillbirths Paternal Grandmother    Learning disabilities Brother    Hypertension Brother    Depression Brother    Alcohol abuse Maternal Grandfather    Early death Maternal Grandfather    Heart disease Paternal Grandfather      Current Outpatient Medications:    albuterol (VENTOLIN HFA) 108 (90 Base) MCG/ACT inhaler, Inhale 1-2 puffs into the lungs every 6 (six) hours as needed for wheezing or shortness of breath., Disp: 1 g, Rfl: 12   Amphet-Dextroamphet 3-Bead ER (MYDAYIS) 50 MG CP24, Take 50 mg by mouth daily. [place] (Patient not taking: Reported on 10/31/2021), Disp: , Rfl:    amphetamine-dextroamphetamine (ADDERALL) 20  MG tablet, Take 20 mg by mouth in the morning, at noon, and at bedtime., Disp: , Rfl:    Calcium Carbonate-Vit D-Min (CALTRATE 600+D PLUS PO), Take 1 capsule by mouth daily., Disp: , Rfl:    Cetirizine HCl (ZYRTEC PO), Take by mouth., Disp: , Rfl:    Cholecalciferol (VITAMIN D3) 25 MCG (1000 UT) CAPS, Take 2,000 Units by mouth daily. (Patient not taking: Reported on 10/31/2021), Disp: , Rfl:    estradiol (ESTRACE) 1 MG tablet, Take 1 mg by mouth daily., Disp: , Rfl:    fluocinonide cream (LIDEX) 0.17 %, Apply 1 application topically 2 (two) times daily., Disp: 60 g, Rfl: 0   FLUoxetine (PROZAC) 40 MG capsule, Take 40 mg by mouth daily. , Disp: , Rfl:    levothyroxine (SYNTHROID) 125 MCG tablet, Take 1 tablet (125 mcg total) by mouth daily. 30 minutes before food BRAND Unithroid, Disp: 30 tablet, Rfl: 0   levothyroxine (SYNTHROID) 125 MCG tablet, TAKE 1 TABLET (125 MCG TOTAL) BY MOUTH DAILY BEFORE BREAKFAST. 30 MIN BEFORE FOOD BRAND, Disp: 30 tablet, Rfl: 0   losartan-hydrochlorothiazide (HYZAAR) 50-12.5 MG tablet, Take 0.5 tablets by mouth daily. Increase to 1 tablet if BP is not <130/<80 after 3 days, Disp: 90 tablet, Rfl: 3    thyroid (ARMOUR) 90 MG tablet, Take 125 mg by mouth daily. Patient takes 90 mg 6 days 45 mg on Sunday., Disp: , Rfl:    UNITHROID 125 MCG tablet, Take 1 tablet (125 mcg total) by mouth daily before breakfast. 30 minutes before food, Disp: 90 tablet, Rfl: 0   Physical exam:  Vitals:   12/04/21 1450  BP: 124/86  Pulse: 92  Resp: 16  Temp: 97.8 F (36.6 C)  TempSrc: Oral  Weight: 185 lb (83.9 kg)  Height: 5' 7.01" (1.702 m)   Physical Exam Constitutional:      General: She is not in acute distress. Cardiovascular:     Rate and Rhythm: Normal rate and regular rhythm.     Heart sounds: Normal heart sounds.  Pulmonary:     Effort: Pulmonary effort is normal.     Breath sounds: Normal breath sounds.  Abdominal:     General: Bowel sounds are normal.     Palpations: Abdomen is soft.     Comments: No palpable hepatosplenomegaly  Lymphadenopathy:     Comments: No palpable cervical, supraclavicular, axillary or inguinal adenopathy    Skin:    General: Skin is warm and dry.  Neurological:     Mental Status: She is alert and oriented to person, place, and time.       CMP Latest Ref Rng & Units 11/25/2021  Glucose 70 - 99 mg/dL 98  BUN 6 - 23 mg/dL 15  Creatinine 0.40 - 1.20 mg/dL 0.84  Sodium 135 - 145 mEq/L 135  Potassium 3.5 - 5.1 mEq/L 3.8  Chloride 96 - 112 mEq/L 99  CO2 19 - 32 mEq/L 30  Calcium 8.4 - 10.5 mg/dL 9.0  Total Protein 6.0 - 8.3 g/dL 6.9  Total Bilirubin 0.2 - 1.2 mg/dL 0.3  Alkaline Phos 39 - 117 U/L 51  AST 0 - 37 U/L 22  ALT 0 - 35 U/L 20   CBC Latest Ref Rng & Units 11/25/2021  WBC 4.0 - 10.5 K/uL 4.0  Hemoglobin 12.0 - 15.0 g/dL 13.0  Hematocrit 36.0 - 46.0 % 39.3  Platelets 150.0 - 400.0 K/uL 423.0(H)   Assessment and plan- Patient is a 46 y.o. female referred for  thrombocytosis  Patient's thrombocytosis is currently mild and I plan to monitor this conservatively without the need for additional tests such as JAK2, CAL R, MPL mutations.  If there  is a consistent rise in her platelet count in the future I will consider doing these tests.  Thrombocytosis can also be secondary/reactive in the setting of iron deficiency.  Although patient is not anemic, she has evidence of mild iron deficiency. Ferritin is <30. I have asked her to try PO iron every other day.     My plan is to repeat CBC with differential ferritin and iron studies and ESR in 6 weeks.  I will see her back in 12 weeks with a CBC with differential.  If there is a consistent rise in her platelet count further lab tests will be considered at that time.   Discussed differences between primary and secondary/ reactive thrombocytosis. Suspect her thrombocytosis is secondary given that it is mild with no steady increase in counts.    Thank you for this kind referral and the opportunity to participate in the care of this patient   Visit Diagnosis 1. Thrombocytosis     Dr. Randa Evens, MD, MPH [facility] at [facility] [phone] 12/04/2021

## 2021-12-07 ENCOUNTER — Other Ambulatory Visit: Payer: Self-pay | Admitting: Internal Medicine

## 2021-12-07 DIAGNOSIS — J309 Allergic rhinitis, unspecified: Secondary | ICD-10-CM

## 2021-12-15 ENCOUNTER — Other Ambulatory Visit: Payer: Self-pay | Admitting: Internal Medicine

## 2021-12-15 DIAGNOSIS — E039 Hypothyroidism, unspecified: Secondary | ICD-10-CM

## 2022-01-08 ENCOUNTER — Other Ambulatory Visit: Payer: Self-pay | Admitting: *Deleted

## 2022-01-08 DIAGNOSIS — D75839 Thrombocytosis, unspecified: Secondary | ICD-10-CM

## 2022-01-09 ENCOUNTER — Other Ambulatory Visit: Payer: Self-pay | Admitting: Internal Medicine

## 2022-01-09 DIAGNOSIS — Z1231 Encounter for screening mammogram for malignant neoplasm of breast: Secondary | ICD-10-CM

## 2022-01-15 ENCOUNTER — Encounter: Payer: Self-pay | Admitting: Oncology

## 2022-01-15 ENCOUNTER — Inpatient Hospital Stay: Payer: BC Managed Care – PPO

## 2022-01-16 ENCOUNTER — Inpatient Hospital Stay: Payer: BC Managed Care – PPO | Attending: Oncology

## 2022-01-16 ENCOUNTER — Other Ambulatory Visit: Payer: Self-pay

## 2022-01-16 DIAGNOSIS — D75839 Thrombocytosis, unspecified: Secondary | ICD-10-CM | POA: Diagnosis present

## 2022-01-16 LAB — IRON AND TIBC
Iron: 125 ug/dL (ref 28–170)
Saturation Ratios: 28 % (ref 10.4–31.8)
TIBC: 452 ug/dL — ABNORMAL HIGH (ref 250–450)
UIBC: 327 ug/dL

## 2022-01-16 LAB — FERRITIN: Ferritin: 22 ng/mL (ref 11–307)

## 2022-01-16 LAB — CBC WITH DIFFERENTIAL/PLATELET
Abs Immature Granulocytes: 0.02 10*3/uL (ref 0.00–0.07)
Basophils Absolute: 0.1 10*3/uL (ref 0.0–0.1)
Basophils Relative: 1 %
Eosinophils Absolute: 0.1 10*3/uL (ref 0.0–0.5)
Eosinophils Relative: 2 %
HCT: 39.5 % (ref 36.0–46.0)
Hemoglobin: 13.1 g/dL (ref 12.0–15.0)
Immature Granulocytes: 0 %
Lymphocytes Relative: 28 %
Lymphs Abs: 1.6 10*3/uL (ref 0.7–4.0)
MCH: 29.8 pg (ref 26.0–34.0)
MCHC: 33.2 g/dL (ref 30.0–36.0)
MCV: 89.8 fL (ref 80.0–100.0)
Monocytes Absolute: 0.6 10*3/uL (ref 0.1–1.0)
Monocytes Relative: 10 %
Neutro Abs: 3.4 10*3/uL (ref 1.7–7.7)
Neutrophils Relative %: 59 %
Platelets: 360 10*3/uL (ref 150–400)
RBC: 4.4 MIL/uL (ref 3.87–5.11)
RDW: 13.4 % (ref 11.5–15.5)
WBC: 5.8 10*3/uL (ref 4.0–10.5)
nRBC: 0 % (ref 0.0–0.2)

## 2022-01-21 ENCOUNTER — Telehealth: Payer: Self-pay

## 2022-01-21 NOTE — Telephone Encounter (Signed)
-----   Message from Luella Cook, RN sent at 01/21/2022  4:06 PM EDT ----- ? ?----- Message ----- ?From: Sindy Guadeloupe, MD ?Sent: 01/17/2022   4:57 PM EDT ?To: Luella Cook, RN ? ?Platelet count is better. Iron deficiency persists. No anemia. Continue po iron for now ? ?

## 2022-01-29 ENCOUNTER — Other Ambulatory Visit: Payer: Self-pay | Admitting: Internal Medicine

## 2022-01-29 DIAGNOSIS — J309 Allergic rhinitis, unspecified: Secondary | ICD-10-CM

## 2022-01-31 ENCOUNTER — Other Ambulatory Visit: Payer: Self-pay

## 2022-01-31 ENCOUNTER — Ambulatory Visit: Payer: BC Managed Care – PPO | Admitting: Internal Medicine

## 2022-01-31 ENCOUNTER — Encounter: Payer: Self-pay | Admitting: Internal Medicine

## 2022-01-31 VITALS — BP 130/90 | HR 98 | Temp 98.4°F | Resp 14 | Ht 67.01 in | Wt 184.6 lb

## 2022-01-31 DIAGNOSIS — E2839 Other primary ovarian failure: Secondary | ICD-10-CM | POA: Diagnosis not present

## 2022-01-31 DIAGNOSIS — M8588 Other specified disorders of bone density and structure, other site: Secondary | ICD-10-CM

## 2022-01-31 DIAGNOSIS — E663 Overweight: Secondary | ICD-10-CM | POA: Diagnosis not present

## 2022-01-31 DIAGNOSIS — R7303 Prediabetes: Secondary | ICD-10-CM | POA: Diagnosis not present

## 2022-01-31 DIAGNOSIS — E785 Hyperlipidemia, unspecified: Secondary | ICD-10-CM | POA: Diagnosis not present

## 2022-01-31 DIAGNOSIS — I10 Essential (primary) hypertension: Secondary | ICD-10-CM

## 2022-01-31 MED ORDER — WEGOVY 2.4 MG/0.75ML ~~LOC~~ SOAJ: 2.4000 mg | SUBCUTANEOUS | 2 refills | Status: DC

## 2022-01-31 MED ORDER — WEGOVY 1.7 MG/0.75ML ~~LOC~~ SOAJ: 1.7000 mg | SUBCUTANEOUS | 0 refills | Status: DC

## 2022-01-31 MED ORDER — METFORMIN HCL ER 500 MG PO TB24: 500.0000 mg | ORAL_TABLET | Freq: Every day | ORAL | 3 refills | Status: DC

## 2022-01-31 MED ORDER — LOSARTAN POTASSIUM-HCTZ 50-12.5 MG PO TABS: 1.0000 | ORAL_TABLET | Freq: Every day | ORAL | 3 refills | Status: DC

## 2022-01-31 MED ORDER — WEGOVY 0.5 MG/0.5ML ~~LOC~~ SOAJ: 0.5000 mg | SUBCUTANEOUS | 0 refills | Status: DC

## 2022-01-31 MED ORDER — WEGOVY 0.25 MG/0.5ML ~~LOC~~ SOAJ: 0.2500 mg | SUBCUTANEOUS | 0 refills | Status: DC

## 2022-01-31 MED ORDER — WEGOVY 1 MG/0.5ML ~~LOC~~ SOAJ: 1.0000 mg | SUBCUTANEOUS | 0 refills | Status: DC

## 2022-01-31 NOTE — Patient Instructions (Addendum)
Calorie Counting for Weight Loss ?Calories are units of energy. Your body needs a certain number of calories from food to keep going throughout the day. When you eat or drink more calories than your body needs, your body stores the extra calories mostly as fat. When you eat or drink fewer calories than your body needs, your body burns fat to get the energy it needs. ?Calorie counting means keeping track of how many calories you eat and drink each day. Calorie counting can be helpful if you need to lose weight. If you eat fewer calories than your body needs, you should lose weight. Ask your health care provider what a healthy weight is for you. ?For calorie counting to work, you will need to eat the right number of calories each day to lose a healthy amount of weight per week. A dietitian can help you figure out how many calories you need in a day and will suggest ways to reach your calorie goal. ?A healthy amount of weight to lose each week is usually 1-2 lb (0.5-0.9 kg). This usually means that your daily calorie intake should be reduced by 500-750 calories. ?Eating 1,200-1,500 calories a day can help most women lose weight. ?Eating 1,500-1,800 calories a day can help most men lose weight. ?What do I need to know about calorie counting? ?Work with your health care provider or dietitian to determine how many calories you should get each day. To meet your daily calorie goal, you will need to: ?Find out how many calories are in each food that you would like to eat. Try to do this before you eat. ?Decide how much of the food you plan to eat. ?Keep a food log. Do this by writing down what you ate and how many calories it had. ?To successfully lose weight, it is important to balance calorie counting with a healthy lifestyle that includes regular activity. ?Where do I find calorie information? ?The number of calories in a food can be found on a Nutrition Facts label. If a food does not have a Nutrition Facts label, try to  look up the calories online or ask your dietitian for help. ?Remember that calories are listed per serving. If you choose to have more than one serving of a food, you will have to multiply the calories per serving by the number of servings you plan to eat. For example, the label on a package of bread might say that a serving size is 1 slice and that there are 90 calories in a serving. If you eat 1 slice, you will have eaten 90 calories. If you eat 2 slices, you will have eaten 180 calories. ?How do I keep a food log? ?After each time that you eat, record the following in your food log as soon as possible: ?What you ate. Be sure to include toppings, sauces, and other extras on the food. ?How much you ate. This can be measured in cups, ounces, or number of items. ?How many calories were in each food and drink. ?The total number of calories in the food you ate. ?Keep your food log near you, such as in a pocket-sized notebook or on an app or website on your mobile phone. Some programs will calculate calories for you and show you how many calories you have left to meet your daily goal. ?What are some portion-control tips? ?Know how many calories are in a serving. This will help you know how many servings you can have of a certain food. ?  Use a measuring cup to measure serving sizes. You could also try weighing out portions on a kitchen scale. With time, you will be able to estimate serving sizes for some foods. ?Take time to put servings of different foods on your favorite plates or in your favorite bowls and cups so you know what a serving looks like. ?Try not to eat straight from a food's packaging, such as from a bag or box. Eating straight from the package makes it hard to see how much you are eating and can lead to overeating. Put the amount you would like to eat in a cup or on a plate to make sure you are eating the right portion. ?Use smaller plates, glasses, and bowls for smaller portions and to prevent  overeating. ?Try not to multitask. For example, avoid watching TV or using your computer while eating. If it is time to eat, sit down at a table and enjoy your food. This will help you recognize when you are full. It will also help you be more mindful of what and how much you are eating. ?What are tips for following this plan? ?Reading food labels ?Check the calorie count compared with the serving size. The serving size may be smaller than what you are used to eating. ?Check the source of the calories. Try to choose foods that are high in protein, fiber, and vitamins, and low in saturated fat, trans fat, and sodium. ?Shopping ?Read nutrition labels while you shop. This will help you make healthy decisions about which foods to buy. ?Pay attention to nutrition labels for low-fat or fat-free foods. These foods sometimes have the same number of calories or more calories than the full-fat versions. They also often have added sugar, starch, or salt to make up for flavor that was removed with the fat. ?Make a grocery list of lower-calorie foods and stick to it. ?Cooking ?Try to cook your favorite foods in a healthier way. For example, try baking instead of frying. ?Use low-fat dairy products. ?Meal planning ?Use more fruits and vegetables. One-half of your plate should be fruits and vegetables. ?Include lean proteins, such as chicken, [place], and fish. ?Lifestyle ?Each week, aim to do one of the following: ?150 minutes of moderate exercise, such as walking. ?75 minutes of vigorous exercise, such as running. ?General information ?Know how many calories are in the foods you eat most often. This will help you calculate calorie counts faster. ?Find a way of tracking calories that works for you. Get creative. Try different apps or programs if writing down calories does not work for you. ?What foods should I eat? ? ?Eat nutritious foods. It is better to have a nutritious, high-calorie food, such as an avocado, than a food with  few nutrients, such as a bag of potato chips. ?Use your calories on foods and drinks that will fill you up and will not leave you hungry soon after eating. ?Examples of foods that fill you up are nuts and nut butters, vegetables, lean proteins, and high-fiber foods such as whole grains. High-fiber foods are foods with more than 5 g of fiber per serving. ?Pay attention to calories in drinks. Low-calorie drinks include water and unsweetened drinks. ?The items listed above may not be a complete list of foods and beverages you can eat. Contact a dietitian for more information. ?What foods should I limit? ?Limit foods or drinks that are not good sources of vitamins, minerals, or protein or that are high in unhealthy fats. These include: ?  Candy. ?Other sweets. ?Sodas, specialty coffee drinks, alcohol, and juice. ?The items listed above may not be a complete list of foods and beverages you should avoid. Contact a dietitian for more information. ?How do I count calories when eating out? ?Pay attention to portions. Often, portions are much larger when eating out. Try these tips to keep portions smaller: ?Consider sharing a meal instead of getting your own. ?If you get your own meal, eat only half of it. Before you start eating, ask for a container and put half of your meal into it. ?When available, consider ordering smaller portions from the menu instead of full portions. ?Pay attention to your food and drink choices. Knowing the way food is cooked and what is included with the meal can help you eat fewer calories. ?If calories are listed on the menu, choose the lower-calorie options. ?Choose dishes that include vegetables, fruits, whole grains, low-fat dairy products, and lean proteins. ?Choose items that are boiled, broiled, grilled, or steamed. Avoid items that are buttered, battered, fried, or served with cream sauce. Items labeled as crispy are usually fried, unless stated otherwise. ?Choose water, low-fat milk,  unsweetened iced tea, or other drinks without added sugar. If you want an alcoholic beverage, choose a lower-calorie option, such as a glass of wine or light beer. ?Ask for dressings, sauces, and syrups on the side. T

## 2022-01-31 NOTE — Progress Notes (Signed)
Chief Complaint  ?Patient presents with  ? Follow-up  ?  Discuss about elevated A1c, has lost less than 1/2 a lb in the past 3 months. Has upcoming endo appt next week. Discuss wt loss medications.   ? ?F/u  ?1. Htn sl elevated on hyzaar 50-12.5 mg qd could not tolerate spironolactone  ?2. Obesity/prediabetes/hld tried adipex in the past cant take due to anxiety/htn did not work. Will try metformin and exercise to lose ? ? ?Review of Systems  ?Constitutional:  Negative for weight loss.  ?HENT:  Negative for hearing loss.   ?Eyes:  Negative for blurred vision.  ?Respiratory:  Negative for shortness of breath.   ?Cardiovascular:  Negative for chest pain.  ?Gastrointestinal:  Negative for abdominal pain and blood in stool.  ?Genitourinary:  Negative for dysuria.  ?Musculoskeletal:  Negative for falls and joint pain.  ?Skin:  Negative for rash.  ?Neurological:  Negative for headaches.  ?Psychiatric/Behavioral:  Negative for depression.   ?Past Medical History:  ?Diagnosis Date  ? ADD (attention deficit disorder)   ? Allergy   ? Anemia   ? Asthma   ? COVID-19   ? Depression   ? Hashimoto's disease   ? Hashimoto's thyroiditis   ? History of chicken pox   ? History of UTI   ? Hyperlipidemia   ? Premature ovarian insufficiency   ? [place] [address] Dr. Hughie Closs   ? Seizures ([place])   ? medication induced wellbutrin  ? ?Past Surgical History:  ?Procedure Laterality Date  ? ABDOMINAL HYSTERECTOMY    ? Dr. Gerarda Fraction 11-04-18  ? DILATION AND CURETTAGE OF UTERUS    ? 2015 and 2018   ? HYSTERECTOMY ABDOMINAL WITH SALPINGECTOMY Bilateral 11/04/2018  ? Procedure: HYSTERECTOMY ABDOMINAL WITH BILATERAL SALPINGECTOMY, Exploratory laparotomy;  Surgeon: Isabel Caprice, MD;  Location: [facility];  Service: Gynecology;  Laterality: Bilateral;  ? MYOMECTOMY    ? 2015   ? ?Family History  ?Problem Relation Age of Onset  ? Rheum arthritis Mother   ? Hashimoto's thyroiditis Mother   ? Hypertension Mother   ? Hyperlipidemia Mother   ? Arthritis Mother   ?  Asthma Mother   ? Depression Mother   ? Kidney disease Mother   ? Miscarriages / [place] Mother   ? Memory loss Mother   ? Diabetes Mellitus II Father   ? Hypothyroidism Father   ? Hypertension Father   ? Coronary artery disease Father   ? Hyperlipidemia Father   ? Arthritis Father   ? COPD Father   ? Depression Father   ? Diabetes Father   ? Heart disease Father   ? Kidney disease Father   ? Bladder Cancer Father   ? Prostate cancer Father   ? Learning disabilities Brother   ? Hypertension Brother   ? Depression Brother   ? Stroke Maternal Grandmother   ? Heart disease Maternal Grandmother   ? Alcohol abuse Maternal Grandfather   ? Early death Maternal Grandfather   ? Breast cancer Paternal Grandmother 16  ? Early death Paternal Grandmother   ? Cancer Paternal Grandmother   ?     breast died   ? Miscarriages / Stillbirths Paternal Grandmother   ? Heart disease Paternal Grandfather   ? ?Social History  ? ?Socioeconomic History  ? Marital status: Married  ?  Spouse name: Not on file  ? Number of children: Not on file  ? Years of education: Not on file  ? Highest education level: Not on  file  ?Occupational History  ? Not on file  ?Tobacco Use  ? Smoking status: Never  ? Smokeless tobacco: Never  ?Vaping Use  ? Vaping Use: Never used  ?Substance and Sexual Activity  ? Alcohol use: No  ? Drug use: No  ? Sexual activity: Yes  ?  Birth control/protection: None  ?Other Topics Concern  ? Not on file  ?Social History Narrative  ? 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 No kids has pets  ? [employer] or rehab  ? ?Social Determinants of Health  ? ?Financial Resource Strain: Not on file  ?Food Insecurity: Not on file  ?Transportation Needs: Not on file  ?Physical Activity: Not on file  ?Stress: Not on file  ?Social Connections: Not on file  ?Intimate Partner Violence: Not on file  ? ?Current Meds  ?Medication Sig  ? albuterol (VENTOLIN HFA) 108 (90 Base) MCG/ACT inhaler Inhale 1-2 puffs into the lungs every 6 (six) hours as needed  for wheezing or shortness of breath.  ? Calcium Carbonate-Vit D-Min (CALTRATE 600+D PLUS PO) Take 4 capsules by mouth daily.  ? Cetirizine HCl (ZYRTEC PO) Take 1 tablet by mouth daily.  ? FLUoxetine (PROZAC) 40 MG capsule Take 40 mg by mouth daily.   ? levothyroxine (SYNTHROID) 125 MCG tablet TAKE 1 TABLET (125 MCG TOTAL) BY MOUTH DAILY. [place]  ? metFORMIN (GLUCOPHAGE-XR) 500 MG 24 hr tablet Take 1 tablet (500 mg total) by mouth daily with breakfast.  ? montelukast (SINGULAIR) 10 MG tablet TAKE 1 TABLET BY MOUTH EVERYDAY AT BEDTIME  ? Semaglutide-Weight Management (WEGOVY) 0.25 MG/0.5ML SOAJ Inject 0.25 mg into the skin once a week.  ? Semaglutide-Weight Management (WEGOVY) 0.5 MG/0.5ML SOAJ Inject 0.5 mg into the skin once a week.  ? Semaglutide-Weight Management (WEGOVY) 1 MG/0.5ML SOAJ Inject 1 mg into the skin once a week.  ? Semaglutide-Weight Management (WEGOVY) 1.7 MG/0.75ML SOAJ Inject 1.7 mg into the skin once a week.  ? Semaglutide-Weight Management (WEGOVY) 2.4 MG/0.75ML SOAJ Inject 2.4 mg into the skin once a week.  ? [DISCONTINUED] losartan-hydrochlorothiazide (HYZAAR) 50-12.5 MG tablet Take 0.5 tablets by mouth daily. Increase to 1 tablet if BP is not <130/<80 after 3 days (Patient taking differently: Take 1 tablet by mouth daily. Increase to 1 tablet if BP is not <130/<80 after 3 days)  ? ?Allergies  ?Allergen Reactions  ? Amlodipine   ?  Swelling ? ?Other reaction(s): Other (See Comments) ?Swelling ?Swelling  ? Bupropion Other (See Comments)  ?  Seizures  ? ?Other reaction(s): Other (See Comments), Other (See Comments), Other (See Comments) ?Seizures  ?seizure ?Seizures   ? Spironolactone   ?  ?eyelid swelling worse at '50mg'$  dose  ? Latex Other (See Comments) and Hives  ?  Itching, sneezing ?Other reaction(s): Other (See Comments) ?Itching, sneezing ?Itching, sneezing ?Itching, sneezing ? ?Itching, sneezing  ? Other Other (See Comments)  ?  Tree pollen  ? ?Other reaction(s): Other  (See Comments), Other (See Comments) ?Tree pollen  ?unknown ?Tree pollen   ? ?Recent Results (from the past 2160 hour(s))  ?IBC + Ferritin     Status: None  ? Collection Time: 11/25/21 10:47 AM  ?Result Value Ref Range  ? Iron 118 42 - 145 ug/dL  ? Transferrin 287.0 212.0 - 360.0 mg/dL  ? Saturation Ratios 29.4 20.0 - 50.0 %  ? Ferritin 21.2 10.0 - 291.0 ng/mL  ? TIBC 401.8 250.0 - 450.0 mcg/dL  ?Vitamin B12  Status: None  ? Collection Time: 11/25/21 10:47 AM  ?Result Value Ref Range  ? Vitamin B-12 677 211 - 911 pg/mL  ?Vitamin D (25 hydroxy)     Status: None  ? Collection Time: 11/25/21 10:47 AM  ?Result Value Ref Range  ? VITD 40.27 30.00 - 100.00 ng/mL  ?Hemoglobin A1c     Status: None  ? Collection Time: 11/25/21 10:47 AM  ?Result Value Ref Range  ? Hgb A1c MFr Bld 6.1 4.6 - 6.5 %  ?  Comment: Glycemic Control Guidelines for People with Diabetes:Non Diabetic:  <6%Goal of Therapy: <7%Additional Action Suggested:  >8%   ?Urinalysis, Routine w reflex microscopic     Status: None  ? Collection Time: 11/25/21 10:47 AM  ?Result Value Ref Range  ? Color, Urine YELLOW YELLOW  ? APPearance CLEAR CLEAR  ? Specific Gravity, Urine 1.011 1.001 - 1.035  ? pH 7.0 5.0 - 8.0  ? Glucose, UA NEGATIVE NEGATIVE  ? Bilirubin Urine NEGATIVE NEGATIVE  ? Ketones, ur NEGATIVE NEGATIVE  ? Hgb urine dipstick NEGATIVE NEGATIVE  ? Protein, ur NEGATIVE NEGATIVE  ? Nitrite NEGATIVE NEGATIVE  ? Leukocytes,Ua NEGATIVE NEGATIVE  ?CBC with Differential/Platelet     Status: Abnormal  ? Collection Time: 11/25/21 10:47 AM  ?Result Value Ref Range  ? WBC 4.0 4.0 - 10.5 K/uL  ? RBC 4.41 3.87 - 5.11 Mil/uL  ? Hemoglobin 13.0 12.0 - 15.0 g/dL  ? HCT 39.3 36.0 - 46.0 %  ? MCV 89.2 78.0 - 100.0 fl  ? MCHC 33.1 30.0 - 36.0 g/dL  ? RDW 13.5 11.5 - 15.5 %  ? Platelets 423.0 (H) 150.0 - 400.0 K/uL  ? Neutrophils Relative % 56.6 43.0 - 77.0 %  ? Lymphocytes Relative 30.2 12.0 - 46.0 %  ? Monocytes Relative 9.5 3.0 - 12.0 %  ? Eosinophils Relative 2.8 0.0 -  5.0 %  ? Basophils Relative 0.9 0.0 - 3.0 %  ? Neutro Abs 2.3 1.4 - 7.7 K/uL  ? Lymphs Abs 1.2 0.7 - 4.0 K/uL  ? Monocytes Absolute 0.4 0.1 - 1.0 K/uL  ? Eosinophils Absolute 0.1 0.0 - 0.7 K/uL  ? Basophils

## 2022-02-05 ENCOUNTER — Encounter: Payer: Self-pay | Admitting: Internal Medicine

## 2022-02-05 DIAGNOSIS — R7303 Prediabetes: Secondary | ICD-10-CM | POA: Insufficient documentation

## 2022-02-10 ENCOUNTER — Telehealth: Payer: Self-pay | Admitting: Internal Medicine

## 2022-02-10 NOTE — Telephone Encounter (Signed)
Prior authorization has been submitted for patient's [facility] ? ?Awaiting approval or denial.  ? ?

## 2022-02-12 ENCOUNTER — Encounter: Payer: Self-pay | Admitting: *Deleted

## 2022-02-12 NOTE — Telephone Encounter (Signed)
Patient notified via mychart

## 2022-02-13 ENCOUNTER — Ambulatory Visit
Admission: RE | Admit: 2022-02-13 | Discharge: 2022-02-13 | Disposition: A | Discharge status: Home / Self Care | Payer: BC Managed Care – PPO | Source: Ambulatory Visit | Attending: Internal Medicine | Admitting: Internal Medicine

## 2022-02-13 DIAGNOSIS — Z1231 Encounter for screening mammogram for malignant neoplasm of breast: Secondary | ICD-10-CM | POA: Diagnosis present

## 2022-02-18 ENCOUNTER — Other Ambulatory Visit: Payer: Self-pay | Admitting: *Deleted

## 2022-02-18 DIAGNOSIS — D75839 Thrombocytosis, unspecified: Secondary | ICD-10-CM

## 2022-02-19 ENCOUNTER — Inpatient Hospital Stay: Payer: BC Managed Care – PPO | Attending: Oncology

## 2022-02-19 ENCOUNTER — Inpatient Hospital Stay (HOSPITAL_BASED_OUTPATIENT_CLINIC_OR_DEPARTMENT_OTHER): Payer: BC Managed Care – PPO | Admitting: Oncology

## 2022-02-19 ENCOUNTER — Encounter: Payer: Self-pay | Admitting: Oncology

## 2022-02-19 VITALS — BP 125/82 | HR 97 | Temp 98.6°F | Resp 16 | Wt 183.0 lb

## 2022-02-19 DIAGNOSIS — D75839 Thrombocytosis, unspecified: Secondary | ICD-10-CM

## 2022-02-19 LAB — CBC WITH DIFFERENTIAL/PLATELET
Abs Immature Granulocytes: 0.02 10*3/uL (ref 0.00–0.07)
Basophils Absolute: 0.1 10*3/uL (ref 0.0–0.1)
Basophils Relative: 1 %
Eosinophils Absolute: 0.2 10*3/uL (ref 0.0–0.5)
Eosinophils Relative: 2 %
HCT: 41.4 % (ref 36.0–46.0)
Hemoglobin: 13.7 g/dL (ref 12.0–15.0)
Immature Granulocytes: 0 %
Lymphocytes Relative: 24 %
Lymphs Abs: 1.5 10*3/uL (ref 0.7–4.0)
MCH: 30.1 pg (ref 26.0–34.0)
MCHC: 33.1 g/dL (ref 30.0–36.0)
MCV: 91 fL (ref 80.0–100.0)
Monocytes Absolute: 0.7 10*3/uL (ref 0.1–1.0)
Monocytes Relative: 10 %
Neutro Abs: 3.9 10*3/uL (ref 1.7–7.7)
Neutrophils Relative %: 63 %
Platelets: 415 10*3/uL — ABNORMAL HIGH (ref 150–400)
RBC: 4.55 MIL/uL (ref 3.87–5.11)
RDW: 13.7 % (ref 11.5–15.5)
WBC: 6.4 10*3/uL (ref 4.0–10.5)
nRBC: 0 % (ref 0.0–0.2)

## 2022-02-19 LAB — SEDIMENTATION RATE: Sed Rate: 8 mm/hr (ref 0–20)

## 2022-02-19 NOTE — Progress Notes (Signed)
? ? ? ?Hematology/Oncology Consult note ?[place]  ?[phone] [phone] ? ?Patient Care Team: ?McLean-Scocuzza, Nino Glow, MD as PCP - General (Internal Medicine) ?Sindy Guadeloupe, MD as Consulting Physician (Hematology and Oncology)  ? ?Name of the patient: Cynthia Burke  ?[id number]  ?Oct 23, 1976  ? ?Date of visit: 02/19/22 ? ?Diagnosis-thrombocytosis likely reactive ? ?Chief complaint/ Reason for visit-routine follow-up of thrombocytosis ? ?Heme/Onc history: Patient is a 46 year old female referred for thrombocytosis.  Her most recent CBC from 11/25/2021 showed a white count of 4, H&H of 13/39.3 and a platelet count of 423.  Her prior platelet count in 2019 was normal at 270.  Platelet counts between October and December 2019 have been normal except for 1 occasion admitted as 409.no prior h/o thrombosis. ? ?Interval history-patient continues to report significant stress and is again tearful during her office visit today.  She denies any suicidal or homicidal ideations.  States that she has an appointment coming up with psychiatry tomorrow.  She has tried multiple antidepression medications but had side effects from them.  Presently she is on Prozac. ? ?ECOG PS- 0 ?Pain scale- 0 ? ? ?Review of systems- Review of Systems  ?Constitutional:  Positive for malaise/fatigue. Negative for chills, fever and weight loss.  ?HENT:  Negative for congestion, ear discharge and nosebleeds.   ?Eyes:  Negative for blurred vision.  ?Respiratory:  Negative for cough, hemoptysis, sputum production, shortness of breath and wheezing.   ?Cardiovascular:  Negative for chest pain, palpitations, orthopnea and claudication.  ?Gastrointestinal:  Negative for abdominal pain, blood in stool, constipation, diarrhea, heartburn, melena, nausea and vomiting.  ?Genitourinary:  Negative for dysuria, flank pain, frequency, hematuria and urgency.  ?Musculoskeletal:  Negative for back pain, joint pain  and myalgias.  ?Skin:  Negative for rash.  ?Neurological:  Negative for dizziness, tingling, focal weakness, seizures, weakness and headaches.  ?Endo/Heme/Allergies:  Does not bruise/bleed easily.  ?Psychiatric/Behavioral:  Positive for depression. Negative for suicidal ideas. The patient is nervous/anxious. The patient does not have insomnia.    ? ?Allergies  ?Allergen Reactions  ? Amlodipine   ?  Swelling ? ?Other reaction(s): Other (See Comments) ?Swelling ?Swelling  ? Bupropion Other (See Comments)  ?  Seizures  ? ?Other reaction(s): Other (See Comments), Other (See Comments), Other (See Comments) ?Seizures  ?seizure ?Seizures   ? Spironolactone   ?  ?eyelid swelling worse at '50mg'$  dose  ? Latex Other (See Comments) and Hives  ?  Itching, sneezing ?Other reaction(s): Other (See Comments) ?Itching, sneezing ?Itching, sneezing ?Itching, sneezing ? ?Itching, sneezing  ? Other Other (See Comments)  ?  Tree pollen  ? ?Other reaction(s): Other (See Comments), Other (See Comments) ?Tree pollen  ?unknown ?Tree pollen   ? ? ? ?Past Medical History:  ?Diagnosis Date  ? ADD (attention deficit disorder)   ? Allergy   ? Anemia   ? Asthma   ? COVID-19   ? Depression   ? Hashimoto's disease   ? Hashimoto's thyroiditis   ? History of chicken pox   ? History of UTI   ? Hyperlipidemia   ? Premature ovarian insufficiency   ? [place] [address] Dr. Hughie Closs   ? Seizures ([place])   ? medication induced wellbutrin  ? ? ? ?Past Surgical History:  ?Procedure Laterality Date  ? ABDOMINAL HYSTERECTOMY    ? Dr. Gerarda Fraction 11-04-18  ? DILATION AND CURETTAGE OF UTERUS    ? 2015 and 2018   ? HYSTERECTOMY ABDOMINAL WITH  SALPINGECTOMY Bilateral 11/04/2018  ? Procedure: HYSTERECTOMY ABDOMINAL WITH BILATERAL SALPINGECTOMY, Exploratory laparotomy;  Surgeon: Isabel Caprice, MD;  Location: [facility];  Service: Gynecology;  Laterality: Bilateral;  ? MYOMECTOMY    ? 2015   ? ? ?Social History  ? ?Socioeconomic History  ? Marital status: Married  ?  Spouse name: Not on  file  ? Number of children: Not on file  ? Years of education: Not on file  ? Highest education level: Not on file  ?Occupational History  ? Not on file  ?Tobacco Use  ? Smoking status: Never  ? Smokeless tobacco: Never  ?Vaping Use  ? Vaping Use: Never used  ?Substance and Sexual Activity  ? Alcohol use: No  ? Drug use: No  ? Sexual activity: Yes  ?  Birth control/protection: None  ?Other Topics Concern  ? Not on file  ?Social History Narrative  ? 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 No kids has pets  ? [employer] or rehab  ? ?Social Determinants of Health  ? ?Financial Resource Strain: Not on file  ?Food Insecurity: Not on file  ?Transportation Needs: Not on file  ?Physical Activity: Not on file  ?Stress: Not on file  ?Social Connections: Not on file  ?Intimate Partner Violence: Not on file  ? ? ?Family History  ?Problem Relation Age of Onset  ? Rheum arthritis Mother   ? Hashimoto's thyroiditis Mother   ? Hypertension Mother   ? Hyperlipidemia Mother   ? Arthritis Mother   ? Asthma Mother   ? Depression Mother   ? Kidney disease Mother   ? Miscarriages / [place] Mother   ? Memory loss Mother   ? Diabetes Mellitus II Father   ? Hypothyroidism Father   ? Hypertension Father   ? Coronary artery disease Father   ? Hyperlipidemia Father   ? Arthritis Father   ? COPD Father   ? Depression Father   ? Diabetes Father   ? Heart disease Father   ? Kidney disease Father   ? Bladder Cancer Father   ? Prostate cancer Father   ? Learning disabilities Brother   ? Hypertension Brother   ? Depression Brother   ? Stroke Maternal Grandmother   ? Heart disease Maternal Grandmother   ? Alcohol abuse Maternal Grandfather   ? Early death Maternal Grandfather   ? Breast cancer Paternal Grandmother 75  ? Early death Paternal Grandmother   ? Cancer Paternal Grandmother   ?     breast died   ? Miscarriages / Stillbirths Paternal Grandmother   ? Heart disease Paternal Grandfather   ? ? ? ?Current Outpatient Medications:  ?  albuterol  (VENTOLIN HFA) 108 (90 Base) MCG/ACT inhaler, Inhale 1-2 puffs into the lungs every 6 (six) hours as needed for wheezing or shortness of breath., Disp: 1 g, Rfl: 12 ?  Calcium Carbonate-Vit D-Min (CALTRATE 600+D PLUS PO), Take 4 capsules by mouth daily., Disp: , Rfl:  ?  Cetirizine HCl (ZYRTEC PO), Take 1 tablet by mouth daily., Disp: , Rfl:  ?  FLUoxetine (PROZAC) 40 MG capsule, Take 40 mg by mouth daily. , Disp: , Rfl:  ?  losartan-hydrochlorothiazide (HYZAAR) 50-12.5 MG tablet, Take 1 tablet by mouth daily. In am. D/c 0.5 tablet daily, Disp: 90 tablet, Rfl: 3 ?  metFORMIN (GLUCOPHAGE-XR) 500 MG 24 hr tablet, Take 1 tablet (500 mg total) by mouth daily with breakfast., Disp: 90 tablet, Rfl: 3 ?  montelukast (SINGULAIR)  10 MG tablet, TAKE 1 TABLET BY MOUTH EVERYDAY AT BEDTIME, Disp: 90 tablet, Rfl: 1 ?  thyroid (ARMOUR) 90 MG tablet, TAKE 90 MG (ONE TABLET) SIX DAYS PER WEEK ONLY., Disp: , Rfl:  ?  amphetamine-dextroamphetamine (ADDERALL) 20 MG tablet, Take 20 mg by mouth in the morning, at noon, and at bedtime. (Patient not taking: Reported on 01/31/2022), Disp: , Rfl:  ?  estradiol (ESTRACE) 1 MG tablet, Take 1 mg by mouth daily. (Patient not taking: Reported on 12/04/2021), Disp: , Rfl:  ?  fluocinonide cream (LIDEX) 0.27 %, Apply 1 application topically 2 (two) times daily. (Patient not taking: Reported on 01/31/2022), Disp: 60 g, Rfl: 0 ? ?Physical exam:  ?Vitals:  ? 02/19/22 1043  ?BP: 125/82  ?Pulse: 97  ?Resp: 16  ?Temp: 98.6 ?F (37 ?C)  ?TempSrc: Tympanic  ?SpO2: 99%  ?Weight: 183 lb (83 kg)  ? ?Physical Exam ?Constitutional:   ?   Comments: Tearful and anxious  ?Cardiovascular:  ?   Rate and Rhythm: Normal rate and regular rhythm.  ?   Heart sounds: Normal heart sounds.  ?Pulmonary:  ?   Effort: Pulmonary effort is normal.  ?   Breath sounds: Normal breath sounds.  ?Skin: ?   General: Skin is warm and dry.  ?Neurological:  ?   Mental Status: She is oriented to person, place, and time.  ?  ? ? ?  Latest Ref  Rng & Units 11/25/2021  ? 10:47 AM  ?CMP  ?Glucose 70 - 99 mg/dL 98    ?BUN 6 - 23 mg/dL 15    ?Creatinine 0.40 - 1.20 mg/dL 0.84    ?Sodium 135 - 145 mEq/L 135    ?Potassium 3.5 - 5.1 mEq/L 3.8    ?Chloride 96

## 2022-02-19 NOTE — Progress Notes (Signed)
Pt very stressed, exhausted and tearful.  Very discouraged over inability to lose weight.  Pt is also caregiver for her parents.   ?

## 2022-04-16 ENCOUNTER — Encounter: Payer: Self-pay | Admitting: Internal Medicine

## 2022-04-21 ENCOUNTER — Ambulatory Visit
Admission: RE | Admit: 2022-04-21 | Discharge: 2022-04-21 | Disposition: A | Discharge status: Home / Self Care | Payer: BC Managed Care – PPO | Source: Ambulatory Visit | Attending: Internal Medicine | Admitting: Internal Medicine

## 2022-04-21 DIAGNOSIS — M8588 Other specified disorders of bone density and structure, other site: Secondary | ICD-10-CM | POA: Diagnosis not present

## 2022-04-23 ENCOUNTER — Encounter: Payer: Self-pay | Admitting: Internal Medicine

## 2022-05-08 ENCOUNTER — Encounter: Payer: Self-pay | Admitting: Internal Medicine

## 2022-05-09 ENCOUNTER — Telehealth: Payer: BC Managed Care – PPO | Admitting: Family Medicine

## 2022-05-09 DIAGNOSIS — U071 COVID-19: Secondary | ICD-10-CM | POA: Diagnosis not present

## 2022-05-09 MED ORDER — MOLNUPIRAVIR EUA 200MG CAPSULE: 4.0000 | ORAL_CAPSULE | Freq: Two times a day (BID) | ORAL | 0 refills | Status: AC

## 2022-05-09 MED ORDER — BENZONATATE 100 MG PO CAPS: 100.0000 mg | ORAL_CAPSULE | Freq: Two times a day (BID) | ORAL | 0 refills | Status: DC | PRN

## 2022-05-09 NOTE — Patient Instructions (Addendum)
    These are over the counter medication options:  Mucinex dm green label for cough or robitussin DM  Multivitamin or below vitamins  Vitamin C 1000 mg daily.  Vitamin D3 4000 Iu (units) daily.  Zinc 100 mg daily.  Quercetin 250-500 mg 2 times per day   Elderberry  Oil of oregano  cepacol or chloroseptic spray Warm salt water gargles +hydrogen peroxide Sugar free cough drops  Warm tea with honey and lemon  Hydration  Try to eat though you dont feel like it   Tylenol or Advil  Nasal saline and Flonase 2 sprays nasal congestion  If sneezing/runny nose over the counter allergy pill claritin,allegra, zyrtec, xyzal Quarantine x 10-14 days 14 days preferred   Monitor pulse oximeter, buy from [place] if oxygen is less than 90 please go to the hospital.         If worsening, go to hospital or [facility] clinic Urgent care for further treatment.

## 2022-05-09 NOTE — Progress Notes (Signed)
Virtual Visit Consent   Cynthia Burke, you are scheduled for a virtual visit with a [facility] provider today. Just as with appointments in the office, your consent must be obtained to participate. Your consent will be active for this visit and any virtual visit you may have with one of our providers in the next 365 days. If you have a MyChart account, a copy of this consent can be sent to you electronically.  As this is a virtual visit, video technology does not allow for your provider to perform a traditional examination. This may limit your provider's ability to fully assess your condition. If your provider identifies any concerns that need to be evaluated in person or the need to arrange testing (such as labs, EKG, etc.), we will make arrangements to do so. Although advances in technology are sophisticated, we cannot ensure that it will always work on either your end or our end. If the connection with a video visit is poor, the visit may have to be switched to a telephone visit. With either a video or telephone visit, we are not always able to ensure that we have a secure connection.  By engaging in this virtual visit, you consent to the provision of healthcare and authorize for your insurance to be billed (if applicable) for the services provided during this visit. Depending on your insurance coverage, you may receive a charge related to this service.  I need to obtain your verbal consent now. Are you willing to proceed with your visit today? Cynthia Burke has provided verbal consent on 05/09/2022 for a virtual visit (video or telephone). Perlie Mayo, NP  Date: 05/09/2022 9:58 AM  Virtual Visit via Video Note   I, Perlie Mayo, connected with  Cynthia Burke  ([id number], 01-06-1976) on 05/09/22 at 10:00 AM EDT by a video-enabled telemedicine application and verified that I am speaking with the correct person using two identifiers.  Location: Patient: Virtual  Visit Location Patient: Home Provider: Virtual Visit Location Provider: Home Office   I discussed the limitations of evaluation and management by telemedicine and the availability of in person appointments. The patient expressed understanding and agreed to proceed.    History of Present Illness: Cynthia Burke is a 46 y.o. who identifies as a female who was assigned female at birth, and is being seen today for COVID. Recently traveled to [place]. Onset of symptoms started this Monday. Slept all day, then work up on Tuesday and felt bad while driving back home. Wednesday notable worse and prompted her to test-HT was positive. Started experiencing joint pain. Now has cough, fever, chills, achy, headache, sore throat, ear pain. Starting to have shortness of breath, oxygen pulse ox at home was 99%. Is using rescue currently to help with shortness of breath. T max is- 99.0  currently 98.8 Has tried:  Denies chest pain.  Problems:  Patient Active Problem List   Diagnosis Date Noted   Prediabetes 02/05/2022   Stress 10/31/2021   Depression, recurrent ([place]) 10/31/2021   Osteopenia 04/12/2020   Arthralgia 03/26/2020   Need for hepatitis B screening test 03/26/2020   Acne vulgaris 03/08/2020   Rash 03/08/2020   Essential hypertension 12/08/2019   Allergic rhinitis 12/08/2019   Irritable bowel syndrome with constipation 06/03/2019   Annual physical exam 06/03/2019   Anemia 12/21/2018   Chronic constipation 12/03/2018   Hypothyroidism 09/02/2018   Overweight (BMI 25.0-29.9) 09/02/2018   ADHD 09/02/2018   Anxiety and depression 09/02/2018  Asthma 09/02/2018   HLD (hyperlipidemia) 09/02/2018   Hashimoto's thyroiditis 09/02/2018   Allergies 09/02/2018   Thumb pain, left 09/02/2018   Premature ovarian failure 09/02/2018   Hypothyroidism due to Hashimoto's thyroiditis 12/29/2016    Allergies:  Allergies  Allergen Reactions   Amlodipine     Swelling  Other reaction(s): Other  (See Comments) Swelling Swelling   Bupropion Other (See Comments)    Seizures   Other reaction(s): Other (See Comments), Other (See Comments), Other (See Comments) Seizures  seizure Seizures    Spironolactone     ?eyelid swelling worse at '50mg'$  dose   Latex Other (See Comments) and Hives    Itching, sneezing Other reaction(s): Other (See Comments) Itching, sneezing Itching, sneezing Itching, sneezing  Itching, sneezing   Other Other (See Comments)    Tree pollen   Other reaction(s): Other (See Comments), Other (See Comments) Tree pollen  unknown Tree pollen    Medications:  Current Outpatient Medications:    albuterol (VENTOLIN HFA) 108 (90 Base) MCG/ACT inhaler, Inhale 1-2 puffs into the lungs every 6 (six) hours as needed for wheezing or shortness of breath., Disp: 1 g, Rfl: 12   amphetamine-dextroamphetamine (ADDERALL) 20 MG tablet, Take 20 mg by mouth in the morning, at noon, and at bedtime. (Patient not taking: Reported on 01/31/2022), Disp: , Rfl:    Calcium Carbonate-Vit D-Min (CALTRATE 600+D PLUS PO), Take 4 capsules by mouth daily., Disp: , Rfl:    Cetirizine HCl (ZYRTEC PO), Take 1 tablet by mouth daily., Disp: , Rfl:    estradiol (ESTRACE) 1 MG tablet, Take 1 mg by mouth daily. (Patient not taking: Reported on 12/04/2021), Disp: , Rfl:    fluocinonide cream (LIDEX) 3.41 %, Apply 1 application topically 2 (two) times daily. (Patient not taking: Reported on 01/31/2022), Disp: 60 g, Rfl: 0   FLUoxetine (PROZAC) 40 MG capsule, Take 40 mg by mouth daily. , Disp: , Rfl:    losartan-hydrochlorothiazide (HYZAAR) 50-12.5 MG tablet, Take 1 tablet by mouth daily. In am. D/c 0.5 tablet daily, Disp: 90 tablet, Rfl: 3   metFORMIN (GLUCOPHAGE-XR) 500 MG 24 hr tablet, Take 1 tablet (500 mg total) by mouth daily with breakfast., Disp: 90 tablet, Rfl: 3   montelukast (SINGULAIR) 10 MG tablet, TAKE 1 TABLET BY MOUTH EVERYDAY AT BEDTIME, Disp: 90 tablet, Rfl: 1   thyroid (ARMOUR) 90 MG  tablet, TAKE 90 MG (ONE TABLET) SIX DAYS PER WEEK ONLY., Disp: , Rfl:   Observations/Objective: Patient is well-developed, well-nourished in no acute distress.  Resting comfortably  at home.  Head is normocephalic, atraumatic.  No labored breathing.  Speech is clear and coherent with logical content.  Patient is alert and oriented at baseline.  Nasal tone, and voice changes.  Assessment and Plan: 1. COVID-19   - molnupiravir EUA (LAGEVRIO) 200 mg CAPS capsule; Take 4 capsules (800 mg total) by mouth 2 (two) times daily for 5 days.  Dispense: 40 capsule; Refill: 0 - benzonatate (TESSALON) 100 MG capsule; Take 1 capsule (100 mg total) by mouth 2 (two) times daily as needed for cough.  Dispense: 20 capsule; Refill: 0  -rest -hydration -OTC and symptom management reviewed in addition to in person Eval if worsen or not improving.  Patient acknowledged agreement and understanding of the plan.   Reviewed side effects, risks and benefits of medication.    Past Medical, Surgical, Social History, Allergies, and Medications have been Reviewed.    Follow Up Instructions: I discussed the assessment and treatment plan  with the patient. The patient was provided an opportunity to ask questions and all were answered. The patient agreed with the plan and demonstrated an understanding of the instructions.  A copy of instructions were sent to the patient via MyChart unless otherwise noted below.    The patient was advised to call back or seek an in-person evaluation if the symptoms worsen or if the condition fails to improve as anticipated.  Time:  I spent 15 minutes with the patient via telehealth technology discussing the above problems/concerns.    Perlie Mayo, NP'

## 2022-05-15 ENCOUNTER — Ambulatory Visit: Payer: BC Managed Care – PPO | Admitting: Internal Medicine

## 2022-06-23 ENCOUNTER — Inpatient Hospital Stay: Payer: BC Managed Care – PPO | Attending: Oncology

## 2022-06-23 DIAGNOSIS — D75839 Thrombocytosis, unspecified: Secondary | ICD-10-CM | POA: Insufficient documentation

## 2022-06-23 LAB — CBC WITH DIFFERENTIAL/PLATELET
Abs Immature Granulocytes: 0.01 10*3/uL (ref 0.00–0.07)
Basophils Absolute: 0.1 10*3/uL (ref 0.0–0.1)
Basophils Relative: 1 %
Eosinophils Absolute: 0.1 10*3/uL (ref 0.0–0.5)
Eosinophils Relative: 1 %
HCT: 39 % (ref 36.0–46.0)
Hemoglobin: 13 g/dL (ref 12.0–15.0)
Immature Granulocytes: 0 %
Lymphocytes Relative: 26 %
Lymphs Abs: 1.7 10*3/uL (ref 0.7–4.0)
MCH: 29.6 pg (ref 26.0–34.0)
MCHC: 33.3 g/dL (ref 30.0–36.0)
MCV: 88.8 fL (ref 80.0–100.0)
Monocytes Absolute: 0.7 10*3/uL (ref 0.1–1.0)
Monocytes Relative: 11 %
Neutro Abs: 3.8 10*3/uL (ref 1.7–7.7)
Neutrophils Relative %: 61 %
Platelets: 372 10*3/uL (ref 150–400)
RBC: 4.39 MIL/uL (ref 3.87–5.11)
RDW: 13.9 % (ref 11.5–15.5)
WBC: 6.3 10*3/uL (ref 4.0–10.5)
nRBC: 0 % (ref 0.0–0.2)

## 2022-06-23 LAB — FERRITIN: Ferritin: 25 ng/mL (ref 11–307)

## 2022-06-24 ENCOUNTER — Other Ambulatory Visit: Payer: Self-pay | Admitting: Internal Medicine

## 2022-06-24 DIAGNOSIS — L309 Dermatitis, unspecified: Secondary | ICD-10-CM

## 2022-06-25 MED ORDER — FLUOCINONIDE 0.05 % EX CREA: 1.0000 | TOPICAL_CREAM | Freq: Two times a day (BID) | CUTANEOUS | 0 refills | Status: DC

## 2022-07-04 ENCOUNTER — Encounter: Payer: Self-pay | Admitting: Internal Medicine

## 2022-07-04 ENCOUNTER — Ambulatory Visit: Payer: BC Managed Care – PPO | Admitting: Internal Medicine

## 2022-07-04 VITALS — BP 124/64 | HR 92 | Temp 98.7°F | Ht 67.01 in | Wt 182.0 lb

## 2022-07-04 DIAGNOSIS — M2550 Pain in unspecified joint: Secondary | ICD-10-CM

## 2022-07-04 DIAGNOSIS — J309 Allergic rhinitis, unspecified: Secondary | ICD-10-CM

## 2022-07-04 DIAGNOSIS — Z1231 Encounter for screening mammogram for malignant neoplasm of breast: Secondary | ICD-10-CM

## 2022-07-04 DIAGNOSIS — Z23 Encounter for immunization: Secondary | ICD-10-CM | POA: Diagnosis not present

## 2022-07-04 DIAGNOSIS — E785 Hyperlipidemia, unspecified: Secondary | ICD-10-CM

## 2022-07-04 DIAGNOSIS — L309 Dermatitis, unspecified: Secondary | ICD-10-CM | POA: Diagnosis not present

## 2022-07-04 DIAGNOSIS — E663 Overweight: Secondary | ICD-10-CM

## 2022-07-04 DIAGNOSIS — Z111 Encounter for screening for respiratory tuberculosis: Secondary | ICD-10-CM

## 2022-07-04 DIAGNOSIS — Z6828 Body mass index (BMI) 28.0-28.9, adult: Secondary | ICD-10-CM

## 2022-07-04 DIAGNOSIS — R7303 Prediabetes: Secondary | ICD-10-CM

## 2022-07-04 DIAGNOSIS — J4520 Mild intermittent asthma, uncomplicated: Secondary | ICD-10-CM

## 2022-07-04 DIAGNOSIS — I10 Essential (primary) hypertension: Secondary | ICD-10-CM | POA: Insufficient documentation

## 2022-07-04 DIAGNOSIS — R21 Rash and other nonspecific skin eruption: Secondary | ICD-10-CM | POA: Diagnosis not present

## 2022-07-04 HISTORY — DX: Body mass index (BMI) 28.0-28.9, adult: Z68.28

## 2022-07-04 MED ORDER — WEGOVY 0.25 MG/0.5ML ~~LOC~~ SOAJ: 0.2500 mg | SUBCUTANEOUS | 0 refills | Status: DC

## 2022-07-04 MED ORDER — WEGOVY 2.4 MG/0.75ML ~~LOC~~ SOAJ: 2.4000 mg | SUBCUTANEOUS | 5 refills | Status: DC

## 2022-07-04 MED ORDER — WEGOVY 0.5 MG/0.5ML ~~LOC~~ SOAJ: 0.5000 mg | SUBCUTANEOUS | 0 refills | Status: DC

## 2022-07-04 MED ORDER — WEGOVY 1.7 MG/0.75ML ~~LOC~~ SOAJ: 1.7000 mg | SUBCUTANEOUS | 0 refills | Status: DC

## 2022-07-04 MED ORDER — METFORMIN HCL ER 500 MG PO TB24: 1000.0000 mg | ORAL_TABLET | Freq: Every day | ORAL | 3 refills | Status: DC

## 2022-07-04 MED ORDER — WEGOVY 1 MG/0.5ML ~~LOC~~ SOAJ: 1.0000 mg | SUBCUTANEOUS | 0 refills | Status: DC

## 2022-07-04 MED ORDER — FLUOCINONIDE 0.05 % EX CREA: 1.0000 | TOPICAL_CREAM | Freq: Two times a day (BID) | CUTANEOUS | 2 refills | Status: DC

## 2022-07-04 NOTE — Progress Notes (Unsigned)
Chief Complaint  Patient presents with   Follow-up   HPI ROS Past Medical History:  Diagnosis Date   ADD (attention deficit disorder)    Allergy    Anemia    Asthma    COVID-19    Depression    Hashimoto's disease    Hashimoto's thyroiditis    History of chicken pox    History of UTI    Hyperlipidemia    Premature ovarian insufficiency    [facility] Dr. Hughie Closs    Seizures [facility])    medication induced wellbutrin   Past Surgical History:  Procedure Laterality Date   ABDOMINAL HYSTERECTOMY     Dr. Gerarda Fraction 11-04-18   [place] OF UTERUS     2015 and 2018    HYSTERECTOMY ABDOMINAL WITH SALPINGECTOMY Bilateral 11/04/2018   Procedure: HYSTERECTOMY ABDOMINAL WITH BILATERAL SALPINGECTOMY, Exploratory laparotomy;  Surgeon: Isabel Caprice, MD;  Location: [facility];  Service: Gynecology;  Laterality: Bilateral;   MYOMECTOMY     2015    Family History  Problem Relation Age of Onset   Rheum arthritis Mother    Hashimoto's thyroiditis Mother    Hypertension Mother    Hyperlipidemia Mother    Arthritis Mother    Asthma Mother    Depression Mother    Kidney disease Mother    67 / [place] Mother    Memory loss Mother    Diabetes Mellitus II Father    Hypothyroidism Father    Hypertension Father    Coronary artery disease Father    Hyperlipidemia Father    Arthritis Father    COPD Father    Depression Father    Diabetes Father    Heart disease Father    Kidney disease Father    Bladder Cancer Father    Prostate cancer Father    Learning disabilities Brother    Hypertension Brother    Depression Brother    Stroke Maternal Grandmother    Heart disease Maternal Grandmother    Alcohol abuse Maternal Grandfather    Early death Maternal Grandfather    Breast cancer Paternal Grandmother 79   Early death Paternal Grandmother    Cancer Paternal Grandmother        breast died    48 / Stillbirths Paternal Grandmother    Heart disease  Paternal Grandfather    Social History   Socioeconomic History   Marital status: Married    Spouse name: Not on file   Number of children: Not on file   Years of education: Not on file   Highest education level: Not on file  Occupational History   Not on file  Tobacco Use   Smoking status: Never   Smokeless tobacco: Never  Vaping Use   Vaping Use: Never used  Substance and Sexual Activity   Alcohol use: No   Drug use: No   Sexual activity: Yes    Birth control/protection: None  Other Topics Concern   Not on file  Social History Narrative   Married    PT   No kids has pets   White Psychologist, prison and probation services or rehab   Social Determinants of Radio broadcast assistant Strain: Not on file  Food Insecurity: Not on file  Transportation Needs: Not on file  Physical Activity: Not on file  Stress: Not on file  Social Connections: Not on file  Intimate Partner Violence: Not on file   Current Meds  Medication Sig   albuterol (VENTOLIN HFA) 108 (90  Base) MCG/ACT inhaler Inhale 1-2 puffs into the lungs every 6 (six) hours as needed for wheezing or shortness of breath.   Calcium Carbonate-Vit D-Min (CALTRATE 600+D PLUS PO) Take 4 capsules by mouth daily.   Cetirizine HCl (ZYRTEC PO) Take 1 tablet by mouth daily.   estradiol (ESTRACE) 1 MG tablet Take 1 mg by mouth daily.   fluocinonide cream (LIDEX) 5.70 % Apply 1 Application topically 2 (two) times daily.   FLUoxetine (PROZAC) 40 MG capsule Take 40 mg by mouth daily.    losartan-hydrochlorothiazide (HYZAAR) 50-12.5 MG tablet Take 1 tablet by mouth daily. In am. D/c 0.5 tablet daily   metFORMIN (GLUCOPHAGE-XR) 500 MG 24 hr tablet Take 1 tablet (500 mg total) by mouth daily with breakfast.   montelukast (SINGULAIR) 10 MG tablet TAKE 1 TABLET BY MOUTH EVERYDAY AT BEDTIME   MYDAYIS 50 MG CP24 Take 1 capsule by mouth every morning.   thyroid (ARMOUR) 90 MG tablet TAKE 90 MG (ONE TABLET) SIX DAYS PER WEEK ONLY.   Allergies  Allergen  Reactions   Amlodipine     Swelling  Other reaction(s): Other (See Comments) Swelling Swelling   Bupropion Other (See Comments)    Seizures   Other reaction(s): Other (See Comments), Other (See Comments), Other (See Comments) Seizures  seizure Seizures    Spironolactone     ?eyelid swelling worse at '50mg'$  dose   Latex Other (See Comments) and Hives    Itching, sneezing Other reaction(s): Other (See Comments) Itching, sneezing Itching, sneezing Itching, sneezing  Itching, sneezing   Other Other (See Comments)    Tree pollen   Other reaction(s): Other (See Comments), Other (See Comments) Tree pollen  unknown Tree pollen    Recent Results (from the past 2160 hour(s))  Ferritin     Status: None   Collection Time: 06/23/22  2:03 PM  Result Value Ref Range   Ferritin 25 11 - 307 ng/mL    Comment: Performed at [facility], [address] [place], [place] [address]  CBC with Differential     Status: None   Collection Time: 06/23/22  2:03 PM  Result Value Ref Range   WBC 6.3 4.0 - 10.5 K/uL   RBC 4.39 3.87 - 5.11 MIL/uL   Hemoglobin 13.0 12.0 - 15.0 g/dL   HCT 39.0 36.0 - 46.0 %   MCV 88.8 80.0 - 100.0 fL   MCH 29.6 26.0 - 34.0 pg   MCHC 33.3 30.0 - 36.0 g/dL   RDW 13.9 11.5 - 15.5 %   Platelets 372 150 - 400 K/uL   nRBC 0.0 0.0 - 0.2 %   Neutrophils Relative % 61 %   Neutro Abs 3.8 1.7 - 7.7 K/uL   Lymphocytes Relative 26 %   Lymphs Abs 1.7 0.7 - 4.0 K/uL   Monocytes Relative 11 %   Monocytes Absolute 0.7 0.1 - 1.0 K/uL   Eosinophils Relative 1 %   Eosinophils Absolute 0.1 0.0 - 0.5 K/uL   Basophils Relative 1 %   Basophils Absolute 0.1 0.0 - 0.1 K/uL   Immature Granulocytes 0 %   Abs Immature Granulocytes 0.01 0.00 - 0.07 K/uL    Comment: Performed at [facility], [address] [place], [place] [address]   Objective  Body mass index is 28.5 kg/m. Wt Readings from Last 3 Encounters:  07/04/22 182 lb (82.6 kg)  02/19/22 183 lb (83 kg)   01/31/22 184 lb 9.6 oz (83.7 kg)   Temp Readings from  Last 3 Encounters:  07/04/22 98.7 F (37.1 C) (Oral)  02/19/22 98.6 F (37 C) (Tympanic)  01/31/22 98.4 F (36.9 C) (Oral)   BP Readings from Last 3 Encounters:  07/04/22 124/64  02/19/22 125/82  01/31/22 130/90   Pulse Readings from Last 3 Encounters:  07/04/22 92  02/19/22 97  01/31/22 98    Physical Exam  Assessment  Plan  No diagnosis found.  HM  Flu shot utd Tdap utd 2/2 pfizier had and 1 moderna    Sch 02/13/22 mammo [place]  Referred DEXA 04/21/22 normal   Never smoker    Colonoscopy due age 63    Pap 04/16/17 neg pap neg HPV [facility] s/p hysterectomy 11/04/18 with uterus cervix and b/l fallopian tubes removed  Wants to go to [facility] Dr. Royston Sinner    Right thyroid FNA 23/76/28 benign follicular cells, rare oncocytic metaplastic cells and histiocytes consistent with MNG (benign thyriod nodules) neg malignancy. Thyroid US 09/12/13 thyroiditis, solitary nodule right thyroid increased in size  -f/u [facility] endocrine Dr. Gabriel Carina tsh 10/28/19 1.819 Psych Dr. Diamantina Monks Dr. Evorn Gong  Rheum Dr. Meda Coffee Endocrine Dr. Gabriel Carina appt 02/06/22  Derm Dr. Evorn Gong   Provider: Dr. Olivia Mackie McLean-Scocuzza-Internal Medicine

## 2022-07-04 NOTE — Patient Instructions (Addendum)
Dr. Volanda Napoleon   Liraglutide Injection (Weight Management) What is this medication? LIRAGLUTIDE (LIR a GLOO tide) promotes weight loss. It may also be used to maintain weight loss. It works by decreasing appetite. Changes to diet and exercise are often combined with this medication. This medicine may be used for other purposes; ask your health care provider or pharmacist if you have questions. COMMON BRAND NAME(S): Saxenda What should I tell my care team before I take this medication? They need to know if you have any of these conditions: Endocrine tumors (MEN 2) or if someone in your family had these tumors Gallbladder disease High cholesterol History of alcohol abuse problem History of pancreatitis Kidney disease or if you are on dialysis Liver disease Previous swelling of the tongue, face, or lips with difficulty breathing, difficulty swallowing, hoarseness, or tightening of the throat Stomach problems Suicidal thoughts, plans, or attempt; a previous suicide attempt by you or a family member Thyroid cancer or if someone in your family had thyroid cancer An unusual or allergic reaction to liraglutide, other medications, foods, dyes, or preservatives Pregnant or trying to get pregnant Breast-feeding How should I use this medication? This medication is for injection under the skin of your upper leg, stomach area, or upper arm. You will be taught how to prepare and give this medication. Use exactly as directed. Take your medication at regular intervals. Do not take it more often than directed. This medication comes with INSTRUCTIONS FOR USE. Ask your pharmacist for directions on how to use this medication. Read the information carefully. Talk to your pharmacist or care team if you have questions. It is important that you put your used needles and syringes in a special sharps container. Do not put them in a trash can. If you do not have a sharps container, call your pharmacist or care team to get  one. A special MedGuide will be given to you by the pharmacist with each prescription and refill. Be sure to read this information carefully each time. Talk to your care team about the use of this medication in children. While it may be prescribed for children as young as 68 years of age for selected conditions, precautions do apply. Overdosage: If you think you have taken too much of this medicine contact a poison control center or emergency room at once. NOTE: This medicine is only for you. Do not share this medicine with others. What if I miss a dose? If you miss a dose, take it as soon as you can. If it is almost time for your next dose, take only that dose. Do not take double or extra doses. If you miss your dose for 3 days or more, call your care team to talk about how to restart this medicine. What may interact with this medication? Insulin and other medications for diabetes This list may not describe all possible interactions. Give your health care provider a list of all the medicines, herbs, non-prescription drugs, or dietary supplements you use. Also tell them if you smoke, drink alcohol, or use illegal drugs. Some items may interact with your medicine. What should I watch for while using this medication? Visit your care team for regular checks on your progress. Drink plenty of fluids while taking this medication. Check with your care team if you get an attack of severe diarrhea, nausea, and vomiting. The loss of too much body fluid can make it dangerous for you to take this medication. This medication may affect blood sugar levels. Ask  your care team if changes in diet or medications are needed if you have diabetes. Patients and their families should watch out for worsening depression or thoughts of suicide. Also watch out for sudden changes in feelings such as feeling anxious, agitated, panicky, irritable, hostile, aggressive, impulsive, severely restless, overly excited and hyperactive, or  not being able to sleep. If this happens, especially at the beginning of treatment or after a change in dose, call your care team. Women should inform their care team if they wish to become pregnant or think they might be pregnant. Losing weight while pregnant is not advised and may cause harm to the unborn child. Talk to your care team for more information. What side effects may I notice from receiving this medication? Side effects that you should report to your care team as soon as possible: Allergic reactions or angioedema--skin rash, itching, hives, swelling of the face, eyes, lips, tongue, arms, or legs, trouble swallowing or breathing Fast or irregular heartbeat Gallbladder problems--severe stomach pain, nausea, vomiting, fever Kidney injury--decrease in the amount of urine, swelling of the ankles, hands, or feet Pancreatitis--severe stomach pain that spreads to your back or gets worse after eating or when touched, fever, nausea, vomiting Thoughts of suicide or self-harm, worsening mood, feelings of depression Thyroid cancer--new mass or lump in the neck, pain or trouble swallowing, trouble breathing, hoarseness Side effects that usually do not require medical attention (report to your care team if they continue or are bothersome): Constipation Dizziness Fatigue Headache Loss of Appetite Nausea Upset stomach This list may not describe all possible side effects. Call your doctor for medical advice about side effects. You may report side effects to FDA at 1-800-FDA-1088. Where should I keep my medication? Keep out of the reach of children and pets. Store unopened pen in a refrigerator between 2 and 8 degrees C (36 and 46 degrees F). Do not freeze or use if the medication has been frozen. Protect from light and excessive heat. After you first use the pen, it can be stored at room temperature between 15 and 30 degrees C (59 and 86 degrees F) or in a refrigerator. Throw away your used pen  after 30 days or after the expiration date, whichever comes first. Do not store your pen with the needle attached. If the needle is left on, medication may leak from the pen. NOTE: This sheet is a summary. It may not cover all possible information. If you have questions about this medicine, talk to your doctor, pharmacist, or health care provider.  2023 Elsevier/Gold Standard (2020-11-30 00:00:00)  Semaglutide Injection (Weight Management) What is this medication? SEMAGLUTIDE (SEM a GLOO tide) promotes weight loss. It may also be used to maintain weight loss. It works by decreasing appetite. Changes to diet and exercise are often combined with this medication. This medicine may be used for other purposes; ask your health care provider or pharmacist if you have questions. COMMON BRAND NAME(S): [id number] What should I tell my care team before I take this medication? They need to know if you have any of these conditions: Endocrine tumors (MEN 2) or if someone in your family had these tumors Eye disease, vision problems Gallbladder disease History of depression or mental health disease History of pancreatitis Kidney disease Stomach or intestine problems Suicidal thoughts, plans, or attempt; a previous suicide attempt by you or a family member Thyroid cancer or if someone in your family had thyroid cancer An unusual or allergic reaction to semaglutide, other medications, foods,  dyes, or preservatives Pregnant or trying to get pregnant Breast-feeding How should I use this medication? This medication is injected under the skin. You will be taught how to prepare and give it. Take it as directed on the prescription label. It is given once every week (every 7 days). Keep taking it unless your care team tells you to stop. It is important that you put your used needles and pens in a special sharps container. Do not put them in a trash can. If you do not have a sharps container, call your pharmacist or  care team to get one. A special MedGuide will be given to you by the pharmacist with each prescription and refill. Be sure to read this information carefully each time. This medication comes with INSTRUCTIONS FOR USE. Ask your pharmacist for directions on how to use this medication. Read the information carefully. Talk to your pharmacist or care team if you have questions. Talk to your care team about the use of this medication in children. While it may be prescribed for children as young as 12 years for selected conditions, precautions do apply. Overdosage: If you think you have taken too much of this medicine contact a poison control center or emergency room at once. NOTE: This medicine is only for you. Do not share this medicine with others. What if I miss a dose? If you miss a dose and the next scheduled dose is more than 2 days away, take the missed dose as soon as possible. If you miss a dose and the next scheduled dose is less than 2 days away, do not take the missed dose. Take the next dose at your regular time. Do not take double or extra doses. If you miss your dose for 2 weeks or more, take the next dose at your regular time or call your care team to talk about how to restart this medication. What may interact with this medication? Insulin and other medications for diabetes This list may not describe all possible interactions. Give your health care provider a list of all the medicines, herbs, non-prescription drugs, or dietary supplements you use. Also tell them if you smoke, drink alcohol, or use illegal drugs. Some items may interact with your medicine. What should I watch for while using this medication? Visit your care team for regular checks on your progress. It may be some time before you see the benefit from this medication. Drink plenty of fluids while taking this medication. Check with your care team if you have severe diarrhea, nausea, and vomiting, or if you sweat a lot. The loss  of too much body fluid may make it dangerous for you to take this medication. This medication may affect blood sugar levels. Ask your care team if changes in diet or medications are needed if you have diabetes. If you or your family notice any changes in your behavior, such as new or worsening depression, thoughts of harming yourself, anxiety, other unusual or disturbing thoughts, or memory loss, call your care team right away. Women should inform their care team if they wish to become pregnant or think they might be pregnant. Losing weight while pregnant is not advised and may cause harm to the unborn child. Talk to your care team for more information. What side effects may I notice from receiving this medication? Side effects that you should report to your care team as soon as possible: Allergic reactions--skin rash, itching, hives, swelling of the face, lips, tongue, or throat Change in  vision Dehydration--increased thirst, dry mouth, feeling faint or lightheaded, headache, dark yellow or brown urine Gallbladder problems--severe stomach pain, nausea, vomiting, fever Heart palpitations--rapid, pounding, or irregular heartbeat Kidney injury--decrease in the amount of urine, swelling of the ankles, hands, or feet Pancreatitis--severe stomach pain that spreads to your back or gets worse after eating or when touched, fever, nausea, vomiting Thoughts of suicide or self-harm, worsening mood, feelings of depression Thyroid cancer--new mass or lump in the neck, pain or trouble swallowing, trouble breathing, hoarseness Side effects that usually do not require medical attention (report to your care team if they continue or are bothersome): Diarrhea Loss of appetite Nausea Stomach pain Vomiting This list may not describe all possible side effects. Call your doctor for medical advice about side effects. You may report side effects to FDA at 1-800-FDA-1088. Where should I keep my medication? Keep out of  the reach of children and pets. Refrigeration (preferred): Store in the refrigerator. Do not freeze. Keep this medication in the original container until you are ready to take it. Get rid of any unused medication after the expiration date. Room temperature: If needed, prior to cap removal, the pen can be stored at room temperature for up to 28 days. Protect from light. If it is stored at room temperature, get rid of any unused medication after 28 days or after it expires, whichever is first. It is important to get rid of the medication as soon as you no longer need it or it is expired. You can do this in two ways: Take the medication to a medication take-back program. Check with your pharmacy or law enforcement to find a location. If you cannot return the medication, follow the directions in the [place]. NOTE: This sheet is a summary. It may not cover all possible information. If you have questions about this medicine, talk to your doctor, pharmacist, or health care provider.  2023 Elsevier/Gold Standard (2021-01-10 00:00:00)

## 2022-07-07 MED ORDER — LOSARTAN POTASSIUM-HCTZ 50-12.5 MG PO TABS: 1.0000 | ORAL_TABLET | Freq: Every day | ORAL | 3 refills | Status: DC

## 2022-07-07 MED ORDER — ALBUTEROL SULFATE HFA 108 (90 BASE) MCG/ACT IN AERS: 1.0000 | INHALATION_SPRAY | Freq: Four times a day (QID) | RESPIRATORY_TRACT | 12 refills | Status: AC | PRN

## 2022-07-07 MED ORDER — MONTELUKAST SODIUM 10 MG PO TABS: ORAL_TABLET | ORAL | 3 refills | Status: DC

## 2022-07-09 LAB — HEMOGLOBIN A1C
Est. average glucose Bld gHb Est-mCnc: 114 mg/dL
Hgb A1c MFr Bld: 5.6 % (ref 4.8–5.6)

## 2022-07-09 LAB — C-REACTIVE PROTEIN: CRP: <1 mg/L (ref 0–10)

## 2022-07-09 LAB — QUANTIFERON-TB GOLD PLUS
QuantiFERON Mitogen Value: >10 IU/mL
QuantiFERON Nil Value: 0.01 IU/mL
QuantiFERON TB1 Ag Value: 0.04 IU/mL
QuantiFERON TB2 Ag Value: 0.01 IU/mL
QuantiFERON-TB Gold Plus: NEGATIVE

## 2022-07-09 LAB — CYCLIC CITRUL PEPTIDE ANTIBODY, IGG/IGA: Cyclic Citrullin Peptide Ab: 5 units (ref 0–19)

## 2022-07-09 LAB — SEDIMENTATION RATE: Sed Rate: 10 mm/hr (ref 0–32)

## 2022-07-09 LAB — ANA: Anti Nuclear Antibody (ANA): NEGATIVE

## 2022-07-09 LAB — RHEUMATOID FACTOR: Rheumatoid fact SerPl-aCnc: <10 IU/mL (ref <14.0)

## 2022-07-16 ENCOUNTER — Telehealth: Payer: Self-pay

## 2022-07-16 NOTE — Telephone Encounter (Signed)
Cris called from [facility] to request information for prior authorization for patient.  Cris refused to fax request for information.  Reference #[id number]

## 2022-07-17 ENCOUNTER — Telehealth: Payer: Self-pay

## 2022-07-17 NOTE — Telephone Encounter (Signed)
Pt has been notified about her Approval for wegovy.   Ref number to [employer]: Reference #[id number]  They stated they will be faxing over the approval letter as well.

## 2022-07-22 ENCOUNTER — Encounter: Payer: Self-pay | Admitting: Internal Medicine

## 2022-07-23 MED ORDER — INSULIN PEN NEEDLE 30G X 8 MM MISC: 1.0000 | 3 refills | Status: DC | PRN

## 2022-07-23 MED ORDER — SAXENDA 18 MG/3ML ~~LOC~~ SOPN: 0.6000 mg | PEN_INJECTOR | Freq: Every day | SUBCUTANEOUS | 11 refills | Status: DC

## 2022-07-23 NOTE — Addendum Note (Signed)
Addended by: Orland Mustard on: 07/23/2022 12:38 PM   Modules accepted: Orders

## 2022-07-30 ENCOUNTER — Encounter: Payer: Self-pay | Admitting: Internal Medicine

## 2022-07-31 ENCOUNTER — Telehealth: Payer: Self-pay

## 2022-07-31 NOTE — Telephone Encounter (Signed)
PA for Saxenda has been sent to plan on today awaiting determination:    Sondi Desch   (Key: [id number]) Rx #: [id number] Saxenda '18MG'$ Fayne Mediate pen-injectors   Form Librarian, academic PA Form [phone] NCPDP)

## 2022-08-01 ENCOUNTER — Other Ambulatory Visit: Payer: Self-pay

## 2022-08-01 ENCOUNTER — Other Ambulatory Visit: Payer: Self-pay | Admitting: Internal Medicine

## 2022-08-01 DIAGNOSIS — I10 Essential (primary) hypertension: Secondary | ICD-10-CM

## 2022-08-01 DIAGNOSIS — Z6828 Body mass index (BMI) 28.0-28.9, adult: Secondary | ICD-10-CM

## 2022-08-01 DIAGNOSIS — E663 Overweight: Secondary | ICD-10-CM

## 2022-08-01 DIAGNOSIS — E785 Hyperlipidemia, unspecified: Secondary | ICD-10-CM

## 2022-08-01 DIAGNOSIS — R7303 Prediabetes: Secondary | ICD-10-CM

## 2022-08-01 MED ORDER — WEGOVY 1 MG/0.5ML ~~LOC~~ SOAJ
1.0000 mg | SUBCUTANEOUS | 0 refills | Status: DC
  Filled 2022-08-01: qty 2, fill #0

## 2022-08-01 MED ORDER — WEGOVY 1.7 MG/0.75ML ~~LOC~~ SOAJ
1.7000 mg | SUBCUTANEOUS | 0 refills | Status: DC
  Filled 2022-08-01: qty 3, fill #0
  Filled 2022-09-04: qty 3, 28d supply, fill #0

## 2022-08-01 MED ORDER — SAXENDA 18 MG/3ML ~~LOC~~ SOPN
0.6000 mg | PEN_INJECTOR | Freq: Every day | SUBCUTANEOUS | 11 refills | Status: DC
  Filled 2022-08-01: qty 15, 30d supply, fill #0
  Filled 2022-09-04: qty 15, 30d supply, fill #1

## 2022-08-01 MED ORDER — INSULIN PEN NEEDLE 31G X 5 MM MISC
1.0000 | 3 refills | Status: DC | PRN
  Filled 2022-08-01: qty 100, 90d supply, fill #0
  Filled 2022-08-01: qty 90, fill #0

## 2022-08-01 MED ORDER — WEGOVY 2.4 MG/0.75ML ~~LOC~~ SOAJ
2.4000 mg | SUBCUTANEOUS | 5 refills | Status: DC
  Filled 2022-08-01: qty 9, fill #0
  Filled 2022-09-26: qty 3, 28d supply, fill #0
  Filled 2022-10-22 – 2022-10-23 (×2): qty 3, 28d supply, fill #1
  Filled 2022-11-20 – 2022-11-21 (×2): qty 3, 28d supply, fill #2
  Filled 2022-12-19: qty 3, 28d supply, fill #3

## 2022-08-01 MED ORDER — WEGOVY 0.5 MG/0.5ML ~~LOC~~ SOAJ
0.5000 mg | SUBCUTANEOUS | 0 refills | Status: DC
  Filled 2022-08-01: qty 2, fill #0

## 2022-08-01 MED ORDER — WEGOVY 0.25 MG/0.5ML ~~LOC~~ SOAJ
0.2500 mg | SUBCUTANEOUS | 0 refills | Status: DC
  Filled 2022-08-01: qty 2, 28d supply, fill #0

## 2022-08-01 NOTE — Telephone Encounter (Signed)
Pt has been approved for the Saxenda.  Pt has been informed.

## 2022-09-04 ENCOUNTER — Other Ambulatory Visit: Payer: Self-pay

## 2022-09-26 ENCOUNTER — Other Ambulatory Visit: Payer: Self-pay

## 2022-09-29 ENCOUNTER — Other Ambulatory Visit: Payer: Self-pay

## 2022-10-08 ENCOUNTER — Ambulatory Visit: Payer: BC Managed Care – PPO | Admitting: Family Medicine

## 2022-10-08 VITALS — BP 114/74 | HR 103 | Temp 97.7°F | Ht 67.01 in | Wt 175.4 lb

## 2022-10-08 DIAGNOSIS — F339 Major depressive disorder, recurrent, unspecified: Secondary | ICD-10-CM

## 2022-10-08 DIAGNOSIS — E063 Autoimmune thyroiditis: Secondary | ICD-10-CM

## 2022-10-08 DIAGNOSIS — I10 Essential (primary) hypertension: Secondary | ICD-10-CM

## 2022-10-08 DIAGNOSIS — M8580 Other specified disorders of bone density and structure, unspecified site: Secondary | ICD-10-CM

## 2022-10-08 DIAGNOSIS — R7303 Prediabetes: Secondary | ICD-10-CM

## 2022-10-08 DIAGNOSIS — E038 Other specified hypothyroidism: Secondary | ICD-10-CM | POA: Diagnosis not present

## 2022-10-08 DIAGNOSIS — E2839 Other primary ovarian failure: Secondary | ICD-10-CM | POA: Diagnosis not present

## 2022-10-08 DIAGNOSIS — F909 Attention-deficit hyperactivity disorder, unspecified type: Secondary | ICD-10-CM

## 2022-10-08 DIAGNOSIS — Z0000 Encounter for general adult medical examination without abnormal findings: Secondary | ICD-10-CM

## 2022-10-08 DIAGNOSIS — R000 Tachycardia, unspecified: Secondary | ICD-10-CM

## 2022-10-08 DIAGNOSIS — E785 Hyperlipidemia, unspecified: Secondary | ICD-10-CM

## 2022-10-08 NOTE — Assessment & Plan Note (Signed)
Follows with endocrinology. Continue NP thyroid 90 mg daily

## 2022-10-08 NOTE — Progress Notes (Signed)
SUBJECTIVE:   Chief Complaint  Patient presents with   Cyn[facility] of Care-Dr. Olivia Mackie   HPI Patient presents to clinic to transfer care  No acute concerns today.  Hypothyroidism Asymptomatic. Follows with endocrinology at [facility].  Takes NP thyroid 90 mg daily.  ADHD/Depression Doing well.  Takes Mydayis ER 50 mg daily.  Takes Prozac 40 mg daily.  Follows with Dr. Toy Care.  Weight Management Doing well.  Taking Wegovy 2.4 mg weekly and metformin XR 500 mg 1 tablet daily.  Tolerating medication well.  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  Constitutional:      General: She is not in acute distress.    Appearance: She is not ill-appearing.  HENT:     Head: Normocephalic.     Nose: Nose normal.  Eyes:     Conjunctiva/sclera: Conjunctivae normal.  Cardiovascular:     Rate and Rhythm: Regular rhythm. Tachycardia present.     Heart sounds: Normal heart sounds.  Pulmonary:     Effort: Pulmonary effort is normal.     Breath sounds: Normal breath sounds.  Abdominal:     General: Abdomen is flat. Bowel sounds are normal.     Palpations: Abdomen is soft.  Musculoskeletal:        General: Normal range of motion.     Cervical back: Normal range of motion.  Skin:    General: Skin is warm and dry.  Neurological:     Mental Status: She is alert and oriented to person, place, and time. Mental status is at baseline.  Psychiatric:        Mood and Affect: Mood normal.        Behavior: Behavior normal.        Thought Content: Thought content normal.        Judgment: Judgment normal.     ASSESSMENT/PLAN:  Primary hypertension Assessment &  Plan: Well-controlled on current antihypertensives. Continue Hyzaar 50-12.5 mg daily, with decreasing weight will monitor blood pressure and may need adjustment in future. CMet at next visit  Orders: -     Comprehensive metabolic panel; Future -     Vitamin B12; Future  Depression, recurrent ([name]) Assessment & Plan: Doing well.  Currently on Prozac 40 mg daily.  Denies any SI/HI. Follows with Dr. Toy Care    Hypothyroidism due to Hashimoto's thyroiditis Assessment & Plan: Follows with endocrinology. Continue NP thyroid 90 mg daily   Premature ovarian failure Assessment & Plan: Currently on Estrace 1 mg daily Follows with OB/GYN   Osteopenia, unspecified location Assessment & Plan: Recent bone density from 2023 normal.  Orders: -     VITAMIN D 25 Hydroxy (Vit-D Deficiency, Fractures); Future  Attention deficit hyperactivity disorder (ADHD), unspecified ADHD type Assessment & Plan: Currently on Mydayis 50 mg daily. Follows with Dr. Toy Care   Hyperlipidemia, unspecified hyperlipidemia type -     Lipid panel; Future  Prediabetes -     Hemoglobin A1c; Future  Tachycardia, unspecified Assessment & Plan: Asymptomatic.  Has been elevated in the past.  Heart rate 103 today.  Likely secondary to  stimulant use.  No indication for ECG today. Continue to monitor.    PDMP reviewed  Return in about 3 months (around 01/08/2023) for annual visit with fasting labs 1 week prior.  Carollee Leitz, MD

## 2022-10-08 NOTE — Patient Instructions (Addendum)
It was a pleasure meeting you today. Thank you for allowing me to take part in your health care.  Our goals for today as we discussed include:  Follow up with Endocrinology as scheduled  Follow up with Psychiatry as scheduled   If you have any questions or concerns, please do not hesitate to call the office at [phone].  I look forward to our next visit and until then take care and stay safe.  Regards,   Carollee Leitz, MD   [facility]

## 2022-10-14 ENCOUNTER — Telehealth: Payer: Self-pay | Admitting: *Deleted

## 2022-10-14 ENCOUNTER — Telehealth: Payer: Self-pay | Admitting: Oncology

## 2022-10-14 NOTE — Telephone Encounter (Signed)
Patient called to cancel appointments due to no insurance

## 2022-10-14 NOTE — Telephone Encounter (Signed)
I called pt back and got her voicemail and left message that I am sorry that she does not have insurance and she can still come in and she can fill out a financial profile to see if that can help and if she wants to come in or make an appt in future to please call [phone]

## 2022-10-19 ENCOUNTER — Encounter: Payer: Self-pay | Admitting: Family Medicine

## 2022-10-19 DIAGNOSIS — R000 Tachycardia, unspecified: Secondary | ICD-10-CM | POA: Insufficient documentation

## 2022-10-19 NOTE — Assessment & Plan Note (Signed)
Currently on Mydayis 50 mg daily. Follows with Dr. Toy Care

## 2022-10-19 NOTE — Assessment & Plan Note (Signed)
Asymptomatic.  Has been elevated in the past.  Heart rate 103 today.  Likely secondary to stimulant use.  No indication for ECG today. Continue to monitor.

## 2022-10-19 NOTE — Assessment & Plan Note (Signed)
Well-controlled on current antihypertensives. Continue Hyzaar 50-12.5 mg daily, with decreasing weight will monitor blood pressure and may need adjustment in future. CMet at next visit

## 2022-10-19 NOTE — Assessment & Plan Note (Signed)
Currently on Estrace 1 mg daily Follows with OB/GYN

## 2022-10-19 NOTE — Assessment & Plan Note (Signed)
Recent bone density from 2023 normal.

## 2022-10-19 NOTE — Assessment & Plan Note (Signed)
Doing well.  Currently on Prozac 40 mg daily.  Denies any SI/HI. Follows with Dr. Toy Care

## 2022-10-21 ENCOUNTER — Telehealth: Payer: Self-pay | Admitting: *Deleted

## 2022-10-21 NOTE — Telephone Encounter (Signed)
The pt left message and now she has [employer] insurance and wants an appt for lab and see md. I have askedj ennifer to give her a call with new appt

## 2022-10-22 ENCOUNTER — Other Ambulatory Visit: Payer: BC Managed Care – PPO

## 2022-10-23 ENCOUNTER — Other Ambulatory Visit: Payer: Self-pay

## 2022-10-24 ENCOUNTER — Telehealth: Payer: BC Managed Care – PPO | Admitting: Oncology

## 2022-11-07 ENCOUNTER — Encounter: Payer: Self-pay | Admitting: Family Medicine

## 2022-11-07 ENCOUNTER — Other Ambulatory Visit: Payer: Self-pay

## 2022-11-07 MED ORDER — LOSARTAN POTASSIUM-HCTZ 50-12.5 MG PO TABS: 1.0000 | ORAL_TABLET | Freq: Every day | ORAL | 3 refills | Status: DC

## 2022-11-20 ENCOUNTER — Other Ambulatory Visit: Payer: Self-pay

## 2022-11-20 ENCOUNTER — Inpatient Hospital Stay: Payer: BC Managed Care – PPO | Attending: Oncology

## 2022-11-20 DIAGNOSIS — K921 Melena: Secondary | ICD-10-CM | POA: Insufficient documentation

## 2022-11-20 DIAGNOSIS — Z9079 Acquired absence of other genital organ(s): Secondary | ICD-10-CM | POA: Diagnosis not present

## 2022-11-20 DIAGNOSIS — Z90710 Acquired absence of both cervix and uterus: Secondary | ICD-10-CM | POA: Insufficient documentation

## 2022-11-20 DIAGNOSIS — K5900 Constipation, unspecified: Secondary | ICD-10-CM | POA: Diagnosis not present

## 2022-11-20 DIAGNOSIS — Z79899 Other long term (current) drug therapy: Secondary | ICD-10-CM | POA: Insufficient documentation

## 2022-11-20 DIAGNOSIS — D75839 Thrombocytosis, unspecified: Secondary | ICD-10-CM | POA: Diagnosis present

## 2022-11-20 LAB — CBC WITH DIFFERENTIAL/PLATELET
Abs Immature Granulocytes: 0.02 10*3/uL (ref 0.00–0.07)
Basophils Absolute: 0 10*3/uL (ref 0.0–0.1)
Basophils Relative: 1 %
Eosinophils Absolute: 0.1 10*3/uL (ref 0.0–0.5)
Eosinophils Relative: 1 %
HCT: 38.3 % (ref 36.0–46.0)
Hemoglobin: 13 g/dL (ref 12.0–15.0)
Immature Granulocytes: 0 %
Lymphocytes Relative: 29 %
Lymphs Abs: 1.9 10*3/uL (ref 0.7–4.0)
MCH: 30.2 pg (ref 26.0–34.0)
MCHC: 33.9 g/dL (ref 30.0–36.0)
MCV: 88.9 fL (ref 80.0–100.0)
Monocytes Absolute: 0.5 10*3/uL (ref 0.1–1.0)
Monocytes Relative: 8 %
Neutro Abs: 4 10*3/uL (ref 1.7–7.7)
Neutrophils Relative %: 61 %
Platelets: 427 10*3/uL — ABNORMAL HIGH (ref 150–400)
RBC: 4.31 MIL/uL (ref 3.87–5.11)
RDW: 13.7 % (ref 11.5–15.5)
WBC: 6.5 10*3/uL (ref 4.0–10.5)
nRBC: 0 % (ref 0.0–0.2)

## 2022-11-21 ENCOUNTER — Inpatient Hospital Stay: Payer: BC Managed Care – PPO | Admitting: Oncology

## 2022-11-24 ENCOUNTER — Other Ambulatory Visit: Payer: Self-pay

## 2022-11-24 ENCOUNTER — Encounter: Payer: Self-pay | Admitting: Family Medicine

## 2022-11-25 ENCOUNTER — Other Ambulatory Visit: Payer: Self-pay

## 2022-11-30 NOTE — Progress Notes (Unsigned)
SUBJECTIVE:   Chief Complaint  Patient presents with   Acute Visit    Blood in stool   HPI Patient presents to clinic with concern for bloody stool.  Symptoms started 4 to 5 weeks ago.  Has had about 3-4 episodes of bright red bloody stool.  She also noticed some mucus in her stool.  Did have diarrhea that was intermittent for about 2 days but this resolved 3 weeks ago.  Reports when having caffeinated beverages causes sharp pain in the abdomen but this is relieved with bowel movements.  Endorses poor nutrition and decreased fluid intake.  Currently on metformin 1 g daily and Wegovy 2 mg weekly for weight loss.  Recently started taking stool softeners.  Reports normal large BMs daily.  Has recently been straining to move bowels.  Denies any fevers, hematemesis, dizziness, shortness of breath, chest pain or recent sick contacts.  No recent travel.  No unintentional weight loss.  No rectal pain.  Denies any history of family colon cancer.  She endorses extreme stress at home recently with parents and helping with their care.  She is requesting a referral to GI as she would like to rule out colon cancer.  She reports recent blood work 01/11 by her oncologist and hemoglobin 13.  PERTINENT PMH / PSH: Anxiety. IBS with constipation. Thrombocytosis  OBJECTIVE:  BP 122/79   Pulse 95   Temp 97.9 F (36.6 C)   Ht 5' 7.01" (1.702 m)   LMP 09/10/2018   BMI 27.46 kg/m    Physical Exam Vitals reviewed.  Constitutional:      General: She is not in acute distress.    Appearance: Normal appearance. She is not ill-appearing or toxic-appearing.  HENT:     Head: Normocephalic.     Nose: Nose normal.     Mouth/Throat:     Mouth: Mucous membranes are moist.  Eyes:     Conjunctiva/sclera: Conjunctivae normal.  Cardiovascular:     Rate and Rhythm: Normal rate.  Pulmonary:     Effort: Pulmonary effort is normal.  Abdominal:     General: Abdomen is flat and protuberant. Bowel sounds are normal.  There is no distension or abdominal bruit.     Palpations: Abdomen is soft. There is no mass or pulsatile mass.     Tenderness: There is generalized abdominal tenderness. There is no right CVA tenderness, left CVA tenderness, guarding or rebound. Negative signs include Murphy's sign.     Hernia: No hernia is present.  Musculoskeletal:        General: Normal range of motion.  Neurological:     Mental Status: She is alert and oriented to person, place, and time. Mental status is at baseline.  Psychiatric:        Mood and Affect: Mood normal.        Behavior: Behavior normal.        Thought Content: Thought content normal.        Judgment: Judgment normal.     ASSESSMENT/PLAN:  Irritable bowel syndrome with constipation Assessment & Plan: Chronic.  Suspect flare of IBS given poor nutrition and decreased hydration with worsening anxiety at home.  Considered internal hemorrhoids.  Recent CBC 13 on 01/11.  Hemodynamically stable. Start MiraLAX daily Encourage increase hydration Encouraged increased soluble fiber diet Avoid straining with BMs Refer to GI for evaluation.  Orders: -     Ambulatory referral to Gastroenterology -     Polyethylene Glycol 3350; Take 17 g by  mouth 2 (two) times daily as needed.  Dispense: 3350 g; Refill: 1  Blood in stool -     Ambulatory referral to Gastroenterology -     Polyethylene Glycol 3350; Take 17 g by mouth 2 (two) times daily as needed.  Dispense: 3350 g; Refill: 1   PDMP reviewed  No follow-ups on file.  Carollee Leitz, MD

## 2022-11-30 NOTE — Patient Instructions (Addendum)
It was a pleasure meeting you today. Thank you for allowing me to take part in your health care.  Our goals for today as we discussed include:  Stop Metformin  Will send referral to GI for evaluation  Increase fluids Increase soluble fibre in diet Increase exercise   If you have any questions or concerns, please do not hesitate to call the office at [phone].  I look forward to our next visit and until then take care and stay safe.  Regards,   Carollee Leitz, MD   [facility]

## 2022-12-01 ENCOUNTER — Ambulatory Visit: Payer: BC Managed Care – PPO | Admitting: Family Medicine

## 2022-12-01 ENCOUNTER — Encounter: Payer: Self-pay | Admitting: Oncology

## 2022-12-01 ENCOUNTER — Inpatient Hospital Stay: Payer: BC Managed Care – PPO | Admitting: Oncology

## 2022-12-01 ENCOUNTER — Inpatient Hospital Stay (HOSPITAL_BASED_OUTPATIENT_CLINIC_OR_DEPARTMENT_OTHER): Payer: BC Managed Care – PPO | Admitting: Oncology

## 2022-12-01 ENCOUNTER — Encounter: Payer: Self-pay | Admitting: Family Medicine

## 2022-12-01 VITALS — BP 122/79 | HR 95 | Temp 97.9°F | Ht 67.01 in

## 2022-12-01 DIAGNOSIS — K921 Melena: Secondary | ICD-10-CM

## 2022-12-01 DIAGNOSIS — D75839 Thrombocytosis, unspecified: Secondary | ICD-10-CM | POA: Diagnosis not present

## 2022-12-01 DIAGNOSIS — K581 Irritable bowel syndrome with constipation: Secondary | ICD-10-CM

## 2022-12-01 MED ORDER — POLYETHYLENE GLYCOL 3350 17 GM/SCOOP PO POWD: 17.0000 g | Freq: Two times a day (BID) | ORAL | 1 refills | Status: DC | PRN

## 2022-12-01 NOTE — Progress Notes (Signed)
I connected with Cynthia Burke on 12/01/22 at 11:30 AM EST by video enabled telemedicine visit and verified that I am speaking with the correct person using two identifiers.   I discussed the limitations, risks, security and privacy concerns of performing an evaluation and management service by telemedicine and the availability of in-person appointments. I also discussed with the patient that there may be a patient responsible charge related to this service. The patient expressed understanding and agreed to proceed.  Other persons participating in the visit and their role in the encounter:  none  Patient's location:  home Provider's location:  work  Risk analyst Complaint: Routine follow-up visit for thrombocytosis  History of present illness: Patient is a 47 year old female referred for thrombocytosis.  Her most recent CBC from 11/25/2021 showed a white count of 4, H&H of 13/39.3 and a platelet count of 423.  Her prior platelet count in 2019 was normal at 270.  Platelet counts between October and December 2019 have been normal except for 1 occasion admitted as 409.no prior h/o thrombosis.  She is s/p hysterectomy.  Ferritin levels have been low without overt anemia in the past  Interval history Patient is doing well presently.  She is on [facility] for weight management and since then she has been having some GI symptoms including constipation.  She has seen occasional blood in her stool as well.   Review of Systems  Constitutional:  Negative for chills, fever, malaise/fatigue and weight loss.  HENT:  Negative for congestion, ear discharge and nosebleeds.   Eyes:  Negative for blurred vision.  Respiratory:  Negative for cough, hemoptysis, sputum production, shortness of breath and wheezing.   Cardiovascular:  Negative for chest pain, palpitations, orthopnea and claudication.  Gastrointestinal:  Positive for blood in stool and constipation. Negative for abdominal pain, diarrhea, heartburn,  melena, nausea and vomiting.  Genitourinary:  Negative for dysuria, flank pain, frequency, hematuria and urgency.  Musculoskeletal:  Negative for back pain, joint pain and myalgias.  Skin:  Negative for rash.  Neurological:  Negative for dizziness, tingling, focal weakness, seizures, weakness and headaches.  Endo/Heme/Allergies:  Does not bruise/bleed easily.  Psychiatric/Behavioral:  Negative for depression and suicidal ideas. The patient does not have insomnia.     Allergies  Allergen Reactions   Amlodipine     Swelling  Other reaction(s): Other (See Comments) Swelling Swelling   Bupropion Other (See Comments)    Seizures   Other reaction(s): Other (See Comments), Other (See Comments), Other (See Comments) Seizures  seizure Seizures    Spironolactone     ?eyelid swelling worse at '50mg'$  dose   Latex Other (See Comments) and Hives    Itching, sneezing Other reaction(s): Other (See Comments) Itching, sneezing Itching, sneezing Itching, sneezing  Itching, sneezing   Other Other (See Comments)    Tree pollen   Other reaction(s): Other (See Comments), Other (See Comments) Tree pollen  unknown Tree pollen     Past Medical History:  Diagnosis Date   ADD (attention deficit disorder)    Allergy    Anemia    Anemia 12/21/2018   Annual physical exam 06/03/2019   Anxiety and depression 09/02/2018   Arthralgia 03/26/2020   Formatting of this note might be different from the original.  April 2021     Last Assessment & Plan:   Formatting of this note might be different from the original.  Work-up April 2020: normal Lyme/RMSF/ANA/Celiac panel /CK   Asthma    COVID-19    Depression  Hashimoto's disease    Hashimoto's thyroiditis    History of chicken pox    History of UTI    HLD (hyperlipidemia) 09/02/2018   Hyperlipidemia    Need for hepatitis B screening test 03/26/2020   Premature ovarian insufficiency    [facility] Dr. Hughie Closs    Rash 03/08/2020   Seizures ([place])     medication induced wellbutrin   Thumb pain, left 09/02/2018    Past Surgical History:  Procedure Laterality Date   ABDOMINAL HYSTERECTOMY     Dr. Gerarda Fraction 11-04-18   [place] OF UTERUS     2015 and 2018    HYSTERECTOMY ABDOMINAL WITH SALPINGECTOMY Bilateral 11/04/2018   Procedure: HYSTERECTOMY ABDOMINAL WITH BILATERAL SALPINGECTOMY, Exploratory laparotomy;  Surgeon: Isabel Caprice, MD;  Location: [facility];  Service: Gynecology;  Laterality: Bilateral;   MYOMECTOMY     2015     Social History   Socioeconomic History   Marital status: Married    Spouse name: Not on file   Number of children: Not on file   Years of education: Not on file   Highest education level: Not on file  Occupational History   Not on file  Tobacco Use   Smoking status: Never   Smokeless tobacco: Never  Vaping Use   Vaping Use: Never used  Substance and Sexual Activity   Alcohol use: No   Drug use: No   Sexual activity: Yes    Birth control/protection: None  Other Topics Concern   Not on file  Social History Narrative   Married    PT   No kids has pets   White Psychologist, 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 in SNF    Social Determinants of Health   Financial Resource Strain: Not on file  Food Insecurity: Not on file  Transportation Needs: Not on file  Physical Activity: Not on file  Stress: Not on file  Social Connections: Not on file  Intimate Partner Violence: Not on file    Family History  Problem Relation Age of Onset   Rheum arthritis Mother    Hashimoto's thyroiditis Mother    Hypertension Mother    Hyperlipidemia Mother    Arthritis Mother    Asthma Mother    Depression Mother    Kidney disease Mother    Miscarriages / [place] Mother    Memory loss Mother    Diabetes Mellitus II Father    Hypothyroidism Father    Hypertension Father    Coronary artery disease Father    Hyperlipidemia Father    Arthritis Father    COPD Father    Depression Father     Diabetes Father    Heart disease Father    Kidney disease Father    Bladder Cancer Father    Prostate cancer Father    Peripheral Artery Disease Father    Learning disabilities Brother    Hypertension Brother    Depression Brother    Stroke Maternal Grandmother    Heart disease Maternal Grandmother    Alcohol abuse Maternal Grandfather    Early death Maternal Grandfather    Breast cancer Paternal Grandmother 55   Early death Paternal Grandmother    Cancer Paternal Grandmother        breast died    63 / Stillbirths Paternal Grandmother    Heart disease Paternal Grandfather      Current Outpatient Medications:    albuterol (VENTOLIN HFA) 108 (90 Base) MCG/ACT inhaler, Inhale 1-2  puffs into the lungs every 6 (six) hours as needed for wheezing or shortness of breath., Disp: 1 g, Rfl: 12   Calcium Carbonate-Vit D-Min (CALTRATE 600+D PLUS PO), Take 4 capsules by mouth daily., Disp: , Rfl:    Cetirizine HCl (ZYRTEC PO), Take 1 tablet by mouth daily., Disp: , Rfl:    estradiol (ESTRACE) 1 MG tablet, Take 1 mg by mouth daily., Disp: , Rfl:    fluocinonide cream (LIDEX) 3.47 %, Apply 1 Application topically 2 (two) times daily., Disp: 60 g, Rfl: 2   FLUoxetine (PROZAC) 40 MG capsule, Take 40 mg by mouth daily. , Disp: , Rfl:    losartan-hydrochlorothiazide (HYZAAR) 50-12.5 MG tablet, Take 1 tablet by mouth daily. In am. D/c 0.5 tablet daily, Disp: 90 tablet, Rfl: 3   metFORMIN (GLUCOPHAGE-XR) 500 MG 24 hr tablet, Take 2 tablets (1,000 mg total) by mouth daily with breakfast., Disp: 180 tablet, Rfl: 3   montelukast (SINGULAIR) 10 MG tablet, TAKE 1 TABLET BY MOUTH EVERYDAY AT BEDTIME, Disp: 90 tablet, Rfl: 3   MYDAYIS 50 MG CP24, Take 1 capsule by mouth every morning., Disp: , Rfl:    Semaglutide-Weight Management (WEGOVY) 2.4 MG/0.75ML SOAJ, Inject 2.4 mg into the skin once a week., Disp: 9 mL, Rfl: 5   thyroid (ARMOUR) 90 MG tablet, TAKE 90 MG (ONE TABLET) SIX DAYS PER WEEK ONLY.,  Disp: , Rfl:   No results found.  No images are attached to the encounter.      Latest Ref Rng & Units 11/25/2021   10:47 AM  CMP  Glucose 70 - 99 mg/dL 98   BUN 6 - 23 mg/dL 15   Creatinine 0.40 - 1.20 mg/dL 0.84   Sodium 135 - 145 mEq/L 135   Potassium 3.5 - 5.1 mEq/L 3.8   Chloride 96 - 112 mEq/L 99   CO2 19 - 32 mEq/L 30   Calcium 8.4 - 10.5 mg/dL 9.0   Total Protein 6.0 - 8.3 g/dL 6.9   Total Bilirubin 0.2 - 1.2 mg/dL 0.3   Alkaline Phos 39 - 117 U/L 51   AST 0 - 37 U/L 22   ALT 0 - 35 U/L 20       Latest Ref Rng & Units 11/20/2022    2:34 PM  CBC  WBC 4.0 - 10.5 K/uL 6.5   Hemoglobin 12.0 - 15.0 g/dL 13.0   Hematocrit 36.0 - 46.0 % 38.3   Platelets 150 - 400 K/uL 427      Observation/objective: Appears in no acute distress over video visit today.  Breathing is nonlabored  Assessment and plan: Patient is a 47 year old female and this is a routine follow-up visit for thrombocytosis   Patient has a mildly elevated platelet count which has been fluctuating between 3 60-430 and has remained overall stable in the last 1 year.  White count and hemoglobin are normal.  I am holding off on additional investigations such as JAK2 mutation testing since her platelet counts have not been uptrending.  I will repeat CBC with differential in 6 months in 1 year and see her back in 1 year.  Given that she has had low ferritin levels in the past I will check them in 6 months as well.  I have asked patient to review symptoms of rectal bleeding since starting [facility] with her primary care doctor and if symptoms continue she will need to see GI  Follow-up instructions: As above  I discussed the assessment and treatment plan  with the patient. The patient was provided an opportunity to ask questions and all were answered. The patient agreed with the plan and demonstrated an understanding of the instructions.   The patient was advised to call back or seek an in-person evaluation if the  symptoms worsen or if the condition fails to improve as anticipated.  I provided 11 minutes of face-to-face video visit time during this encounter.  Time spent in reviewing labs and coordinating future appointments Visit Diagnosis: 1. Thrombocytosis     Dr. Randa Evens, MD, MPH [facility] at [facility] Tel- [phone] 12/01/2022 2:50 PM

## 2022-12-03 ENCOUNTER — Encounter: Payer: Self-pay | Admitting: Family Medicine

## 2022-12-03 NOTE — Assessment & Plan Note (Addendum)
Chronic.  Suspect flare of IBS given poor nutrition and decreased hydration with worsening anxiety at home.  Considered internal hemorrhoids.  Recent CBC 13 on 01/11.  Hemodynamically stable. Start MiraLAX daily Encourage increase hydration Encouraged increased soluble fiber diet Avoid straining with BMs Refer to GI for evaluation.

## 2022-12-04 ENCOUNTER — Telehealth: Payer: Self-pay | Admitting: Oncology

## 2022-12-22 ENCOUNTER — Other Ambulatory Visit: Payer: Self-pay

## 2023-01-01 ENCOUNTER — Other Ambulatory Visit (INDEPENDENT_AMBULATORY_CARE_PROVIDER_SITE_OTHER): Payer: BC Managed Care – PPO

## 2023-01-01 DIAGNOSIS — R7303 Prediabetes: Secondary | ICD-10-CM

## 2023-01-01 DIAGNOSIS — M8580 Other specified disorders of bone density and structure, unspecified site: Secondary | ICD-10-CM

## 2023-01-01 DIAGNOSIS — E785 Hyperlipidemia, unspecified: Secondary | ICD-10-CM | POA: Diagnosis not present

## 2023-01-01 DIAGNOSIS — I10 Essential (primary) hypertension: Secondary | ICD-10-CM

## 2023-01-01 LAB — LIPID PANEL
Cholesterol: 226 mg/dL — ABNORMAL HIGH (ref 0–200)
HDL: 47.5 mg/dL (ref >39.00)
LDL Cholesterol: 153 mg/dL — ABNORMAL HIGH (ref 0–99)
NonHDL: 178.3
Total CHOL/HDL Ratio: 5
Triglycerides: 128 mg/dL (ref 0.0–149.0)
VLDL: 25.6 mg/dL (ref 0.0–40.0)

## 2023-01-01 LAB — COMPREHENSIVE METABOLIC PANEL
ALT: 14 U/L (ref 0–35)
AST: 16 U/L (ref 0–37)
Albumin: 4.5 g/dL (ref 3.5–5.2)
Alkaline Phosphatase: 47 U/L (ref 39–117)
BUN: 14 mg/dL (ref 6–23)
CO2: 29 mEq/L (ref 19–32)
Calcium: 9.7 mg/dL (ref 8.4–10.5)
Chloride: 99 mEq/L (ref 96–112)
Creatinine, Ser: 0.83 mg/dL (ref 0.40–1.20)
GFR: 85.46 mL/min (ref >60.00)
Glucose, Bld: 88 mg/dL (ref 70–99)
Potassium: 3.7 mEq/L (ref 3.5–5.1)
Sodium: 136 mEq/L (ref 135–145)
Total Bilirubin: 0.4 mg/dL (ref 0.2–1.2)
Total Protein: 6.9 g/dL (ref 6.0–8.3)

## 2023-01-01 LAB — VITAMIN D 25 HYDROXY (VIT D DEFICIENCY, FRACTURES): VITD: 29.67 ng/mL — ABNORMAL LOW (ref 30.00–100.00)

## 2023-01-01 LAB — VITAMIN B12: Vitamin B-12: 682 pg/mL (ref 211–911)

## 2023-01-01 LAB — HEMOGLOBIN A1C: Hgb A1c MFr Bld: 5.4 % (ref 4.6–6.5)

## 2023-01-03 ENCOUNTER — Other Ambulatory Visit: Payer: Self-pay | Admitting: Family Medicine

## 2023-01-04 ENCOUNTER — Other Ambulatory Visit: Payer: Self-pay | Admitting: Family Medicine

## 2023-01-04 DIAGNOSIS — E559 Vitamin D deficiency, unspecified: Secondary | ICD-10-CM

## 2023-01-04 MED ORDER — VITAMIN D (ERGOCALCIFEROL) 1.25 MG (50000 UNIT) PO CAPS: 50000.0000 [IU] | ORAL_CAPSULE | ORAL | 1 refills | Status: DC

## 2023-01-05 ENCOUNTER — Encounter: Payer: Self-pay | Admitting: Family Medicine

## 2023-01-05 ENCOUNTER — Other Ambulatory Visit: Payer: Self-pay

## 2023-01-05 ENCOUNTER — Other Ambulatory Visit (HOSPITAL_COMMUNITY): Payer: Self-pay

## 2023-01-05 ENCOUNTER — Telehealth: Payer: Self-pay

## 2023-01-05 MED ORDER — LISDEXAMFETAMINE DIMESYLATE 70 MG PO CAPS: ORAL_CAPSULE | ORAL | 0 refills | Status: DC

## 2023-01-05 NOTE — Telephone Encounter (Signed)
Mychart sent to pt w/ results

## 2023-01-08 ENCOUNTER — Ambulatory Visit (INDEPENDENT_AMBULATORY_CARE_PROVIDER_SITE_OTHER): Payer: BC Managed Care – PPO | Admitting: Family Medicine

## 2023-01-08 ENCOUNTER — Other Ambulatory Visit: Payer: Self-pay

## 2023-01-08 ENCOUNTER — Encounter: Payer: Self-pay | Admitting: Family Medicine

## 2023-01-08 VITALS — BP 120/70 | HR 93 | Temp 98.2°F | Ht 66.0 in | Wt 171.0 lb

## 2023-01-08 DIAGNOSIS — I10 Essential (primary) hypertension: Secondary | ICD-10-CM

## 2023-01-08 DIAGNOSIS — R7303 Prediabetes: Secondary | ICD-10-CM

## 2023-01-08 DIAGNOSIS — E663 Overweight: Secondary | ICD-10-CM | POA: Diagnosis not present

## 2023-01-08 DIAGNOSIS — Z136 Encounter for screening for cardiovascular disorders: Secondary | ICD-10-CM

## 2023-01-08 DIAGNOSIS — Z1231 Encounter for screening mammogram for malignant neoplasm of breast: Secondary | ICD-10-CM

## 2023-01-08 DIAGNOSIS — R000 Tachycardia, unspecified: Secondary | ICD-10-CM

## 2023-01-08 DIAGNOSIS — Z6828 Body mass index (BMI) 28.0-28.9, adult: Secondary | ICD-10-CM

## 2023-01-08 DIAGNOSIS — E785 Hyperlipidemia, unspecified: Secondary | ICD-10-CM | POA: Diagnosis not present

## 2023-01-08 DIAGNOSIS — E038 Other specified hypothyroidism: Secondary | ICD-10-CM

## 2023-01-08 DIAGNOSIS — E063 Autoimmune thyroiditis: Secondary | ICD-10-CM

## 2023-01-08 DIAGNOSIS — M8580 Other specified disorders of bone density and structure, unspecified site: Secondary | ICD-10-CM

## 2023-01-08 MED ORDER — WEGOVY 2.4 MG/0.75ML ~~LOC~~ SOAJ
2.4000 mg | SUBCUTANEOUS | 5 refills | Status: DC
  Filled 2023-01-08: qty 9, 84d supply, fill #0
  Filled 2023-04-07: qty 3, 28d supply, fill #1
  Filled 2023-04-09 – 2023-04-13 (×2): qty 9, 84d supply, fill #1

## 2023-01-08 MED ORDER — WEGOVY 2.4 MG/0.75ML ~~LOC~~ SOAJ: 2.4000 mg | SUBCUTANEOUS | 5 refills | Status: DC

## 2023-01-08 NOTE — Patient Instructions (Addendum)
It was a pleasure meeting you today. Thank you for allowing me to take part in your health care.  Our goals for today as we discussed include:  Mammogram due April 2024 Colonoscopy due at age 47  Lipid screening Total cholesterol was high at 226 (nml is <200) and LDL (bad) cholesterol was 153 which is also elevated (nml is <99).  You can decrease these numbers by: -limiting your intake of processed foods and foods high in saturated fats.  -increasing your physical activity -increasing your intake of vegetables, healthy fish (bluefin tuna, wild salmon, and sardines), whole grains and fiber rich foods .   -You can also use extra virgin olive oil instead of butter. -If you smoke, quitting can decrease LDL cholesterol and raise HDL cholesterol.  CT for calcium score has been ordered.  They will call you for an appointment.  We can recheck your cholesterol in 6-12 months, if you continue to have elevated numbers we will discuss medication options at that time.  Diabetic screening A1c normal  Refill sent for your [facility].  Please schedule next annual physical in 1 year. Schedule lab work 1 year week before office visit.  Fast for 10 hours.   If you have any questions or concerns, please do not hesitate to call the office at [phone].  I look forward to our next visit and until then take care and stay safe.  Regards,   Carollee Leitz, MD   [facility]

## 2023-01-08 NOTE — Progress Notes (Signed)
SUBJECTIVE:   Chief Complaint  Patient presents with   Annual Exam   HPI Patient presents to clinic for annual physical and discussion of recent labs  No acute concerns today  Hyperlipidemia Recent LDL elevated.  Not currently on statin therapy.  Discussed with patient low ASCVD risk.  Can lower levels with monitoring diet and increasing exercise.  Would like repeat levels in 6 months.  ADHD Follows with psychiatry.  Unable to get midday as as pharmacy was unable to stock.  Currently on Vyvanse 70 mg daily.  Patient reports she feels better with the Vyvanse however effect does not last as long [place].  She reports pharmacy not able to fill prescription from providers here outside of [facility].  She currently follows with Dr. Toy Care in [place].  Weight management Doing well on Wegovy.  Tolerating 2.4 mg dose weekly.  Weight continues to decline.  Now down 4 pounds since last visit.   PERTINENT PMH / PSH: ADHD Overweight Hypertension Hypothyroidism  OBJECTIVE:  BP 120/70   Pulse 93   Temp 98.2 F (36.8 C) (Oral)   Ht '5\' 6"'$  (1.676 m)   Wt 171 lb (77.6 kg)   LMP 09/10/2018   SpO2 99%   BMI 27.60 kg/m    Physical Exam Constitutional:      General: She is not in acute distress.    Appearance: She is normal weight. She is not ill-appearing.  HENT:     Head: Normocephalic.  Eyes:     Conjunctiva/sclera: Conjunctivae normal.  Neck:     Thyroid: No thyromegaly or thyroid tenderness.  Cardiovascular:     Rate and Rhythm: Normal rate and regular rhythm.     Pulses: Normal pulses.  Pulmonary:     Effort: Pulmonary effort is normal.     Breath sounds: Normal breath sounds.  Abdominal:     General: Bowel sounds are normal.  Neurological:     Mental Status: She is alert. Mental status is at baseline.  Psychiatric:        Mood and Affect: Mood normal.        Behavior: Behavior normal.        Thought Content: Thought content normal.        Judgment: Judgment  normal.     ASSESSMENT/PLAN:  Overweight (BMI 25.0-29.9) Assessment & Plan: Doing well on GLP-1. Refill Wegovy 2.4 mg weekly  Orders: -     CBC with Differential/Platelet; Future  Primary hypertension Assessment & Plan: Chronic.  Stable.  Well-controlled.  Creatinine stable. Continue Hyzaar 50-12.5 mg daily   Orders: [id number]; Inject 2.4 mg into the skin once a week.  Dispense: 9 mL; Refill: 5 -     Comprehensive metabolic panel; Future  Prediabetes Assessment & Plan: A1c normal  Orders: [id number]; Inject 2.4 mg into the skin once a week.  Dispense: 9 mL; Refill: 5 -     Hemoglobin A1c; Future -     Vitamin B12; Future -     VITAMIN D 25 Hydroxy (Vit-D Deficiency, Fractures); Future  Hyperlipidemia, unspecified hyperlipidemia type Assessment & Plan: LDL not at goal.  Low ASCVD risk. Discussed calcium score.  Patient would like to proceed. Repeat LDL in 6 months.  Orders: -     CT CARDIAC SCORING (SELF PAY ONLY); Future -     Wegovy; Inject 2.4 mg into the skin once a week.  Dispense: 9 mL; Refill: 5 -  Lipid panel; Future -     LDL cholesterol, direct; Future  Breast cancer screening by mammogram -     3D Screening Mammogram, Left and Right; Future  Hypothyroidism due to Hashimoto's thyroiditis Assessment & Plan: Follows with endocrinology. Continue NP thyroid 90 mg daily Monday through Saturday and 2 tablets on Sundays   Osteopenia, unspecified location Assessment & Plan: Recent bone density from 2023 normal. History of premature ovarian failure. Plan for repeat bone density in 2 years.   Tachycardia, unspecified Assessment & Plan: Initially elevated however decreased prior to leaving office. Likely secondary to stimulant use   Screening for ischemic heart disease (IHD) -     CT CARDIAC SCORING (SELF PAY ONLY); Future  HCM Mammogram up-to-date.  Due 04/24 Bone density up-to-date.  Due 2025.  Premature ovarian failure No indication  for Pap smear.  TAH 2019 COVID up-to-date Tetanus up-to-date.  Due 10/29   PDMP reviewed  Return in about 6 months (around 07/09/2023) for annual visit with fasting labs 1 week prior.  Carollee Leitz, MD

## 2023-01-09 ENCOUNTER — Other Ambulatory Visit: Payer: Self-pay

## 2023-01-11 ENCOUNTER — Encounter: Payer: Self-pay | Admitting: Family Medicine

## 2023-01-11 DIAGNOSIS — Z1231 Encounter for screening mammogram for malignant neoplasm of breast: Secondary | ICD-10-CM | POA: Insufficient documentation

## 2023-01-11 NOTE — Assessment & Plan Note (Signed)
A1c normal

## 2023-01-11 NOTE — Assessment & Plan Note (Signed)
Chronic.  Currently on Vyvanse 70 mg daily.  Pharmacy did not have Mydayis 50 mg in stock. Follows with Dr. Toy Care

## 2023-01-11 NOTE — Assessment & Plan Note (Addendum)
Doing well on GLP-1. Refill Wegovy 2.4 mg weekly

## 2023-01-11 NOTE — Assessment & Plan Note (Signed)
Follows with endocrinology. Continue NP thyroid 90 mg daily Monday through Saturday and 2 tablets on Sundays

## 2023-01-11 NOTE — Assessment & Plan Note (Signed)
LDL not at goal.  Low ASCVD risk. Discussed calcium score.  Patient would like to proceed. Repeat LDL in 6 months.

## 2023-01-11 NOTE — Assessment & Plan Note (Signed)
Recent bone density from 2023 normal. History of premature ovarian failure. Plan for repeat bone density in 2 years.

## 2023-01-11 NOTE — Assessment & Plan Note (Signed)
Chronic.  Stable.  Well-controlled.  Creatinine stable. Continue Hyzaar 50-12.5 mg daily

## 2023-01-11 NOTE — Assessment & Plan Note (Signed)
Initially elevated however decreased prior to leaving office. Likely secondary to stimulant use

## 2023-01-19 ENCOUNTER — Encounter: Payer: Self-pay | Admitting: Gastroenterology

## 2023-01-19 ENCOUNTER — Ambulatory Visit: Payer: BC Managed Care – PPO | Admitting: Gastroenterology

## 2023-01-19 ENCOUNTER — Other Ambulatory Visit: Payer: Self-pay

## 2023-01-19 VITALS — BP 131/83 | HR 101 | Temp 98.3°F | Ht 66.0 in | Wt 174.0 lb

## 2023-01-19 DIAGNOSIS — R1310 Dysphagia, unspecified: Secondary | ICD-10-CM

## 2023-01-19 DIAGNOSIS — Z1211 Encounter for screening for malignant neoplasm of colon: Secondary | ICD-10-CM

## 2023-01-19 DIAGNOSIS — K5904 Chronic idiopathic constipation: Secondary | ICD-10-CM | POA: Diagnosis not present

## 2023-01-19 DIAGNOSIS — R112 Nausea with vomiting, unspecified: Secondary | ICD-10-CM | POA: Diagnosis not present

## 2023-01-19 MED ORDER — LINACLOTIDE 145 MCG PO CAPS: 145.0000 ug | ORAL_CAPSULE | Freq: Every day | ORAL | 2 refills | Status: DC

## 2023-01-19 MED ORDER — NA SULFATE-K SULFATE-MG SULF 17.5-3.13-1.6 GM/177ML PO SOLN: 354.0000 mL | Freq: Once | ORAL | 0 refills | Status: DC

## 2023-01-19 MED ORDER — NA SULFATE-K SULFATE-MG SULF 17.5-3.13-1.6 GM/177ML PO SOLN: 354.0000 mL | Freq: Once | ORAL | 0 refills | Status: AC

## 2023-01-19 NOTE — Progress Notes (Signed)
Cephas Darby, MD [address]  [place]  [place], [place] [address]  Main: [phone]  Fax: [phone]    Gastroenterology Consultation  Referring Provider:     Carollee Leitz, MD Primary Care Physician:  Carollee Leitz, MD Primary Gastroenterologist:  Dr. Cephas Darby Reason for Consultation: Reflux, nausea, vomiting, chronic constipation, rectal bleeding        HPI:   Cynthia Burke is a 47 y.o. female referred by Dr. Carollee Leitz, MD  for consultation & management of approximately 2-3 months history symptoms of heartburn, nausea, intermittent episodes of vomiting.  She also reports 2 months history of intermittent rectal bleeding, that happened on 2 separate occasions, it was self-limited, blood on wiping.  She has history of chronic constipation.  She is on [facility] for weight loss and lost about 10 to 15 pounds.  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  She has tried MiraLAX which did not seem to help.  Patient did not want to get on weighing scale today in the office.  She has an app to track her weight and she checks her weight about once a week, she did not check her weight today at home.  NSAIDs: None  Antiplts/Anticoagulants/Anti thrombotics: None  GI Procedures: None She denies any family history of celiac disease, IBD, GI malignancy  Past Medical History:  Diagnosis Date   ADD (attention deficit disorder)    Allergy    Anemia    Anemia 12/21/2018   Annual physical exam 06/03/2019   Anxiety and depression 09/02/2018   Arthralgia 03/26/2020   Formatting of this note might be different from the original.  April 2021     Last Assessment & Plan:   Formatting of this note might be different from the original.  Work-up April 2020: normal Lyme/RMSF/ANA/Celiac panel /CK   Asthma    BMI 28.0-28.9,adult 07/04/2022   COVID-19    Depression    Hashimoto's disease    Hashimoto's thyroiditis    History of chicken pox    History of  UTI    HLD (hyperlipidemia) 09/02/2018   Hyperlipidemia    Need for hepatitis B screening test 03/26/2020   Premature ovarian insufficiency    [facility] 29 Dr. Hughie Closs    Rash 03/08/2020   Seizures ([place])    medication induced wellbutrin   Thumb pain, left 09/02/2018    Past Surgical History:  Procedure Laterality Date   ABDOMINAL HYSTERECTOMY     Dr. Gerarda Fraction 11-04-18   [place] OF UTERUS     2015 and 2018    HYSTERECTOMY ABDOMINAL WITH SALPINGECTOMY Bilateral 11/04/2018   Procedure: HYSTERECTOMY ABDOMINAL WITH BILATERAL SALPINGECTOMY, Exploratory laparotomy;  Surgeon: Isabel Caprice, MD;  Location: [facility];  Service: Gynecology;  Laterality: Bilateral;   MYOMECTOMY     2015      Current Outpatient Medications:    albuterol (VENTOLIN HFA) 108 (90 Base) MCG/ACT inhaler, Inhale 1-2 puffs into the lungs every 6 (six) hours as needed for wheezing or shortness of breath., Disp: 1 g, Rfl: 12   Calcium Carbonate-Vit D-Min (CALTRATE 600+D PLUS PO), Take 4 capsules by mouth daily., Disp: , Rfl:    Cetirizine HCl (ZYRTEC PO), Take 1 tablet by mouth daily., Disp: , Rfl:    estradiol (ESTRACE) 0.1 MG/GM vaginal cream, Place vaginally., Disp: , Rfl:    estradiol (ESTRACE) 1 MG tablet, Take 1 mg by mouth daily., Disp: , Rfl:  fluocinonide cream (LIDEX) [id number] %, Apply 1 Application topically 2 (two) times daily., Disp: 60 g, Rfl: 2   FLUoxetine (PROZAC) 40 MG capsule, Take 40 mg by mouth daily. , Disp: , Rfl:    losartan-hydrochlorothiazide (HYZAAR) 50-12.5 MG tablet, Take 1 tablet by mouth daily. In am. D/c 0.5 tablet daily, Disp: 90 tablet, Rfl: 3   Probiotic, Lactobacillus, CAPS, , Disp: , Rfl:    Semaglutide-Weight Management (WEGOVY) 2.4 MG/0.75ML SOAJ, Inject 2.4 mg into the skin once a week., Disp: 9 mL, Rfl: 5   Sulfacetamide Sodium, Acne, 10 % LOTN, , Disp: , Rfl:    Vitamin D, Ergocalciferol, (DRISDOL) 1.25 MG (50000 UNIT) CAPS capsule, Take 1 capsule (50,000 Units total)  by mouth every 7 (seven) days., Disp: 12 capsule, Rfl: 1   linaclotide (LINZESS) 145 MCG CAPS capsule, Take 1 capsule (145 mcg total) by mouth daily before breakfast., Disp: 30 capsule, Rfl: 2   Family History  Problem Relation Age of Onset   Rheum arthritis Mother    Hashimoto's thyroiditis Mother    Hypertension Mother    Hyperlipidemia Mother    Arthritis Mother    Asthma Mother    Depression Mother    Kidney disease Mother    43 / [place] Mother    Memory loss Mother    Diabetes Mellitus II Father    Hypothyroidism Father    Hypertension Father    Coronary artery disease Father    Hyperlipidemia Father    Arthritis Father    COPD Father    Depression Father    Diabetes Father    Heart disease Father    Kidney disease Father    Bladder Cancer Father    Prostate cancer Father    Peripheral Artery Disease Father    Learning disabilities Brother    Hypertension Brother    Depression Brother    Stroke Maternal Grandmother    Heart disease Maternal Grandmother    Alcohol abuse Maternal Grandfather    Early death Maternal Grandfather    Breast cancer Paternal Grandmother 68   Early death Paternal Grandmother    Cancer Paternal 38        breast died    68 / Stillbirths Paternal Grandmother    Heart disease Paternal Grandfather      Social History   Tobacco Use   Smoking status: Never   Smokeless tobacco: Never  Vaping Use   Vaping Use: Never used  Substance Use Topics   Alcohol use: No   Drug use: No    Allergies as of 01/19/2023 - Review Complete 01/19/2023  Allergen Reaction Noted   Amlodipine  03/08/2020   Bupropion Other (See Comments) 09/02/2018   Spironolactone  11/04/2021   Latex Other (See Comments) and Hives 08/26/2014   Other Other (See Comments) 12/21/2016    Review of Systems:    All systems reviewed and negative except where noted in HPI.   Physical Exam:  BP 131/83 (BP Location: Left Arm, Patient  Position: Sitting, Cuff Size: Normal)   Pulse (!) 101   Temp 98.3 F (36.8 C) (Oral)   Ht '5\' 6"'$  (1.676 m)   Wt 174 lb (78.9 kg)   LMP 09/10/2018   BMI 28.08 kg/m  Patient's last menstrual period was 09/10/2018.  General:   Alert,  Well-developed, well-nourished, pleasant and cooperative in NAD Head:  Normocephalic and atraumatic. Eyes:  Sclera clear, no icterus.   Conjunctiva pink. Ears:  Normal auditory acuity. Nose:  No deformity,  discharge, or lesions. Mouth:  No deformity or lesions,oropharynx pink & moist. Neck:  Supple; no masses or thyromegaly. Lungs:  Respirations even and unlabored.  Clear throughout to auscultation.   No wheezes, crackles, or rhonchi. No acute distress. Heart:  Regular rate and rhythm; no murmurs, clicks, rubs, or gallops. Abdomen:  Normal bowel sounds. Soft, non-tender and non-distended without masses, hepatosplenomegaly or hernias noted.  No guarding or rebound tenderness.   Rectal: Not performed Msk:  Symmetrical without gross deformities. Good, equal movement & strength bilaterally. Pulses:  Normal pulses noted. Extremities:  No clubbing or edema.  No cyanosis. Neurologic:  Alert and oriented x3;  grossly normal neurologically. Skin:  Intact without significant lesions or rashes. No jaundice. Psych:  Alert and cooperative. Normal mood and affect.  Imaging Studies: Reviewed  Assessment and Plan:   Cynthia Burke is a 47 y.o. female with history of ADHD, obesity on Wegovy, hypertension is seen in consultation for symptoms of nausea, vomiting, chronic constipation and significant with episodes of rectal bleeding  Episodes of nausea and vomiting ?  Side effects of Wegovy Check celiac disease present given history of mild iron deficiency without anemia Discussed about upper endoscopy for further evaluation of nausea and vomiting episodes Also, recommend colonoscopy and patient would like to wait until she turns 86 which is next  month Informed patient that we need her latest weight as per the weighing scale in our office in order to provide anesthesia during EGD and colonoscopy.  Patient declined to get her weight checked in the office.  After explaining her the importance of office-based weight check, she finally agreed but she was not happy with the situation.  Because patient states that she gets anxious to know her weight if it is more than what she weighs at home.  Reassured her that variation of 3 pounds is acceptable on her clothes    Chronic idiopathic constipation Trial of Linzess 145 mcg daily Discussed about high-fiber diet, information provided Advised adequate intake of water  Follow up after above workup   Cephas Darby, MD

## 2023-01-20 ENCOUNTER — Encounter: Payer: Self-pay | Admitting: Gastroenterology

## 2023-01-21 ENCOUNTER — Ambulatory Visit
Admission: RE | Admit: 2023-01-21 | Discharge: 2023-01-21 | Disposition: A | Discharge status: Home / Self Care | Payer: BC Managed Care – PPO | Source: Ambulatory Visit | Attending: Family Medicine | Admitting: Family Medicine

## 2023-01-21 DIAGNOSIS — E785 Hyperlipidemia, unspecified: Secondary | ICD-10-CM | POA: Insufficient documentation

## 2023-01-21 DIAGNOSIS — Z136 Encounter for screening for cardiovascular disorders: Secondary | ICD-10-CM | POA: Insufficient documentation

## 2023-01-21 LAB — CELIAC DISEASE PANEL
Endomysial IgA: NEGATIVE
IgA/Immunoglobulin A, Serum: 173 mg/dL (ref 87–352)
Transglutaminase IgA: <2 U/mL (ref 0–3)

## 2023-01-23 ENCOUNTER — Encounter: Payer: Self-pay | Admitting: Family Medicine

## 2023-02-19 ENCOUNTER — Ambulatory Visit: Admit: 2023-02-19 | Payer: BC Managed Care – PPO | Admitting: Gastroenterology

## 2023-02-19 SURGERY — COLONOSCOPY WITH PROPOFOL
Anesthesia: General

## 2023-02-26 ENCOUNTER — Encounter: Payer: Self-pay | Admitting: Gastroenterology

## 2023-03-02 ENCOUNTER — Other Ambulatory Visit: Payer: BC Managed Care – PPO

## 2023-03-16 ENCOUNTER — Encounter: Payer: Self-pay | Admitting: Family Medicine

## 2023-03-16 NOTE — Telephone Encounter (Signed)
Last BD 04/21/22  Please advise, thank you!

## 2023-03-17 ENCOUNTER — Other Ambulatory Visit: Payer: Self-pay | Admitting: Family Medicine

## 2023-03-17 DIAGNOSIS — E2839 Other primary ovarian failure: Secondary | ICD-10-CM

## 2023-04-02 ENCOUNTER — Ambulatory Visit
Admission: RE | Admit: 2023-04-02 | Discharge: 2023-04-02 | Disposition: A | Discharge status: Home / Self Care | Payer: BC Managed Care – PPO | Source: Ambulatory Visit | Attending: Family Medicine | Admitting: Family Medicine

## 2023-04-02 DIAGNOSIS — Z1231 Encounter for screening mammogram for malignant neoplasm of breast: Secondary | ICD-10-CM | POA: Diagnosis present

## 2023-04-03 ENCOUNTER — Encounter: Payer: Self-pay | Admitting: Family Medicine

## 2023-04-08 ENCOUNTER — Other Ambulatory Visit: Payer: Self-pay

## 2023-04-09 ENCOUNTER — Other Ambulatory Visit: Payer: Self-pay

## 2023-04-13 ENCOUNTER — Other Ambulatory Visit: Payer: Self-pay

## 2023-04-13 ENCOUNTER — Telehealth: Payer: Self-pay | Admitting: Family Medicine

## 2023-04-13 NOTE — Telephone Encounter (Signed)
Alya called in requesting additional info about pt for her [facility] PA they received. She's available [phone]

## 2023-04-13 NOTE — Telephone Encounter (Signed)
Called and spoke to [facility] and provided the answers to the questions. ANDREA stated it will take 70 hours for a determination and that we will be faxed the decision.  Reference # : [id number]

## 2023-04-15 ENCOUNTER — Encounter: Payer: Self-pay | Admitting: Family Medicine

## 2023-04-16 NOTE — Telephone Encounter (Signed)
Please advise 

## 2023-04-17 ENCOUNTER — Other Ambulatory Visit: Payer: Self-pay

## 2023-04-24 NOTE — Telephone Encounter (Signed)
Phone call to insurance company to appeal Wegovy denial.  Per insurance company there is no form or website to complete an appeal.  She asked that I fax a document to [phone].  Faxed the following information to insurance company.    Cynthia Burke DOB: [birth year] Address: [address] [place], [place] [address] Telephone: [phone] Prior Auth #: [id number]  PA for Wegovy previously denied for weight loss would like to add the following Diagnosis:  Hypertension I10 Hyperlipidemia E78.5

## 2023-05-07 ENCOUNTER — Encounter: Payer: Self-pay | Admitting: Family Medicine

## 2023-05-13 NOTE — Telephone Encounter (Signed)
Please be advised we currently do not have a Pharmacist to review denials, therefore you will need to process appeals accordingly as needed. Thanks for your support at this time.   Appeal info provided in denial, attached to pt's chart in media section, thanks.

## 2023-05-18 ENCOUNTER — Telehealth: Payer: Self-pay

## 2023-05-18 MED ORDER — LINACLOTIDE 145 MCG PO CAPS: 145.0000 ug | ORAL_CAPSULE | Freq: Every day | ORAL | 0 refills | Status: DC

## 2023-05-18 NOTE — Telephone Encounter (Signed)
Received a refill request by Fax for Linzess  Last office visit 01/19/2023  Last refill 01/19/2023 2 refills  Patient canceled appointment for colonoscopy and EGD on 02/19/2023 and never rescheduled  No appointment is scheduled

## 2023-06-01 ENCOUNTER — Inpatient Hospital Stay: Payer: BC Managed Care – PPO | Attending: Oncology

## 2023-06-01 DIAGNOSIS — D75839 Thrombocytosis, unspecified: Secondary | ICD-10-CM | POA: Insufficient documentation

## 2023-06-05 ENCOUNTER — Encounter: Payer: Self-pay | Admitting: Oncology

## 2023-06-10 ENCOUNTER — Encounter: Payer: Self-pay | Admitting: *Deleted

## 2023-06-10 ENCOUNTER — Inpatient Hospital Stay: Payer: BC Managed Care – PPO

## 2023-06-10 DIAGNOSIS — D75839 Thrombocytosis, unspecified: Secondary | ICD-10-CM

## 2023-06-10 LAB — CBC WITH DIFFERENTIAL/PLATELET
Abs Immature Granulocytes: 0.01 10*3/uL (ref 0.00–0.07)
Basophils Absolute: 0 10*3/uL (ref 0.0–0.1)
Basophils Relative: 1 %
Eosinophils Absolute: 0.1 10*3/uL (ref 0.0–0.5)
Eosinophils Relative: 3 %
HCT: 38.7 % (ref 36.0–46.0)
Hemoglobin: 12.9 g/dL (ref 12.0–15.0)
Immature Granulocytes: 0 %
Lymphocytes Relative: 24 %
Lymphs Abs: 1.2 10*3/uL (ref 0.7–4.0)
MCH: 29.9 pg (ref 26.0–34.0)
MCHC: 33.3 g/dL (ref 30.0–36.0)
MCV: 89.6 fL (ref 80.0–100.0)
Monocytes Absolute: 0.5 10*3/uL (ref 0.1–1.0)
Monocytes Relative: 9 %
Neutro Abs: 3.2 10*3/uL (ref 1.7–7.7)
Neutrophils Relative %: 63 %
Platelets: 387 10*3/uL (ref 150–400)
RBC: 4.32 MIL/uL (ref 3.87–5.11)
RDW: 13.2 % (ref 11.5–15.5)
WBC: 5 10*3/uL (ref 4.0–10.5)
nRBC: 0 % (ref 0.0–0.2)

## 2023-06-10 LAB — IRON AND TIBC
Iron: 58 ug/dL (ref 28–170)
Saturation Ratios: 13 % (ref 10.4–31.8)
TIBC: 454 ug/dL — ABNORMAL HIGH (ref 250–450)
UIBC: 396 ug/dL

## 2023-06-10 LAB — FERRITIN: Ferritin: 17 ng/mL (ref 11–307)

## 2023-06-10 NOTE — Telephone Encounter (Signed)
-----   Message from Creig Hines sent at 06/10/2023 12:13 PM EDT ----- No anemia but has iron deficiency. Does she want to continue with po iron alone or want iv iron

## 2023-06-10 NOTE — Telephone Encounter (Signed)
Left a vm for the patient to call me back

## 2023-06-15 NOTE — Telephone Encounter (Signed)
Called patient and left a message for her to call me back.

## 2023-06-15 NOTE — Telephone Encounter (Signed)
-----   Message from Creig Hines sent at 06/10/2023 12:13 PM EDT ----- No anemia but has iron deficiency. Does she want to continue with po iron alone or want iv iron

## 2023-06-16 ENCOUNTER — Telehealth: Payer: Self-pay | Admitting: *Deleted

## 2023-06-16 NOTE — Telephone Encounter (Signed)
Called over to patient's telephone and left her a message that the hemoglobin is good but her iron studies are a little low and Dr. Smith Robert wanted to know if she wants to continue on the oral iron or to try some IV iron.  I left my direct number for her to call me back and let me know or use MyChart

## 2023-06-24 ENCOUNTER — Telehealth: Payer: Self-pay | Admitting: *Deleted

## 2023-06-24 NOTE — Telephone Encounter (Signed)
Called the patient today and left her a message on her voicemail that I did call last week and wanted to make sure if she needs some IV iron depending on how she is feeling.  Left my direct  telephone number if she wants some IV iron to let us know

## 2023-07-01 ENCOUNTER — Telehealth: Payer: Self-pay

## 2023-07-01 NOTE — Telephone Encounter (Signed)
Left message to return call to our office.  Pt Wegovy was approved from 06/21/2023  To 12/22/2023

## 2023-07-08 ENCOUNTER — Encounter: Payer: Self-pay | Admitting: Family Medicine

## 2023-07-08 ENCOUNTER — Other Ambulatory Visit: Payer: Self-pay | Admitting: Gastroenterology

## 2023-07-08 MED ORDER — LINACLOTIDE 145 MCG PO CAPS: 145.0000 ug | ORAL_CAPSULE | Freq: Every day | ORAL | 3 refills | Status: AC

## 2023-07-08 NOTE — Telephone Encounter (Signed)
Pt requesting starting dose of wegovy sent to pharmacy. Medication has been pended.

## 2023-07-10 MED ORDER — WEGOVY 0.25 MG/0.5ML ~~LOC~~ SOAJ: 0.2500 mg | SUBCUTANEOUS | 0 refills | Status: DC

## 2023-07-10 NOTE — Telephone Encounter (Signed)
Left message to return call to our office.  

## 2023-07-15 ENCOUNTER — Other Ambulatory Visit (HOSPITAL_COMMUNITY): Payer: Self-pay

## 2023-07-15 NOTE — Telephone Encounter (Signed)
Left message to return call to our office.  

## 2023-07-15 NOTE — Telephone Encounter (Signed)
Per test claim medication is $24.99 through [facility].

## 2023-07-16 ENCOUNTER — Other Ambulatory Visit: Payer: Self-pay | Admitting: Family Medicine

## 2023-07-16 ENCOUNTER — Other Ambulatory Visit: Payer: Self-pay

## 2023-07-16 DIAGNOSIS — R7303 Prediabetes: Secondary | ICD-10-CM

## 2023-07-16 DIAGNOSIS — E785 Hyperlipidemia, unspecified: Secondary | ICD-10-CM

## 2023-07-16 DIAGNOSIS — E663 Overweight: Secondary | ICD-10-CM

## 2023-07-16 DIAGNOSIS — I10 Essential (primary) hypertension: Secondary | ICD-10-CM

## 2023-07-16 MED ORDER — TIRZEPATIDE-WEIGHT MANAGEMENT 2.5 MG/0.5ML ~~LOC~~ SOLN
2.5000 mg | SUBCUTANEOUS | 0 refills | Status: DC
Start: 2023-07-16 — End: 2023-07-16

## 2023-07-16 MED ORDER — TIRZEPATIDE-WEIGHT MANAGEMENT 2.5 MG/0.5ML ~~LOC~~ SOLN
2.5000 mg | SUBCUTANEOUS | 0 refills | Status: DC
Start: 2023-07-16 — End: 2023-07-17

## 2023-07-16 NOTE — Telephone Encounter (Signed)
Patient states she is following up on her previous message regarding Wegovy and Zepbound.  Patient states she is really frustrated and would like to speak with a person.  I spoke with Vertis Kelch, CMA, and transferred call to her.

## 2023-07-16 NOTE — Telephone Encounter (Signed)
Spoke to pt about this see previous note.

## 2023-07-17 ENCOUNTER — Encounter: Payer: Self-pay | Admitting: Family Medicine

## 2023-07-17 ENCOUNTER — Other Ambulatory Visit: Payer: Self-pay | Admitting: Family Medicine

## 2023-07-17 DIAGNOSIS — I10 Essential (primary) hypertension: Secondary | ICD-10-CM

## 2023-07-17 DIAGNOSIS — E663 Overweight: Secondary | ICD-10-CM

## 2023-07-17 DIAGNOSIS — L709 Acne, unspecified: Secondary | ICD-10-CM

## 2023-07-17 DIAGNOSIS — E785 Hyperlipidemia, unspecified: Secondary | ICD-10-CM

## 2023-07-17 MED ORDER — SAXENDA 18 MG/3ML ~~LOC~~ SOPN
0.6000 mg | PEN_INJECTOR | Freq: Every day | SUBCUTANEOUS | 0 refills | Status: DC
Start: 2023-07-17 — End: 2023-08-04

## 2023-07-17 MED ORDER — SULFACETAMIDE SODIUM (ACNE) 10 % EX LOTN
TOPICAL_LOTION | CUTANEOUS | 0 refills | Status: DC
Start: 2023-07-17 — End: 2023-08-20

## 2023-07-20 ENCOUNTER — Encounter: Payer: Self-pay | Admitting: Oncology

## 2023-07-20 ENCOUNTER — Telehealth: Payer: Self-pay | Admitting: Family Medicine

## 2023-07-20 NOTE — Telephone Encounter (Signed)
Training and development officer called concerning one of the the medications. They number is [phone]. The reference # is [id number].

## 2023-07-21 ENCOUNTER — Other Ambulatory Visit (HOSPITAL_COMMUNITY): Payer: Self-pay

## 2023-07-21 ENCOUNTER — Telehealth: Payer: Self-pay

## 2023-07-21 NOTE — Telephone Encounter (Signed)
Noted  

## 2023-07-21 NOTE — Telephone Encounter (Signed)
Pharmacy Patient Advocate Encounter  Received notification from [facility] that Prior Authorization for Saxenda has been APPROVED from 07/21/23 to 11/10/23   PA #/Case ID/Reference #: [id number]  Left a voicemail at [facility] to notify of the approval.   Approval letter indexed to media tab

## 2023-07-21 NOTE — Telephone Encounter (Signed)
For Saxenda?

## 2023-07-21 NOTE — Telephone Encounter (Signed)
Pharmacy Patient Advocate Encounter   Received notification from Pt Calls Messages that prior authorization for Saxenda 18MG /3ML pen-injectors is required/requested.   Insurance verification completed.   The patient is insured through [facility] .   Per test claim: PA required; PA submitted to [facility] via CoverMyMeds Key/confirmation #/EOC [id number] Status is pending

## 2023-07-21 NOTE — Telephone Encounter (Signed)
PA request has been Submitted. New Encounter created for follow up. For additional info see Pharmacy Prior Auth telephone encounter from 07/21/2023.

## 2023-07-28 ENCOUNTER — Encounter: Payer: Self-pay | Admitting: Family Medicine

## 2023-07-29 NOTE — Telephone Encounter (Signed)
You are more then welcome if you have any other question feel free to reach out to [place].

## 2023-07-29 NOTE — Telephone Encounter (Signed)
Called pt and her vm was full unable to leave a message. I did send the pt a mychart message with Dr. Clent Ridges medication doses.

## 2023-08-01 ENCOUNTER — Other Ambulatory Visit: Payer: Self-pay | Admitting: Family Medicine

## 2023-08-01 DIAGNOSIS — E663 Overweight: Secondary | ICD-10-CM

## 2023-08-01 DIAGNOSIS — E785 Hyperlipidemia, unspecified: Secondary | ICD-10-CM

## 2023-08-01 DIAGNOSIS — I10 Essential (primary) hypertension: Secondary | ICD-10-CM

## 2023-08-03 ENCOUNTER — Other Ambulatory Visit: Payer: Self-pay | Admitting: Family Medicine

## 2023-08-03 DIAGNOSIS — I10 Essential (primary) hypertension: Secondary | ICD-10-CM

## 2023-08-03 DIAGNOSIS — E663 Overweight: Secondary | ICD-10-CM

## 2023-08-03 DIAGNOSIS — E785 Hyperlipidemia, unspecified: Secondary | ICD-10-CM

## 2023-08-04 ENCOUNTER — Encounter: Payer: Self-pay | Admitting: Family Medicine

## 2023-08-05 ENCOUNTER — Other Ambulatory Visit: Payer: Self-pay

## 2023-08-05 DIAGNOSIS — I10 Essential (primary) hypertension: Secondary | ICD-10-CM

## 2023-08-05 DIAGNOSIS — E785 Hyperlipidemia, unspecified: Secondary | ICD-10-CM

## 2023-08-05 DIAGNOSIS — E663 Overweight: Secondary | ICD-10-CM

## 2023-08-05 MED ORDER — SAXENDA 18 MG/3ML ~~LOC~~ SOPN
PEN_INJECTOR | SUBCUTANEOUS | 3 refills | Status: DC
Start: 2023-08-05 — End: 2023-09-29

## 2023-08-05 NOTE — Telephone Encounter (Signed)
Spoke with pt. New prescription send to pharmacy

## 2023-08-07 ENCOUNTER — Encounter: Payer: Self-pay | Admitting: Family Medicine

## 2023-08-19 ENCOUNTER — Other Ambulatory Visit: Payer: Self-pay | Admitting: Family Medicine

## 2023-08-19 DIAGNOSIS — L709 Acne, unspecified: Secondary | ICD-10-CM

## 2023-09-03 ENCOUNTER — Encounter: Payer: Self-pay | Admitting: Family Medicine

## 2023-09-03 ENCOUNTER — Ambulatory Visit: Payer: BC Managed Care – PPO | Admitting: Family Medicine

## 2023-09-03 VITALS — BP 124/84 | HR 97 | Temp 97.9°F | Resp 16 | Ht 67.0 in | Wt 184.0 lb

## 2023-09-03 DIAGNOSIS — Z23 Encounter for immunization: Secondary | ICD-10-CM

## 2023-09-03 DIAGNOSIS — F39 Unspecified mood [affective] disorder: Secondary | ICD-10-CM

## 2023-09-03 DIAGNOSIS — E663 Overweight: Secondary | ICD-10-CM

## 2023-09-03 DIAGNOSIS — Z6828 Body mass index (BMI) 28.0-28.9, adult: Secondary | ICD-10-CM

## 2023-09-03 NOTE — Progress Notes (Signed)
SUBJECTIVE:   Chief Complaint  Patient presents with   Weight Check    Saxenda   HPI Presents for follow up weight management  Discussed the use of AI scribe software for clinical note transcription with the patient, who gave verbal consent to proceed.  History of Present Illness The patient, with a history of obesity, presents for a weight follow-up. They report a weight gain of 10 pounds since the last visit, which was a year ago. However, they note a recent weight loss of 1 pound since their visit to the endocrinologist. The patient expresses frustration with their weight loss progress, citing discrepancies between their home scale and the scales at various doctor's offices. They admit to feeling discouraged and have stopped weighing themselves at home.  The patient is currently on the maximum dose of Saxenda (Liraglutide). Despite this, they report no significant changes in their weight. They express a lack of time for exercise due to work and family responsibilities, including caring for their aging parents.  Previously, the patient was on Wegovy [facility]), another weight management medication, and reported better results. However, due to insurance issues and medication shortages, they had to switch to [place]. The patient expresses a desire to return to [facility] if possible, but is open to trying other medications like Zepbound.  The patient acknowledges the need for lifestyle changes and expresses interest in working with a dietician. They have previously worked with a dietician and found it helpful. However, they express frustration with the lack of accountability and the constant unexpected events in their life that disrupt their routine. They also mention early menopause as a contributing factor to their weight issues.  The patient requests a refill of needles for their Saxenda injections. They have not yet received their flu vaccine for the year.    PERTINENT PMH / PSH: As  above  OBJECTIVE:  BP 124/84   Pulse 97   Temp 97.9 F (36.6 C)   Resp 16   Ht 5\' 7"  (1.702 m)   Wt 184 lb (83.5 kg)   LMP 09/10/2018   SpO2 98%   BMI 28.82 kg/m    Physical Exam Vitals reviewed.  Constitutional:      General: She is not in acute distress.    Appearance: Normal appearance. She is normal weight. She is not ill-appearing, toxic-appearing or diaphoretic.  Eyes:     General:        Right eye: No discharge.        Left eye: No discharge.     Conjunctiva/sclera: Conjunctivae normal.  Cardiovascular:     Rate and Rhythm: Normal rate.  Pulmonary:     Effort: Pulmonary effort is normal.  Musculoskeletal:        General: Normal range of motion.  Skin:    General: Skin is warm and dry.  Neurological:     General: No focal deficit present.     Mental Status: She is alert and oriented to person, place, and time. Mental status is at baseline.  Psychiatric:        Mood and Affect: Mood normal.        Behavior: Behavior normal.        Thought Content: Thought content normal.        Judgment: Judgment normal.        09/03/2023    8:28 AM 01/08/2023    1:35 PM 12/01/2022    4:34 PM 10/08/2022    1:14 PM 07/04/2022  1:53 PM  Depression screen PHQ 2/9  Decreased Interest 1 1 1 2 1   Down, Depressed, Hopeless 3 1 1 1 1   PHQ - 2 Score 4 2 2 3 2   Altered sleeping 3 1 1 3 3   Tired, decreased energy 1 2 2 2 2   Change in appetite 0 0 0 0 0  Feeling bad or failure about yourself  1 1 1 1 2   Trouble concentrating 0 0 0 0 3  Moving slowly or fidgety/restless 0 0 0 0 0  Suicidal thoughts 0 0 0 0 0  PHQ-9 Score 9 6 6 9 12   Difficult doing work/chores Very difficult Somewhat difficult Somewhat difficult Very difficult Very difficult      09/03/2023    8:28 AM 12/01/2022    4:35 PM 10/08/2022    3:44 PM 09/24/2021    3:54 PM  GAD 7 : Generalized Anxiety Score  Nervous, Anxious, on Edge 1 1 1 3   Control/stop worrying 0 1 0 1  Worry too much - different things 1 0  1 1  Trouble relaxing 2 0 0 3  Restless 1 0 0 1  Easily annoyed or irritable 1 2 2 3   Afraid - awful might happen 2 1 2 1   Total GAD 7 Score 8 5 6 13   Anxiety Difficulty Somewhat difficult Somewhat difficult Somewhat difficult Extremely difficult    ASSESSMENT/PLAN:  Overweight (BMI 25.0-29.9) Assessment & Plan: Weight increased 10 lbs on Saxenda 3mg  daily. Discussed the importance of lifestyle changes and diet in conjunction with medication for effective weight management. Patient expressed interest in switching back to [facility] if insurance allows. Had issues with insurance so was off medication -Continue Saxenda 3mg  daily. -Consider switching back to [facility] in January if insurance approves. -Encourage patient to establish a routine for healthy eating and consider a referral to [facility] for further support.  Orders: -     Pen Needles; Use daily with Saxenda  Dispense: 100 each; Refill: 3  Need for influenza vaccination -     Flu vaccine trivalent PF, 6mos and older(Flulaval,Afluria,Fluarix,Fluzone)  Mood disorder (HCC) Assessment & Plan: Patient expressed high levels of stress and interest in seeing a therapist. -Recommended patient to explore options for therapy, including online platforms like [facility]. -Continue to follow up with Psychiatry     PDMP reviewed  Return in about 6 months (around 03/03/2024) for PCP.  Dana Allan, MD

## 2023-09-03 NOTE — Patient Instructions (Addendum)
It was a pleasure meeting you today. Thank you for allowing me to take part in your health care.  Our goals for today as we discussed include:  Received Flu vaccine today  Continue Saxenda 3 mg daily Will send in refills for needles   Look at [facility] website to see if this may be an option for you with your weight loss. [facility] [address] [place] [phone]   Delrae Rend MD  [email or web address]  [facility]  [address] [place] [address] [phone]    [email or web address] [address], [place] [phone]    This is a list of the screening recommended for you and due dates:  Health Maintenance  Topic Date Due   Pap with HPV screening  04/16/2020   Colon Cancer Screening  Never done   Flu Shot  06/11/2023   COVID-19 Vaccine (4 - 2023-24 season) 07/12/2023   DTaP/Tdap/Td vaccine (2 - Td or Tdap) 09/02/2028   Hepatitis C Screening  Completed   HIV Screening  Completed   HPV Vaccine  Aged Out     If you have any questions or concerns, please do not hesitate to call the office at [phone].  I look forward to our next visit and until then take care and stay safe.  Regards,   Dana Allan, MD   [facility]

## 2023-09-04 ENCOUNTER — Encounter: Payer: Self-pay | Admitting: Family Medicine

## 2023-09-04 DIAGNOSIS — Z23 Encounter for immunization: Secondary | ICD-10-CM | POA: Diagnosis not present

## 2023-09-04 MED ORDER — PEN NEEDLES 32G X 6 MM MISC
3 refills | Status: DC
Start: 2023-09-04 — End: 2024-01-27

## 2023-09-04 NOTE — Assessment & Plan Note (Signed)
Patient expressed high levels of stress and interest in seeing a therapist. -Recommended patient to explore options for therapy, including online platforms like [facility]. -Continue to follow up with Psychiatry

## 2023-09-04 NOTE — Assessment & Plan Note (Signed)
Weight increased 10 lbs on Saxenda 3mg  daily. Discussed the importance of lifestyle changes and diet in conjunction with medication for effective weight management. Patient expressed interest in switching back to [facility] if insurance allows. Had issues with insurance so was off medication -Continue Saxenda 3mg  daily. -Consider switching back to [facility] in January if insurance approves. -Encourage patient to establish a routine for healthy eating and consider a referral to [facility] for further support.

## 2023-09-15 ENCOUNTER — Telehealth: Payer: Self-pay

## 2023-09-15 NOTE — Telephone Encounter (Signed)
Left message to return call to our office.  Got a refill request for a medication that is not on pt medication list.

## 2023-09-29 ENCOUNTER — Encounter: Payer: Self-pay | Admitting: Family Medicine

## 2023-09-29 DIAGNOSIS — I10 Essential (primary) hypertension: Secondary | ICD-10-CM

## 2023-09-29 DIAGNOSIS — E663 Overweight: Secondary | ICD-10-CM

## 2023-09-29 DIAGNOSIS — E785 Hyperlipidemia, unspecified: Secondary | ICD-10-CM

## 2023-09-29 LAB — HM PAP SMEAR: HM Pap smear: NEGATIVE

## 2023-09-29 MED ORDER — TIRZEPATIDE 2.5 MG/0.5ML ~~LOC~~ SOAJ
2.5000 mg | SUBCUTANEOUS | 0 refills | Status: DC
Start: 2023-09-29 — End: 2024-01-27

## 2023-10-02 ENCOUNTER — Other Ambulatory Visit (HOSPITAL_COMMUNITY): Payer: Self-pay

## 2023-10-02 ENCOUNTER — Telehealth: Payer: Self-pay

## 2023-10-02 NOTE — Telephone Encounter (Signed)
Pharmacy Patient Advocate Encounter   Received notification from Patient Advice Request messages that prior authorization for Zepbound 2.5MG /0.5ML pen-injectors is required/requested.   Insurance verification completed.   The patient is insured through [facility] .   Per test claim: PA required; PA submitted to above mentioned insurance via CoverMyMeds Key/confirmation #/EOC [facility] Status is pending

## 2023-10-05 NOTE — Telephone Encounter (Signed)
Pharmacy Patient Advocate Encounter  Received notification from [facility] that Prior Authorization for Zepbound 2.5MG /0.5ML pen-injectors has been CANCELLED due to: [employer] has a denied request on file for ZEPBOUND for this member. Please follow the appeals process outlined in the original denial or contact Prior Authorization Department at [phone] for further questions.   PA #/Case ID/Reference #: [id number]

## 2023-10-05 NOTE — Telephone Encounter (Signed)
Pt notified via mychart

## 2023-10-05 NOTE — Telephone Encounter (Signed)
NOTED

## 2023-10-07 ENCOUNTER — Telehealth: Payer: Self-pay

## 2023-10-07 NOTE — Telephone Encounter (Signed)
Submitted PA through cover my meds for the Linzess 145. Waiting on response from insurance company

## 2023-10-18 ENCOUNTER — Other Ambulatory Visit: Payer: Self-pay | Admitting: Family Medicine

## 2023-12-02 ENCOUNTER — Inpatient Hospital Stay: Payer: BC Managed Care – PPO | Admitting: Oncology

## 2023-12-02 ENCOUNTER — Inpatient Hospital Stay: Payer: BC Managed Care – PPO

## 2023-12-02 ENCOUNTER — Telehealth: Payer: Self-pay | Admitting: Oncology

## 2023-12-02 NOTE — Telephone Encounter (Signed)
Patient called to r/s her appointment due to [facility] Robert being remote- appointment rescheduled and patient will get info from [facility]

## 2023-12-27 ENCOUNTER — Encounter: Payer: Self-pay | Admitting: Oncology

## 2023-12-28 ENCOUNTER — Inpatient Hospital Stay: Payer: BC Managed Care – PPO

## 2023-12-28 ENCOUNTER — Inpatient Hospital Stay: Payer: BC Managed Care – PPO | Admitting: Oncology

## 2023-12-28 NOTE — Telephone Encounter (Signed)
 Push out appt by 2 weeks

## 2024-01-06 ENCOUNTER — Other Ambulatory Visit: Payer: Self-pay

## 2024-01-07 ENCOUNTER — Other Ambulatory Visit: Payer: Self-pay

## 2024-01-13 ENCOUNTER — Telehealth: Payer: Self-pay | Admitting: Family Medicine

## 2024-01-13 ENCOUNTER — Encounter: Payer: Self-pay | Admitting: Family Medicine

## 2024-01-13 NOTE — Telephone Encounter (Signed)
 Patient would like to have labs done before her physical next week, no orders.

## 2024-01-19 ENCOUNTER — Encounter: Payer: Self-pay | Admitting: Family Medicine

## 2024-01-20 ENCOUNTER — Ambulatory Visit
Admission: RE | Admit: 2024-01-20 | Discharge: 2024-01-20 | Disposition: A | Discharge status: Home / Self Care | Source: Ambulatory Visit

## 2024-01-20 VITALS — BP 129/82 | HR 86 | Temp 98.1°F | Resp 18

## 2024-01-20 DIAGNOSIS — J0100 Acute maxillary sinusitis, unspecified: Secondary | ICD-10-CM | POA: Diagnosis not present

## 2024-01-20 DIAGNOSIS — R21 Rash and other nonspecific skin eruption: Secondary | ICD-10-CM

## 2024-01-20 MED ORDER — PREDNISONE 10 MG (21) PO TBPK: ORAL_TABLET | Freq: Every day | ORAL | 0 refills | Status: DC

## 2024-01-20 MED ORDER — AMOXICILLIN-POT CLAVULANATE 875-125 MG PO TABS: 1.0000 | ORAL_TABLET | Freq: Two times a day (BID) | ORAL | 0 refills | Status: DC

## 2024-01-20 NOTE — Discharge Instructions (Addendum)
 Take the Augmentin and prednisone as directed.  Follow-up with your primary care provider if your symptoms are not improving.

## 2024-01-20 NOTE — ED Provider Notes (Signed)
 Renaldo Fiddler    CSN: [id number] Arrival date & time: 01/20/24  1324      History   Chief Complaint Chief Complaint  Patient presents with   Generalized Body Aches    I have a semi productive cough that won't go away and a rash on my forearm (left) and both elbows.  Also, I probably had a norovirus about a month ago and a full blown head cold with sore throat last week. - Entered by patient    HPI Cynthia Burke is a 48 y.o. female.  Patient presents with 2-week history of congestion, sinus pressure, cough, fatigue.  Treatment attempted with OTC cold medication.  No fever, wheezing, shortness of breath.  Patient has history of asthma but has not needed to use her rescue inhaler.  Patient also presents with pruritic rash on her arms x 1.5 weeks.  Treatment attempted with triamcinolone cream; this was prescribed a couple of years ago for an eczema rash and is likely out of date.  No open wounds or drainage.  The history is provided by the patient and medical records.    Past Medical History:  Diagnosis Date   ADD (attention deficit disorder)    Allergy    Anemia    Anemia 12/21/2018   Annual physical exam 06/03/2019   Anxiety and depression 09/02/2018   Arthralgia 03/26/2020   Formatting of this note might be different from the original.  April 2021     Last Assessment & Plan:   Formatting of this note might be different from the original.  Work-up April 2020: normal Lyme/RMSF/ANA/Celiac panel /CK   Asthma    BMI 28.0-28.9,adult 07/04/2022   COVID-19    Depression    Hashimoto's disease    Hashimoto's thyroiditis    History of chicken pox    History of UTI    HLD (hyperlipidemia) 09/02/2018   Hyperlipidemia    Need for hepatitis B screening test 03/26/2020   Premature ovarian insufficiency    [facility] 29 Dr. Daiva Nakayama    Rash 03/08/2020   Seizures (HCC)    medication induced wellbutrin   Thumb pain, left 09/02/2018    Patient Active Problem List    Diagnosis Date Noted   Need for influenza vaccination 09/04/2023   Breast cancer screening by mammogram 01/11/2023   Tachycardia, unspecified 10/19/2022   Primary hypertension 07/04/2022   Hyperlipidemia 07/04/2022   Prediabetes 02/05/2022   Stress 10/31/2021   Mood disorder (HCC) 10/31/2021   Osteopenia 04/12/2020   Acne vulgaris 03/08/2020   Allergic rhinitis 12/08/2019   Irritable bowel syndrome with constipation 06/03/2019   Overweight (BMI 25.0-29.9) 09/02/2018   ADHD 09/02/2018   Asthma 09/02/2018   Allergies 09/02/2018   Premature ovarian failure 09/02/2018   Hypothyroidism due to Hashimoto's thyroiditis 12/29/2016    Past Surgical History:  Procedure Laterality Date   ABDOMINAL HYSTERECTOMY     Dr. Doroteo Glassman 11-04-18   DILATION AND CURETTAGE OF UTERUS     2015 and 2018    HYSTERECTOMY ABDOMINAL WITH SALPINGECTOMY Bilateral 11/04/2018   Procedure: HYSTERECTOMY ABDOMINAL WITH BILATERAL SALPINGECTOMY, Exploratory laparotomy;  Surgeon: Shonna Chock, MD;  Location: [facility];  Service: Gynecology;  Laterality: Bilateral;   MYOMECTOMY     2015     OB History     Gravida  0   Para  0   Term  0   Preterm  0   AB  0   Living  0  SAB  0   IAB  0   Ectopic  0   Multiple  0   Live Births  0            Home Medications    Prior to Admission medications   Medication Sig Start Date End Date Taking? Authorizing Provider  amoxicillin-clavulanate (AUGMENTIN) 875-125 MG tablet Take 1 tablet by mouth every 12 (twelve) hours. 01/20/24  Yes Mickie Bail, NP  predniSONE (STERAPRED UNI-PAK 21 TAB) 10 MG (21) TBPK tablet Take by mouth daily. As directed 01/20/24  Yes Mickie Bail, NP  thyroid (ARMOUR) 90 MG tablet Take one tablet SIX days per week. Take two tablets on Sundays. 11/30/19  Yes [provider]  albuterol (VENTOLIN HFA) 108 (90 Base) MCG/ACT inhaler Inhale 1-2 puffs into the lungs every 6 (six) hours as needed for wheezing or shortness of  breath. 07/07/22   McLean-Scocuzza, Pasty Spillers, MD  Amphet-Dextroamphet 3-Bead ER (MYDAYIS) 50 MG CP24 Take 50 mg by mouth daily.    [provider]  Calcium Carbonate-Vit D-Min (CALTRATE 600+D PLUS PO) Take 4 capsules by mouth daily.    [provider]  Cetirizine HCl (ZYRTEC PO) Take 1 tablet by mouth daily.    [provider]  estradiol (ESTRACE) 0.1 MG/GM vaginal cream Place vaginally. 01/10/23   [provider]  estradiol (ESTRACE) 1 MG tablet Take 1 mg by mouth daily.    [provider]  fluocinonide cream (LIDEX) 0.05 % Apply 1 Application topically 2 (two) times daily. 07/04/22   McLean-Scocuzza, Pasty Spillers, MD  FLUoxetine (PROZAC) 40 MG capsule Take 40 mg by mouth daily.  10/13/17   [provider]  Insulin Pen Needle (PEN NEEDLES) 32G X 6 MM MISC Use daily with Saxenda 09/04/23   Dana Allan, MD  linaclotide [facility]) 145 MCG CAPS capsule Take 1 capsule (145 mcg total) by mouth daily before breakfast. 07/08/23   Vanga, Loel Dubonnet, MD  losartan-hydrochlorothiazide [facility]) 50-12.5 MG tablet TAKE 1 TABLET BY MOUTH DAILY IN THE MORNING. 10/19/23   Dana Allan, MD  Probiotic, Lactobacillus, CAPS  04/02/17   [provider]  Sulfacetamide Sodium, Acne, 10 % LOTN APPLY TOPICALLY TO THE AFFECTED AREA TWICE DAILY AS NEEDED Patient not taking: Reported on 01/20/2024 08/20/23   Dana Allan, MD  tirzepatide [facility]) 2.5 MG/0.5ML Pen Inject 2.5 mg into the skin once a week. 09/29/23   Dana Allan, MD  Vilazodone HCl (VIIBRYD) 40 MG TABS Take 40 mg by mouth daily.    [provider]  Vitamin D, Ergocalciferol, (DRISDOL) 1.25 MG (50000 UNIT) CAPS capsule Take 1 capsule (50,000 Units total) by mouth every 7 (seven) days. 01/04/23   Dana Allan, MD  lisdexamfetamine (VYVANSE) 70 MG capsule 1 PO QAM 01/05/23 01/05/23      Family History Family History  Problem Relation Age of Onset   Rheum arthritis Mother    Hashimoto's thyroiditis  Mother    Hypertension Mother    Hyperlipidemia Mother    Arthritis Mother    Asthma Mother    Depression Mother    Kidney disease Mother    Miscarriages / [place] Mother    Memory loss Mother    Diabetes Mellitus II Father    Hypothyroidism Father    Hypertension Father    Coronary artery disease Father    Hyperlipidemia Father    Arthritis Father    COPD Father    Depression Father    Diabetes Father  Heart disease Father    Kidney disease Father    Bladder Cancer Father    Prostate cancer Father    Peripheral Artery Disease Father    Learning disabilities Brother    Hypertension Brother    Depression Brother    Stroke Maternal Grandmother    Heart disease Maternal Grandmother    Alcohol abuse Maternal Grandfather    Early death Maternal Grandfather    Breast cancer Paternal Grandmother 21   Early death Paternal Grandmother    Cancer Paternal Grandmother        breast died    Miscarriages / Stillbirths Paternal Grandmother    Heart disease Paternal Grandfather     Social History Social History   Tobacco Use   Smoking status: Never   Smokeless tobacco: Never  Vaping Use   Vaping status: Never Used  Substance Use Topics   Alcohol use: No   Drug use: No     Allergies   Amlodipine, Bupropion, Spironolactone, Latex, and Other   Review of Systems Review of Systems  Constitutional:  Negative for chills and fever.  HENT:  Positive for congestion and sinus pressure. Negative for ear pain and sore throat.   Respiratory:  Positive for cough. Negative for shortness of breath and wheezing.   Gastrointestinal:  Negative for diarrhea and vomiting.  Skin:  Positive for color change and rash.     Physical Exam Triage Vital Signs ED Triage Vitals [01/20/24 0840]  Encounter Vitals Group     BP 129/82     Systolic BP Percentile      Diastolic BP Percentile      Pulse Rate 86     Resp 18     Temp 98.1 F (36.7 C)     Temp src      SpO2 99 %     Weight       Height      Head Circumference      Peak Flow      Pain Score      Pain Loc      Pain Education      Exclude from Growth Chart    No data found.  Updated Vital Signs BP 129/82   Pulse 86   Temp 98.1 F (36.7 C)   Resp 18   LMP 09/10/2018   SpO2 99%   Visual Acuity Right Eye Distance:   Left Eye Distance:   Bilateral Distance:    Right Eye Near:   Left Eye Near:    Bilateral Near:     Physical Exam Constitutional:      General: She is not in acute distress. HENT:     Right Ear: Tympanic membrane normal.     Left Ear: Tympanic membrane normal.     Nose: Congestion present.     Mouth/Throat:     Mouth: Mucous membranes are moist.     Pharynx: Oropharynx is clear.  Cardiovascular:     Rate and Rhythm: Normal rate and regular rhythm.     Heart sounds: Normal heart sounds.  Pulmonary:     Effort: Pulmonary effort is normal. No respiratory distress.     Breath sounds: Normal breath sounds.  Skin:    General: Skin is warm and dry.     Findings: Rash present.     Comments: Erythematous papular and patchy rash on arms.    Neurological:     Mental Status: She is alert.      UC Treatments /  Results  Labs (all labs ordered are listed, but only abnormal results are displayed) Labs Reviewed - No data to display  EKG   Radiology No results found.  Procedures Procedures (including critical care time)  Medications Ordered in UC Medications - No data to display  Initial Impression / Assessment and Plan / UC Course  I have reviewed the triage vital signs and the nursing notes.  Pertinent labs & imaging results that were available during my care of the patient were reviewed by me and considered in my medical decision making (see chart for details).    Acute sinusitis, rash.  Afebrile and vital signs are stable.  Lungs are clear and O2 sat is 99% on room air.  Treating sinus infection with Augmentin.  Tylenol or ibuprofen as needed.  Plain Mucinex as  needed.  Education provided on sinus infection.  Treating pruritic rash with prednisone taper.  Instructed patient to take Zyrtec daily at bedtime.  Education provided on adult rash.  Instructed patient to follow-up with her PCP if her symptoms are not improving.  She agrees to plan of care.  Final Clinical Impressions(s) / UC Diagnoses   Final diagnoses:  Acute non-recurrent maxillary sinusitis  Rash     Discharge Instructions      Take the Augmentin and prednisone as directed.  Follow-up with your primary care provider if your symptoms are not improving.      ED Prescriptions     Medication Sig Dispense Auth. Provider   amoxicillin-clavulanate (AUGMENTIN) 875-125 MG tablet Take 1 tablet by mouth every 12 (twelve) hours. 14 tablet Mickie Bail, NP   predniSONE (STERAPRED UNI-PAK 21 TAB) 10 MG (21) TBPK tablet Take by mouth daily. As directed 21 tablet Mickie Bail, NP      PDMP not reviewed this encounter.   Mickie Bail, NP 01/20/24 [phone]

## 2024-01-20 NOTE — ED Triage Notes (Signed)
 Patient to Urgent Care with complaints of productive cough/ fatigue/ nasal congestion/ sinus pressure. Reports symptoms started two weeks ago- worsened over the last week. Taking daytime cold and flu otc.   Also w/ itchy rash on left forearm and bilateral elbows that started 10 days ago. Using otc cortisone cream and Triamcinolone.

## 2024-01-21 ENCOUNTER — Other Ambulatory Visit: Payer: Self-pay | Admitting: Family Medicine

## 2024-01-21 DIAGNOSIS — I10 Essential (primary) hypertension: Secondary | ICD-10-CM

## 2024-01-21 DIAGNOSIS — E559 Vitamin D deficiency, unspecified: Secondary | ICD-10-CM

## 2024-01-21 DIAGNOSIS — E785 Hyperlipidemia, unspecified: Secondary | ICD-10-CM

## 2024-01-21 DIAGNOSIS — R7309 Other abnormal glucose: Secondary | ICD-10-CM

## 2024-01-22 NOTE — Telephone Encounter (Signed)
 Sent mychart message to pt to let her know labs has been order, and she can call the office to get lab appointment scheduled.

## 2024-01-26 ENCOUNTER — Other Ambulatory Visit: Payer: Self-pay

## 2024-01-27 ENCOUNTER — Encounter: Payer: Self-pay | Admitting: Family Medicine

## 2024-01-27 ENCOUNTER — Ambulatory Visit (INDEPENDENT_AMBULATORY_CARE_PROVIDER_SITE_OTHER): Payer: Self-pay | Admitting: Family Medicine

## 2024-01-27 ENCOUNTER — Inpatient Hospital Stay: Payer: BC Managed Care – PPO | Attending: Oncology

## 2024-01-27 ENCOUNTER — Inpatient Hospital Stay (HOSPITAL_BASED_OUTPATIENT_CLINIC_OR_DEPARTMENT_OTHER): Payer: BC Managed Care – PPO | Admitting: Oncology

## 2024-01-27 VITALS — BP 122/88 | HR 100 | Temp 98.3°F | Resp 20 | Ht 67.0 in | Wt 184.5 lb

## 2024-01-27 DIAGNOSIS — E611 Iron deficiency: Secondary | ICD-10-CM | POA: Insufficient documentation

## 2024-01-27 DIAGNOSIS — Z8639 Personal history of other endocrine, nutritional and metabolic disease: Secondary | ICD-10-CM | POA: Diagnosis not present

## 2024-01-27 DIAGNOSIS — F39 Unspecified mood [affective] disorder: Secondary | ICD-10-CM

## 2024-01-27 DIAGNOSIS — R5383 Other fatigue: Secondary | ICD-10-CM | POA: Diagnosis not present

## 2024-01-27 DIAGNOSIS — E2839 Other primary ovarian failure: Secondary | ICD-10-CM

## 2024-01-27 DIAGNOSIS — L709 Acne, unspecified: Secondary | ICD-10-CM

## 2024-01-27 DIAGNOSIS — Z0000 Encounter for general adult medical examination without abnormal findings: Secondary | ICD-10-CM | POA: Diagnosis not present

## 2024-01-27 DIAGNOSIS — D75839 Thrombocytosis, unspecified: Secondary | ICD-10-CM | POA: Diagnosis not present

## 2024-01-27 DIAGNOSIS — R790 Abnormal level of blood mineral: Secondary | ICD-10-CM | POA: Diagnosis not present

## 2024-01-27 DIAGNOSIS — Z90710 Acquired absence of both cervix and uterus: Secondary | ICD-10-CM | POA: Diagnosis not present

## 2024-01-27 DIAGNOSIS — D8989 Other specified disorders involving the immune mechanism, not elsewhere classified: Secondary | ICD-10-CM

## 2024-01-27 DIAGNOSIS — E063 Autoimmune thyroiditis: Secondary | ICD-10-CM

## 2024-01-27 DIAGNOSIS — R21 Rash and other nonspecific skin eruption: Secondary | ICD-10-CM | POA: Diagnosis not present

## 2024-01-27 DIAGNOSIS — Z1231 Encounter for screening mammogram for malignant neoplasm of breast: Secondary | ICD-10-CM

## 2024-01-27 DIAGNOSIS — G479 Sleep disorder, unspecified: Secondary | ICD-10-CM

## 2024-01-27 DIAGNOSIS — M8580 Other specified disorders of bone density and structure, unspecified site: Secondary | ICD-10-CM

## 2024-01-27 DIAGNOSIS — I10 Essential (primary) hypertension: Secondary | ICD-10-CM

## 2024-01-27 DIAGNOSIS — E663 Overweight: Secondary | ICD-10-CM

## 2024-01-27 DIAGNOSIS — Z1211 Encounter for screening for malignant neoplasm of colon: Secondary | ICD-10-CM

## 2024-01-27 DIAGNOSIS — D72828 Other elevated white blood cell count: Secondary | ICD-10-CM

## 2024-01-27 LAB — IRON AND TIBC
Iron: 76 ug/dL (ref 28–170)
Saturation Ratios: 14 % (ref 10.4–31.8)
TIBC: 543 ug/dL — ABNORMAL HIGH (ref 250–450)
UIBC: 467 ug/dL

## 2024-01-27 LAB — CBC WITH DIFFERENTIAL/PLATELET
Abs Immature Granulocytes: 0.13 10*3/uL — ABNORMAL HIGH (ref 0.00–0.07)
Basophils Absolute: 0.1 10*3/uL (ref 0.0–0.1)
Basophils Relative: 1 %
Eosinophils Absolute: 0.3 10*3/uL (ref 0.0–0.5)
Eosinophils Relative: 2 %
HCT: 44.1 % (ref 36.0–46.0)
Hemoglobin: 14.5 g/dL (ref 12.0–15.0)
Immature Granulocytes: 1 %
Lymphocytes Relative: 29 %
Lymphs Abs: 3 10*3/uL (ref 0.7–4.0)
MCH: 29.3 pg (ref 26.0–34.0)
MCHC: 32.9 g/dL (ref 30.0–36.0)
MCV: 89.1 fL (ref 80.0–100.0)
Monocytes Absolute: 0.8 10*3/uL (ref 0.1–1.0)
Monocytes Relative: 8 %
Neutro Abs: 6.1 10*3/uL (ref 1.7–7.7)
Neutrophils Relative %: 59 %
Platelets: 452 10*3/uL — ABNORMAL HIGH (ref 150–400)
RBC: 4.95 MIL/uL (ref 3.87–5.11)
RDW: 13.8 % (ref 11.5–15.5)
WBC: 10.4 10*3/uL (ref 4.0–10.5)
nRBC: 0 % (ref 0.0–0.2)

## 2024-01-27 LAB — FERRITIN: Ferritin: 11 ng/mL (ref 11–307)

## 2024-01-27 MED ORDER — LOSARTAN POTASSIUM-HCTZ 50-12.5 MG PO TABS
1.0000 | ORAL_TABLET | Freq: Every morning | ORAL | 3 refills | Status: AC
Start: 2024-01-27 — End: ?

## 2024-01-27 MED ORDER — SULFACETAMIDE SODIUM (ACNE) 10 % EX LOTN: TOPICAL_LOTION | CUTANEOUS | 0 refills | Status: DC

## 2024-01-27 NOTE — Patient Instructions (Addendum)
 It was a pleasure meeting you today. Thank you for allowing me to take part in your health care.  Our goals for today as we discussed include:  Schedule fasting lab appointment.  Fast for 10 hours  Referral sent for Mammogram and Dexa scan. Please call to schedule appointment. [facility] [address] [place], [place] [address] [phone]    Referral sent for sleep studies  Look at [facility] website to see if this may be an option for you with your weight loss. [facility] [address] [place] [phone]    Refills sent for medications   Referral sent for colonoscopy  This is a list of the screening recommended for you and due dates:  Health Maintenance  Topic Date Due   Pneumococcal Vaccination (1 of 2 - PCV) Never done   Pap with HPV screening  04/16/2022   Colon Cancer Screening  Never done   COVID-19 Vaccine (4 - 2024-25 season) 07/12/2023   Mammogram  04/01/2024   DTaP/Tdap/Td vaccine (2 - Td or Tdap) 09/02/2028   Flu Shot  Completed   Hepatitis C Screening  Completed   HIV Screening  Completed   HPV Vaccine  Aged Out       If you have any questions or concerns, please do not hesitate to call the office at [phone].  I look forward to our next visit and until then take care and stay safe.  Regards,   Dana Allan, MD   [facility]

## 2024-01-27 NOTE — Progress Notes (Signed)
 SUBJECTIVE:   Chief Complaint  Patient presents with   Annual Exam   HPI Presents for annual physical  Discussed the use of AI scribe software for clinical note transcription with the patient, who gave verbal consent to proceed.  History of Present Illness Cynthia Burke "Cynthia Burke" is a 48 year old female who presents for an annual physical exam.  She is currently on a tapering dose of steroids and has gained approximately three pounds over the past five days. She is also on antibiotics due to frequent infections. She has a history of autoimmune thyroid disease, specifically Hashimoto's thyroiditis, and eczema, which she describes as scarring from previous rashes. Recently, she experienced a rash on her arm and has been dealing with frequent infections, including a sinus infection following a cold and norovirus. She is concerned about her immune system's ability to fight infections, as she frequently develops infections after dental procedures. There is a family history of autoimmune disorders, including rheumatoid arthritis and hypothyroidism in her mother and aunt, and hypothyroidism and type 2 diabetes in her father.  Her left eye has been tearing uncontrollably, which improved with antibiotics and steroids. She experiences spasms in her feet and legs at night, sometimes alleviated by drinking vinegar.  She describes intermittent joint pain, feeling like something is 'sticking' under her skin, particularly in her wrist, which is not affected by movement. Her eyes are often puffy, and she has been experiencing frequent infections, particularly after dental work, leading to gum infections.  She is currently taking estradiol, Zyrtec daily, Adderall, and has switched from Prozac to Viibryd. She is also on NP thyroid, transitioning to Armour thyroid due to insurance issues, and has previously been on levothyroxine. She reports her TSH levels were recently checked and were within normal  limits.  Her hands and feet get cold at night, and she experiences dry, cracking skin, which she attributes partially to caffeine.  She has a history of increased platelets, managed by her oncologist, Dr. Smith Robert. She also reports a history of dense breast tissue and a family history of breast cancer on her father's side, with concerns about breast cancer risk due to her early menopause and current estrogen use.       PERTINENT PMH / PSH: As above  OBJECTIVE:  BP 122/88   Pulse 100   Temp 98.3 F (36.8 C)   Resp 20   Ht 5\' 7"  (1.702 m)   Wt 184 lb 8 oz (83.7 kg)   LMP 09/10/2018   SpO2 99%   BMI 28.90 kg/m    Physical Exam Vitals reviewed.  Constitutional:      General: She is not in acute distress.    Appearance: She is not ill-appearing.  HENT:     Head: Normocephalic.     Right Ear: Tympanic membrane, ear canal and external ear normal.     Left Ear: Tympanic membrane, ear canal and external ear normal.     Nose: Nose normal.     Mouth/Throat:     Mouth: Mucous membranes are moist.  Eyes:     Extraocular Movements: Extraocular movements intact.     Conjunctiva/sclera: Conjunctivae normal.     Pupils: Pupils are equal, round, and reactive to light.  Neck:     Thyroid: No thyromegaly or thyroid tenderness.     Vascular: No carotid bruit.  Cardiovascular:     Rate and Rhythm: Normal rate and regular rhythm.     Pulses: Normal pulses.  Heart sounds: Normal heart sounds.  Pulmonary:     Effort: Pulmonary effort is normal.     Breath sounds: Normal breath sounds.  Abdominal:     General: Bowel sounds are normal. There is no distension.     Palpations: Abdomen is soft.     Tenderness: There is no abdominal tenderness. There is no right CVA tenderness, left CVA tenderness, guarding or rebound.  Musculoskeletal:        General: Normal range of motion.     Cervical back: Normal range of motion.     Right lower leg: No edema.     Left lower leg: No edema.   Lymphadenopathy:     Cervical: No cervical adenopathy.  Skin:    Capillary Refill: Capillary refill takes less than 2 seconds.  Neurological:     General: No focal deficit present.     Mental Status: She is alert and oriented to person, place, and time. Mental status is at baseline.     Motor: No weakness.  Psychiatric:        Mood and Affect: Mood normal.        Behavior: Behavior normal.        Thought Content: Thought content normal.        Judgment: Judgment normal.           01/27/2024   10:57 AM 09/03/2023    8:28 AM 01/08/2023    1:35 PM 12/01/2022    4:34 PM 10/08/2022    1:14 PM  Depression screen PHQ 2/9  Decreased Interest 3 1 1 1 2   Down, Depressed, Hopeless 2 3 1 1 1   PHQ - 2 Score 5 4 2 2 3   Altered sleeping 3 3 1 1 3   Tired, decreased energy 3 1 2 2 2   Change in appetite 3 0 0 0 0  Feeling bad or failure about yourself  2 1 1 1 1   Trouble concentrating 0 0 0 0 0  Moving slowly or fidgety/restless 0 0 0 0 0  Suicidal thoughts 0 0 0 0 0  PHQ-9 Score 16 9 6 6 9   Difficult doing work/chores Very difficult Very difficult Somewhat difficult Somewhat difficult Very difficult      01/27/2024   10:58 AM 09/03/2023    8:28 AM 12/01/2022    4:35 PM 10/08/2022    3:44 PM  GAD 7 : Generalized Anxiety Score  Nervous, Anxious, on Edge 1 1 1 1   Control/stop worrying 0 0 1 0  Worry too much - different things 2 1 0 1  Trouble relaxing 0 2 0 0  Restless 0 1 0 0  Easily annoyed or irritable 3 1 2 2   Afraid - awful might happen 1 2 1 2   Total GAD 7 Score 7 8 5 6   Anxiety Difficulty Very difficult Somewhat difficult Somewhat difficult Somewhat difficult    ASSESSMENT/PLAN:  Annual physical exam Assessment & Plan: Routine health maintenance includes mammogram and bone density screening. Family history of breast cancer and dense breast tissue may require additional imaging. Estradiol use increases breast cancer risk. - Order mammogram and bone density screening. -  Consider additional ultrasound if mammogram indicates dense breast tissue.    Rash -     ANA; Future -     C-reactive protein; Future -     Rheumatoid factor; Future -     Sedimentation rate; Future  Breast cancer screening by mammogram -     3D Screening Mammogram,  Left and Right; Future  Premature ovarian failure -     DG Bone Density; Future  Other fatigue -     Ambulatory referral to Pulmonology  Acne, unspecified acne type Assessment & Plan: Refill Sulfacetamide topical  Orders: -     Sulfacetamide Sodium (Acne); APPLY TOPICALLY TO THE AFFECTED AREA TWICE DAILY AS NEEDED  Dispense: 118 mL; Refill: 0  Primary hypertension Assessment & Plan: Chronic.  Stable.  Well-controlled.  Creatinine stable. -Check Cmet  -Refill Hyzaar 50-12.5 mg daily   Orders: -     Losartan Potassium-HCTZ; Take 1 tablet by mouth every morning.  Dispense: 90 tablet; Refill: 3  Colon cancer screening -     Ambulatory referral to Gastroenterology  Hypothyroidism due to Hashimoto's thyroiditis Assessment & Plan: Hashimoto's thyroiditis.  On NP Armour - Follows with Endocrinology    Mood disorder [facility]) Assessment & Plan: Patient expressed high levels of stress and interest in seeing a therapist. - Continue Vilazodone 40 mg daily -Continue to follow up with Psychiatry   Sleep difficulties Assessment & Plan: Reported concerns about sleep apnea. Sleep study recommended. -Epworth scale score 10 - Refer to pulmonology for sleep study evaluation.   Osteopenia, unspecified location Assessment & Plan: Recent bone density from 2023 normal. History of premature ovarian failure. -Dexa   Overweight (BMI 25.0-29.9) Assessment & Plan: Exploring GLP-1 agonists for weight loss. Interested in [facility] [employer] [facility] program for personalized care and insurance benefits. - Provide information on [facility] program. - Encourage program contact for further evaluation and  management. - Recently started   Autoimmune disorder (HCC) Assessment & Plan: Concern for multiple symptoms affection skin, joint, immunity.  Recently having more increase in sinus infections.  Requesting  autoimmune work up. Currently on steroid taper. - Initiate autoimmune workup post-steroid taper. - Consider rheumatology referral if autoimmune markers are positive.     PDMP reviewed  Return if symptoms worsen or fail to improve, for PCP.  Dana Allan, MD

## 2024-01-28 ENCOUNTER — Encounter: Payer: Self-pay | Admitting: Oncology

## 2024-01-29 NOTE — Telephone Encounter (Signed)
 Are they approving feraheme? If so we can give that option to the patient

## 2024-01-29 NOTE — Telephone Encounter (Signed)
 Who can tell her how much iron infusion will cost? Can billing/ Ezra Sites help? I dont have these answers

## 2024-01-31 ENCOUNTER — Encounter: Payer: Self-pay | Admitting: Oncology

## 2024-01-31 NOTE — Progress Notes (Signed)
 I connected with Cynthia Burke on 01/31/24 at  3:15 PM EDT by video enabled telemedicine visit and verified that I am speaking with the correct person using two identifiers.   I discussed the limitations, risks, security and privacy concerns of performing an evaluation and management service by telemedicine and the availability of in-person appointments. I also discussed with the patient that there may be a patient responsible charge related to this service. The patient expressed understanding and agreed to proceed.  Other persons participating in the visit and their role in the encounter:  none  Patient's location:  home Provider's location:  work  Stage manager Complaint:  discuss results of bloodwork  History of present illness: Patient is a 48 year old female referred for thrombocytosis.  Her most recent CBC from 11/25/2021 showed a white count of 4, H&H of 13/39.3 and a platelet count of 423.  Her prior platelet count in 2019 was normal at 270.  Platelet counts between October and December 2019 have been normal except for 1 occasion admitted as 409.no prior h/o thrombosis.  She is s/p hysterectomy.  Ferritin levels have been low without overt anemia in the past    Interval history overall patient does not feel Well and reports ongoing fatigue.  She was also seen by her primary care provider and is undergoing workup for possible autoimmune causes   Review of Systems  Constitutional:  Positive for malaise/fatigue. Negative for chills, fever and weight loss.  HENT:  Negative for congestion, ear discharge and nosebleeds.   Eyes:  Negative for blurred vision.  Respiratory:  Negative for cough, hemoptysis, sputum production, shortness of breath and wheezing.   Cardiovascular:  Negative for chest pain, palpitations, orthopnea and claudication.  Gastrointestinal:  Negative for abdominal pain, blood in stool, constipation, diarrhea, heartburn, melena, nausea and vomiting.  Genitourinary:   Negative for dysuria, flank pain, frequency, hematuria and urgency.  Musculoskeletal:  Negative for back pain, joint pain and myalgias.  Skin:  Negative for rash.  Neurological:  Negative for dizziness, tingling, focal weakness, seizures, weakness and headaches.  Endo/Heme/Allergies:  Does not bruise/bleed easily.  Psychiatric/Behavioral:  Negative for depression and suicidal ideas. The patient does not have insomnia.     Allergies  Allergen Reactions   Amlodipine     Swelling  Other reaction(s): Other (See Comments) Swelling Swelling   Bupropion Other (See Comments)    Seizures   Other reaction(s): Other (See Comments), Other (See Comments), Other (See Comments) Seizures  seizure Seizures    Spironolactone     ?eyelid swelling worse at 50mg  dose   Latex Other (See Comments) and Hives    Itching, sneezing Other reaction(s): Other (See Comments) Itching, sneezing Itching, sneezing Itching, sneezing  Itching, sneezing   Other Other (See Comments)    Tree pollen   Other reaction(s): Other (See Comments), Other (See Comments) Tree pollen  unknown Tree pollen     Past Medical History:  Diagnosis Date   ADD (attention deficit disorder)    Allergy    Anemia    Anemia 12/21/2018   Annual physical exam 06/03/2019   Anxiety and depression 09/02/2018   Arthralgia 03/26/2020   Formatting of this note might be different from the original.  April 2021     Last Assessment & Plan:   Formatting of this note might be different from the original.  Work-up April 2020: normal Lyme/RMSF/ANA/Celiac panel /CK   Asthma    BMI 28.0-28.9,adult 07/04/2022   COVID-19    Depression  Hashimoto's disease    Hashimoto's thyroiditis    History of chicken pox    History of UTI    HLD (hyperlipidemia) 09/02/2018   Hyperlipidemia    Need for hepatitis B screening test 03/26/2020   Premature ovarian insufficiency    [facility] 29 Dr. Daiva Nakayama    Rash 03/08/2020   Seizures (HCC)     medication induced wellbutrin   Thumb pain, left 09/02/2018    Past Surgical History:  Procedure Laterality Date   ABDOMINAL HYSTERECTOMY     Dr. Doroteo Glassman 11-04-18   DILATION AND CURETTAGE OF UTERUS     2015 and 2018    HYSTERECTOMY ABDOMINAL WITH SALPINGECTOMY Bilateral 11/04/2018   Procedure: HYSTERECTOMY ABDOMINAL WITH BILATERAL SALPINGECTOMY, Exploratory laparotomy;  Surgeon: Shonna Chock, MD;  Location: [facility];  Service: Gynecology;  Laterality: Bilateral;   MYOMECTOMY     2015     Social History   Socioeconomic History   Marital status: Married    Spouse name: Not on file   Number of children: Not on file   Years of education: Not on file   Highest education level: Not on file  Occupational History   Not on file  Tobacco Use   Smoking status: Never   Smokeless tobacco: Never  Vaping Use   Vaping status: Never Used  Substance and Sexual Activity   Alcohol use: No   Drug use: No   Sexual activity: Yes    Birth control/protection: None  Other Topics Concern   Not on file  Social History Narrative   Married    PT   No kids has pets   [employer]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    Social Drivers of Health   Financial Resource Strain: Not on file  Food Insecurity: Not on file  Transportation Needs: Not on file  Physical Activity: Not on file  Stress: Not on file  Social Connections: Not on file  Intimate Partner Violence: Not on file    Family History  Problem Relation Age of Onset   Rheum arthritis Mother    Hashimoto's thyroiditis Mother    Hypertension Mother    Hyperlipidemia Mother    Arthritis Mother    Asthma Mother    Depression Mother    Kidney disease Mother    Miscarriages / [place] Mother    Memory loss Mother    Diabetes Mellitus II Father    Hypothyroidism Father    Hypertension Father    Coronary artery disease Father    Hyperlipidemia Father    Arthritis Father    COPD Father    Depression Father     Diabetes Father    Heart disease Father    Kidney disease Father    Bladder Cancer Father    Prostate cancer Father    Peripheral Artery Disease Father    Learning disabilities Brother    Hypertension Brother    Depression Brother    Stroke Maternal Grandmother    Heart disease Maternal Grandmother    Alcohol abuse Maternal Grandfather    Early death Maternal Grandfather    Breast cancer Paternal Grandmother 8   Early death Paternal Grandmother    Cancer Paternal Grandmother        breast died    Miscarriages / Stillbirths Paternal Grandmother    Heart disease Paternal Grandfather      Current Outpatient Medications:    albuterol (VENTOLIN HFA) 108 (90 Base) MCG/ACT inhaler, Inhale 1-2  puffs into the lungs every 6 (six) hours as needed for wheezing or shortness of breath., Disp: 1 g, Rfl: 12   Amphet-Dextroamphet 3-Bead ER (MYDAYIS) 50 MG CP24, Take 50 mg by mouth daily., Disp: , Rfl:    Calcium Carbonate-Vit D-Min (CALTRATE 600+D PLUS PO), Take 4 capsules by mouth daily., Disp: , Rfl:    Cetirizine HCl (ZYRTEC PO), Take 1 tablet by mouth daily., Disp: , Rfl:    estradiol (ESTRACE) 0.1 MG/GM vaginal cream, Place vaginally., Disp: , Rfl:    estradiol (ESTRACE) 1 MG tablet, Take 1 mg by mouth daily., Disp: , Rfl:    linaclotide (LINZESS) 145 MCG CAPS capsule, Take 1 capsule (145 mcg total) by mouth daily before breakfast., Disp: 90 capsule, Rfl: 3   losartan-hydrochlorothiazide (HYZAAR) 50-12.5 MG tablet, Take 1 tablet by mouth every morning., Disp: 90 tablet, Rfl: 3   Probiotic, Lactobacillus, CAPS, , Disp: , Rfl:    Sulfacetamide Sodium, Acne, 10 % LOTN, APPLY TOPICALLY TO THE AFFECTED AREA TWICE DAILY AS NEEDED, Disp: 118 mL, Rfl: 0   thyroid (ARMOUR) 90 MG tablet, Take one tablet SIX days per week. Take two tablets on Sundays., Disp: , Rfl:    Vilazodone HCl (VIIBRYD) 40 MG TABS, Take 40 mg by mouth daily., Disp: , Rfl:   No results found.  No images are attached to the  encounter.      Latest Ref Rng & Units 01/01/2023    9:41 AM  CMP  Glucose 70 - 99 mg/dL 88   BUN 6 - 23 mg/dL 14   Creatinine 6.04 - 1.20 mg/dL 5.40   Sodium 981 - 191 mEq/L 136   Potassium 3.5 - 5.1 mEq/L 3.7   Chloride 96 - 112 mEq/L 99   CO2 19 - 32 mEq/L 29   Calcium 8.4 - 10.5 mg/dL 9.7   Total Protein 6.0 - 8.3 g/dL 6.9   Total Bilirubin 0.2 - 1.2 mg/dL 0.4   Alkaline Phos 39 - 117 U/L 47   AST 0 - 37 U/L 16   ALT 0 - 35 U/L 14       Latest Ref Rng & Units 01/27/2024    1:57 PM  CBC  WBC 4.0 - 10.5 K/uL 10.4   Hemoglobin 12.0 - 15.0 g/dL 47.8   Hematocrit 29.5 - 46.0 % 44.1   Platelets 150 - 400 K/uL 452      Observation/objective: Appears in no acute distress over video visit today breathing is nonlabored  Assessment and plan: Patient is a 48 year old female and this is a routine follow-up visit for following issues:   Thrombocytosis: Patient has had mild intermittent thrombocytosis with platelet counts mostly in the 300s but occasionally in the low to mid 400s without a clear rising trend.  This is likely reactive.  She does have evidence of chronic iron deficiency given that her ferritin levels over the last 3 years have remained less than 30.  Patient did not have this kind of fatigue however 3 years ago.  Given her ongoing symptoms we did discuss trial of IV iron to see if it makes her feel better.  Based on her insurance we will have to see what they will approve and once the co-pay involved and patient will let us know if she would like to proceed with IV iron.  CBC ferritin and iron studies in 3 and 6 months and I will see her back in 6 months.  I will check CALR and MPL JAK2  and BCR-ABL FISH testing in 3 months although I feel that her thrombocytosis is reactive.  Even if she was found to have possible myeloproliferative neoplasm she would not require any additional medication such as Hydrea given that she is less than 66 years of age and has not had any prior  episodes of thrombosis.  Follow-up instructions:as above   I discussed the assessment and treatment plan with the patient. The patient was provided an opportunity to ask questions and all were answered. The patient agreed with the plan and demonstrated an understanding of the instructions.   The patient was advised to call back or seek an in-person evaluation if the symptoms worsen or if the condition fails to improve as anticipated.  I provided 12 minutes of face-to-face video visit time during this encounter, and > 50% was spent counseling as documented under my assessment & plan.  Visit Diagnosis: 1. Low ferritin   2. Thrombocytosis   3. Neutrophilia     Dr. Owens Shark, MD, MPH [facility] Tel- [phone] 01/31/2024 10:47 AM

## 2024-02-01 ENCOUNTER — Other Ambulatory Visit: Payer: Self-pay | Admitting: Oncology

## 2024-02-01 ENCOUNTER — Telehealth: Payer: Self-pay | Admitting: *Deleted

## 2024-02-01 ENCOUNTER — Encounter: Payer: Self-pay | Admitting: Family Medicine

## 2024-02-01 DIAGNOSIS — D508 Other iron deficiency anemias: Secondary | ICD-10-CM

## 2024-02-01 DIAGNOSIS — G479 Sleep disorder, unspecified: Secondary | ICD-10-CM | POA: Insufficient documentation

## 2024-02-01 DIAGNOSIS — D8989 Other specified disorders involving the immune mechanism, not elsewhere classified: Secondary | ICD-10-CM | POA: Insufficient documentation

## 2024-02-01 DIAGNOSIS — R21 Rash and other nonspecific skin eruption: Secondary | ICD-10-CM | POA: Insufficient documentation

## 2024-02-01 DIAGNOSIS — R5383 Other fatigue: Secondary | ICD-10-CM | POA: Insufficient documentation

## 2024-02-01 DIAGNOSIS — D509 Iron deficiency anemia, unspecified: Secondary | ICD-10-CM | POA: Insufficient documentation

## 2024-02-01 DIAGNOSIS — Z1211 Encounter for screening for malignant neoplasm of colon: Secondary | ICD-10-CM | POA: Insufficient documentation

## 2024-02-01 NOTE — Assessment & Plan Note (Signed)
 Chronic.  Currently on Adderall and tolerating well.  Vyvanse was switched Follows with Dr. Evelene Croon Cont

## 2024-02-01 NOTE — Telephone Encounter (Signed)
 pt is on the sch tom for venofer. but after pt considered pricing for the venofer, pt preferred the fereheme x 2 doses. insurance approves either venofer or fereheme. Pt still has an 1800/ deductible yet to meet per Ezra Sites. dr. Smith Robert just put the orders in for the fereheme. I spoke with megan in scheduling. We will update pt's treatment plan to fereheme for tomorrow. Cnl 3/5 venofer treatments- given the change to fereheme. Pt sent mychart msg to followup on this plan. I also left a detailed vm for patient as well.

## 2024-02-01 NOTE — Assessment & Plan Note (Addendum)
 Hashimoto's thyroiditis.  On NP Armour - Follows with Endocrinology

## 2024-02-01 NOTE — Assessment & Plan Note (Signed)
 Refill Sulfacetamide topical

## 2024-02-01 NOTE — Assessment & Plan Note (Signed)
 Reported concerns about sleep apnea. Sleep study recommended. -Epworth scale score 10 - Refer to pulmonology for sleep study evaluation.

## 2024-02-01 NOTE — Assessment & Plan Note (Signed)
 Exploring GLP-1 agonists for weight loss. Interested in [facility] [employer] [facility] program for personalized care and insurance benefits. - Provide information on [facility] program. - Encourage program contact for further evaluation and management. - Recently started

## 2024-02-01 NOTE — Assessment & Plan Note (Signed)
 Recent bone density from 2023 normal. History of premature ovarian failure. -Dexa

## 2024-02-01 NOTE — Assessment & Plan Note (Signed)
 Concern for multiple symptoms affection skin, joint, immunity.  Recently having more increase in sinus infections.  Requesting  autoimmune work up. Currently on steroid taper. - Initiate autoimmune workup post-steroid taper. - Consider rheumatology referral if autoimmune markers are positive.

## 2024-02-01 NOTE — Assessment & Plan Note (Signed)
 Chronic.  Stable.  Well-controlled.  Creatinine stable. -Check Cmet  -Refill Hyzaar 50-12.5 mg daily

## 2024-02-01 NOTE — Assessment & Plan Note (Signed)
 Routine health maintenance includes mammogram and bone density screening. Family history of breast cancer and dense breast tissue may require additional imaging. Estradiol use increases breast cancer risk. - Order mammogram and bone density screening. - Consider additional ultrasound if mammogram indicates dense breast tissue.

## 2024-02-01 NOTE — Assessment & Plan Note (Signed)
 Patient expressed high levels of stress and interest in seeing a therapist. - Continue Vilazodone 40 mg daily -Continue to follow up with Psychiatry

## 2024-02-02 ENCOUNTER — Inpatient Hospital Stay

## 2024-02-02 VITALS — BP 115/77 | HR 74 | Temp 98.1°F | Resp 18

## 2024-02-02 DIAGNOSIS — E611 Iron deficiency: Secondary | ICD-10-CM | POA: Diagnosis not present

## 2024-02-02 DIAGNOSIS — D508 Other iron deficiency anemias: Secondary | ICD-10-CM

## 2024-02-02 MED ORDER — SODIUM CHLORIDE 0.9 % IV SOLN
INTRAVENOUS | Status: DC
  Filled 2024-02-02: qty 250

## 2024-02-02 MED ORDER — SODIUM CHLORIDE 0.9 % IV SOLN
510.0000 mg | INTRAVENOUS | Status: DC
  Administered 2024-02-02: 510 mg via INTRAVENOUS
  Filled 2024-02-02: qty 510

## 2024-02-02 NOTE — Patient Instructions (Signed)

## 2024-02-05 ENCOUNTER — Inpatient Hospital Stay

## 2024-02-08 ENCOUNTER — Inpatient Hospital Stay

## 2024-02-08 VITALS — BP 105/76 | HR 86 | Temp 97.8°F | Resp 18

## 2024-02-08 DIAGNOSIS — D508 Other iron deficiency anemias: Secondary | ICD-10-CM

## 2024-02-08 DIAGNOSIS — E611 Iron deficiency: Secondary | ICD-10-CM | POA: Diagnosis not present

## 2024-02-08 MED ORDER — ACETAMINOPHEN 325 MG PO TABS
650.0000 mg | ORAL_TABLET | Freq: Once | ORAL | Status: AC
  Administered 2024-02-08: 650 mg via ORAL

## 2024-02-08 MED ORDER — SODIUM CHLORIDE 0.9 % IV SOLN
510.0000 mg | INTRAVENOUS | Status: DC
  Administered 2024-02-08: 510 mg via INTRAVENOUS
  Filled 2024-02-08: qty 510

## 2024-02-11 ENCOUNTER — Other Ambulatory Visit (INDEPENDENT_AMBULATORY_CARE_PROVIDER_SITE_OTHER)

## 2024-02-11 DIAGNOSIS — I10 Essential (primary) hypertension: Secondary | ICD-10-CM | POA: Diagnosis not present

## 2024-02-11 DIAGNOSIS — R7309 Other abnormal glucose: Secondary | ICD-10-CM | POA: Diagnosis not present

## 2024-02-11 DIAGNOSIS — E559 Vitamin D deficiency, unspecified: Secondary | ICD-10-CM

## 2024-02-11 DIAGNOSIS — E785 Hyperlipidemia, unspecified: Secondary | ICD-10-CM | POA: Diagnosis not present

## 2024-02-11 DIAGNOSIS — R21 Rash and other nonspecific skin eruption: Secondary | ICD-10-CM

## 2024-02-11 LAB — COMPREHENSIVE METABOLIC PANEL WITH GFR
ALT: 22 U/L (ref 0–35)
AST: 22 U/L (ref 0–37)
Albumin: 4.4 g/dL (ref 3.5–5.2)
Alkaline Phosphatase: 51 U/L (ref 39–117)
BUN: 14 mg/dL (ref 6–23)
CO2: 28 meq/L (ref 19–32)
Calcium: 9.5 mg/dL (ref 8.4–10.5)
Chloride: 103 meq/L (ref 96–112)
Creatinine, Ser: 1 mg/dL (ref 0.40–1.20)
GFR: 67.8 mL/min (ref >60.00)
Glucose, Bld: 93 mg/dL (ref 70–99)
Potassium: 3.6 meq/L (ref 3.5–5.1)
Sodium: 137 meq/L (ref 135–145)
Total Bilirubin: 0.5 mg/dL (ref 0.2–1.2)
Total Protein: 7.2 g/dL (ref 6.0–8.3)

## 2024-02-11 LAB — LIPID PANEL
Cholesterol: 205 mg/dL — ABNORMAL HIGH (ref 0–200)
HDL: 49.7 mg/dL (ref >39.00)
LDL Cholesterol: 126 mg/dL — ABNORMAL HIGH (ref 0–99)
NonHDL: 155.01
Total CHOL/HDL Ratio: 4
Triglycerides: 146 mg/dL (ref 0.0–149.0)
VLDL: 29.2 mg/dL (ref 0.0–40.0)

## 2024-02-11 LAB — SEDIMENTATION RATE: Sed Rate: 7 mm/h (ref 0–20)

## 2024-02-11 LAB — C-REACTIVE PROTEIN: CRP: <1 mg/dL (ref 0.5–20.0)

## 2024-02-11 LAB — VITAMIN D 25 HYDROXY (VIT D DEFICIENCY, FRACTURES): VITD: 37.86 ng/mL (ref 30.00–100.00)

## 2024-02-11 LAB — HEMOGLOBIN A1C: Hgb A1c MFr Bld: 6 % (ref 4.6–6.5)

## 2024-02-12 ENCOUNTER — Inpatient Hospital Stay

## 2024-02-13 LAB — ANA: Anti Nuclear Antibody (ANA): POSITIVE — AB

## 2024-02-13 LAB — ANTI-NUCLEAR AB-TITER (ANA TITER): ANA Titer 1: 1:80 {titer} — ABNORMAL HIGH

## 2024-02-13 LAB — RHEUMATOID FACTOR: Rheumatoid fact SerPl-aCnc: <10 [IU]/mL (ref <14)

## 2024-02-15 ENCOUNTER — Inpatient Hospital Stay

## 2024-02-15 ENCOUNTER — Other Ambulatory Visit: Payer: Self-pay | Admitting: Family Medicine

## 2024-02-15 DIAGNOSIS — M2550 Pain in unspecified joint: Secondary | ICD-10-CM

## 2024-02-15 DIAGNOSIS — D8989 Other specified disorders involving the immune mechanism, not elsewhere classified: Secondary | ICD-10-CM

## 2024-02-15 DIAGNOSIS — R768 Other specified abnormal immunological findings in serum: Secondary | ICD-10-CM

## 2024-02-15 DIAGNOSIS — R21 Rash and other nonspecific skin eruption: Secondary | ICD-10-CM

## 2024-02-16 NOTE — Progress Notes (Signed)
 Noted.

## 2024-02-24 ENCOUNTER — Ambulatory Visit: Admitting: Nurse Practitioner

## 2024-02-24 ENCOUNTER — Encounter: Payer: Self-pay | Admitting: Nurse Practitioner

## 2024-02-24 VITALS — BP 104/64 | HR 99 | Temp 98.8°F | Ht 67.0 in | Wt 185.0 lb

## 2024-02-24 DIAGNOSIS — G4719 Other hypersomnia: Secondary | ICD-10-CM | POA: Diagnosis not present

## 2024-02-24 DIAGNOSIS — J4520 Mild intermittent asthma, uncomplicated: Secondary | ICD-10-CM | POA: Diagnosis not present

## 2024-02-24 DIAGNOSIS — R0683 Snoring: Secondary | ICD-10-CM

## 2024-02-24 NOTE — Assessment & Plan Note (Signed)
 Intermittent. Managed by PCP. Infrequent SABA use. Continue PRN SABA use.

## 2024-02-24 NOTE — Patient Instructions (Signed)
 Given your symptoms, I am concerned that you may have sleep disordered breathing with sleep apnea. You will need a sleep study for further evaluation. Someone will contact you to schedule this.   We discussed how untreated sleep apnea puts an individual at risk for cardiac arrhthymias, pulm HTN, DM, stroke and increases their risk for daytime accidents. We also briefly reviewed treatment options including weight loss, side sleeping position, oral appliance, CPAP therapy or referral to ENT for possible surgical options  Use caution when driving and pull over if you become sleepy.  Follow up in 6 weeks with Katie Emmanuell Kantz,NP to go over sleep study results, or sooner, if needed. Friday PM virtual clinic preferred

## 2024-02-24 NOTE — Assessment & Plan Note (Signed)
 See above

## 2024-02-24 NOTE — Assessment & Plan Note (Addendum)
 She has snoring, excessive daytime sleepiness, restless sleep, morning headaches. BMI 28. Given this,  I am concerned she could have sleep disordered breathing with obstructive sleep apnea. She will need sleep study for further evaluation.    - discussed how weight can impact sleep and risk for sleep disordered breathing - discussed options to assist with weight loss: combination of diet modification, cardiovascular and strength training exercises   - had an extensive discussion regarding the adverse health consequences related to untreated sleep disordered breathing - specifically discussed the risks for hypertension, coronary artery disease, cardiac dysrhythmias, cerebrovascular disease, and diabetes - lifestyle modification discussed   - discussed how sleep disruption can increase risk of accidents, particularly when driving - safe driving practices were discussed  Suspect fatigue is multifactorial related to deconditioning, hypothyroid, post menopausal, etc. If no significant sleep disordered breathing, could trial sleep aid to see if she has any perceived benefit with more restorative sleep. Will reassess at follow up.  Patient Instructions  Given your symptoms, I am concerned that you may have sleep disordered breathing with sleep apnea. You will need a sleep study for further evaluation. Someone will contact you to schedule this.   We discussed how untreated sleep apnea puts an individual at risk for cardiac arrhthymias, pulm HTN, DM, stroke and increases their risk for daytime accidents. We also briefly reviewed treatment options including weight loss, side sleeping position, oral appliance, CPAP therapy or referral to ENT for possible surgical options  Use caution when driving and pull over if you become sleepy.  Follow up in 6 weeks with Katie Bowen Kia,NP to go over sleep study results, or sooner, if needed. Friday PM virtual clinic preferred

## 2024-02-24 NOTE — Progress Notes (Signed)
 @Patient  ID: Hezzie Bump, female    DOB: 03-Sep-1976, 48 y.o.   MRN: [id number]  Chief Complaint  Patient presents with   Consult    Snoring, feeling fatigue after 6-8 hours sleep, restless sleep, waking every 2-3 hours during night.    Referring provider: Dana Allan, MD  HPI: 48 year old female, never smoker referred for sleep consult. Past medical history significant for HTN, allergic rhinitis, asthma, IBS, hypothyroid, ADHD, HLD, prediabetes.   TEST/EVENTS:   02/24/2024: Today - sleep consult Discussed the use of AI scribe software for clinical note transcription with the patient, who gave verbal consent to proceed.  History of Present Illness   Cory Rama "Marisue Ivan" is a 48 year old female who presents for a sleep consult due to chronic fatigue and snoring.  She has experienced chronic fatigue and snoring for over ten years. Despite obtaining a full night's sleep, she feels persistently tired and wakes up at least once during the night, often to use the bathroom or due to hunger. No frequent choking or gasping for air upon waking, and morning headaches are rare. She has experienced sleep paralysis very rarely, about two or three times in her life. She has tried various sleep aids in the past, including hydroxyzine, melatonin and trazodone, but they were either ineffective or caused morning hangover. She did briefly try [place] but does not recall exact response to this. No history of sleep walking. She does not currently take any sleep medications.  She has a history of Hashimoto's thyroiditis and reports feeling better on Armour thyroid compared to Synthroid. Her TSH was last checked in February and within normal limits. She sees an endocrinologist every three to six months for monitoring.  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 She is on hormone replacement therapy with  estrogen. She has discussed the possibility of adding testosterone with her gynecologist but is concerned about the associated risks.  She has a family history of mother with pulmonary hypertension. Her mother does have asthma but no other cardiac or pulmonary diseases that she is aware of.   She was diagnosed with asthma at age 51, which is mostly triggered by illness. She uses albuterol infrequently, about once a month. No wheezing, cough, congestion.   She has a history of high cholesterol since age 9, which is likely familial. She has had a positive ANA test recently, but other inflammatory markers have been negative. She is awaiting workup with rheumatology.   She has been diagnosed with ADHD and is on medication for it.   She consumes caffeine, equivalent to two to three cups of coffee a day, and tries to limit her intake to 200-300 mg per day. She does not consume alcohol.  She recently started on semaglutide for weight management as well as focusing on a meal plan and exercise program, though she finds it challenging to adhere to due to fatigue and her work schedule as a physical therapist.   She's never had a sleep study before. She goes to bed around 10pm-midnight. Falls asleep within an hour. Gets up 1-2 times to use the restroom. Wakes up around 7 am. Does not operate any heavy machinery in her job Animal nutritionist.   [place] [address]      Allergies  Allergen Reactions   Amlodipine     Swelling  Other reaction(s): Other (See Comments) Swelling Swelling   Bupropion Other (See Comments)  Seizures   Other reaction(s): Other (See Comments), Other (See Comments), Other (See Comments) Seizures  seizure Seizures    Spironolactone     ?eyelid swelling worse at 50mg  dose   Latex Other (See Comments) and Hives    Itching, sneezing Other reaction(s): Other (See Comments) Itching, sneezing Itching, sneezing Itching, sneezing  Itching, sneezing   Other Other (See Comments)    Tree  pollen   Other reaction(s): Other (See Comments), Other (See Comments) Tree pollen  unknown Tree pollen     Immunization History  Administered Date(s) Administered   Influenza, Seasonal, Injecte, Preservative Fre 09/04/2023   Influenza,inj,Quad PF,6+ Mos 10/27/2017, 09/02/2018, 10/31/2021, 07/04/2022   Influenza-Unspecified 10/27/2017, 10/27/2017   Moderna SARS-COV2 Booster Vaccination 06/13/2021   PFIZER(Purple Top)SARS-COV-2 Vaccination 03/03/2020, 03/30/2020   PPD Test 06/26/2021   Td (Adult),5 Lf Tetanus Toxid, Preservative Free 05/26/2016   Tdap 09/02/2018    Past Medical History:  Diagnosis Date   ADD (attention deficit disorder)    Allergy    Anemia    Anemia 12/21/2018   Annual physical exam 06/03/2019   Anxiety and depression 09/02/2018   Arthralgia 03/26/2020   Formatting of this note might be different from the original.  April 2021     Last Assessment & Plan:   Formatting of this note might be different from the original.  Work-up April 2020: normal Lyme/RMSF/ANA/Celiac panel /CK   Asthma    BMI 28.0-28.9,adult 07/04/2022   COVID-19    Depression    Hashimoto's disease    Hashimoto's thyroiditis    History of chicken pox    History of UTI    HLD (hyperlipidemia) 09/02/2018   Hyperlipidemia    Need for hepatitis B screening test 03/26/2020   Premature ovarian insufficiency    [facility] 29 Dr. Daiva Nakayama    Rash 03/08/2020   Seizures (HCC)    medication induced wellbutrin   Thumb pain, left 09/02/2018    Tobacco History: Social History   Tobacco Use  Smoking Status Never  Smokeless Tobacco Never   Counseling given: Not Answered   Outpatient Medications Prior to Visit  Medication Sig Dispense Refill   albuterol (VENTOLIN HFA) 108 (90 Base) MCG/ACT inhaler Inhale 1-2 puffs into the lungs every 6 (six) hours as needed for wheezing or shortness of breath. 1 g 12   Amphet-Dextroamphet 3-Bead ER (MYDAYIS) 50 MG CP24 Take 50 mg by mouth daily.     Calcium  Carbonate-Vit D-Min (CALTRATE 600+D PLUS PO) Take 4 capsules by mouth daily.     Cetirizine HCl (ZYRTEC PO) Take 1 tablet by mouth daily.     estradiol (ESTRACE) 0.1 MG/GM vaginal cream Place vaginally.     estradiol (ESTRACE) 1 MG tablet Take 1 mg by mouth daily.     fluticasone (FLONASE) 50 MCG/ACT nasal spray Place 2 sprays into both nostrils daily.     linaclotide (LINZESS) 145 MCG CAPS capsule Take 1 capsule (145 mcg total) by mouth daily before breakfast. 90 capsule 3   losartan-hydrochlorothiazide (HYZAAR) 50-12.5 MG tablet Take 1 tablet by mouth every morning. 90 tablet 3   Omega-3 Fatty Acids (OMEGA-3 PO) Take by mouth.     Probiotic, Lactobacillus, CAPS      Sulfacetamide Sodium, Acne, 10 % LOTN APPLY TOPICALLY TO THE AFFECTED AREA TWICE DAILY AS NEEDED 118 mL 0   thyroid (ARMOUR) 90 MG tablet Take one tablet SIX days per week. Take two tablets on Sundays.     Vilazodone HCl (VIIBRYD) 40 MG TABS  Take 40 mg by mouth daily.     No facility-administered medications prior to visit.     Review of Systems:   Constitutional: No night sweats, fevers, chills, or lassitude. +weight gain, fatigue  HEENT: No difficulty swallowing, tooth/dental problems, or sore throat. +seasonal allergies, occasional headaches CV:  No chest pain, orthopnea, PND, swelling in lower extremities, anasarca, dizziness, palpitations, syncope Resp: +snoring, minimal shortness of breath with exertion. No excess mucus or change in color of mucus. No productive or non-productive. No hemoptysis. No wheezing.  No chest wall deformity GI:  +occasional heartburn, indigestion. No abdominal pain, nausea, vomiting, diarrhea, change in bowel habits, loss of appetite, bloody stools.  GU: No dysuria, change in color of urine, urgency or frequency.  No flank pain, no hematuria  Skin: +rash (awaiting rheumatology workup) MSK:  +joint pains Neuro: No dizziness or lightheadedness.  Psych: +stable depression, anxiety. No SI/HI. Mood  stable. +sleep disturbance    Physical Exam:  BP 104/64 (BP Location: Right Arm, Patient Position: Sitting, Cuff Size: Normal)   Pulse 99   Temp 98.8 F (37.1 C) (Oral)   Ht 5\' 7"  (1.702 m)   Wt 185 lb (83.9 kg)   LMP 09/10/2018   SpO2 97%   BMI 28.98 kg/m   GEN: Pleasant, interactive, well-appearing; in no acute distress HEENT:  Normocephalic and atraumatic. PERRLA. Sclera white. Nasal turbinates pink, moist and patent bilaterally. No rhinorrhea present. Oropharynx pink and moist, without exudate or edema. No lesions, ulcerations, or postnasal drip. Mallampati II/III NECK:  Supple w/ fair ROM. No JVD present. Normal carotid impulses w/o bruits. Thyroid symmetrical with no goiter or nodules palpated. No lymphadenopathy.   CV: RRR, no m/r/g, no peripheral edema. Pulses intact, +2 bilaterally. No cyanosis, pallor or clubbing. PULMONARY:  Unlabored, regular breathing. Clear bilaterally A&P w/o wheezes/rales/rhonchi. No accessory muscle use.  GI: BS present and normoactive. Soft, non-tender to palpation. No organomegaly or masses detected.  MSK: No erythema, warmth or tenderness. Cap refil <2 sec all extrem. No deformities or joint swelling noted.  Neuro: A/Ox3. No focal deficits noted.   Skin: Warm, no lesions or rashe Psych: Normal affect and behavior. Judgement and thought content appropriate.     Lab Results:  CBC    Component Value Date/Time   WBC 10.4 01/27/2024 1357   RBC 4.95 01/27/2024 1357   HGB 14.5 01/27/2024 1357   HGB 11.6 (L) 10/28/2018 1031   HCT 44.1 01/27/2024 1357   PLT 452 (H) 01/27/2024 1357   PLT 372 10/28/2018 1031   MCV 89.1 01/27/2024 1357   MCH 29.3 01/27/2024 1357   MCHC 32.9 01/27/2024 1357   RDW 13.8 01/27/2024 1357   LYMPHSABS 3.0 01/27/2024 1357   MONOABS 0.8 01/27/2024 1357   EOSABS 0.3 01/27/2024 1357   BASOSABS 0.1 01/27/2024 1357    BMET    Component Value Date/Time   NA 137 02/11/2024 0934   K 3.6 02/11/2024 0934   CL 103  02/11/2024 0934   CO2 28 02/11/2024 0934   GLUCOSE 93 02/11/2024 0934   BUN 14 02/11/2024 0934   CREATININE 1.00 02/11/2024 0934   CALCIUM 9.5 02/11/2024 0934   GFRNONAA >60 02/28/2020 0930   GFRAA >60 02/28/2020 0930    BNP No results found for: "BNP"   Imaging:  No results found.  acetaminophen (TYLENOL) tablet 650 mg     Date Action Dose Route User   02/08/2024 1541 Given 650 mg Oral Rennie Plowman, RN  ferumoxytol (FERAHEME) 510 mg in sodium chloride 0.9 % 100 mL IVPB     Date Action Dose Route User   02/02/2024 1541 Rate/Dose Change (none) Intravenous Berry Bristol, RN   02/02/2024 1541 New Bag/Given 510 mg Intravenous Skwier, Amy L, RN      ferumoxytol (FERAHEME) 510 mg in sodium chloride 0.9 % 100 mL IVPB     Date Action Dose Route User   02/08/2024 1549 Infusion Verify (none) Intravenous Elayne Greaser, RN   02/08/2024 1549 New Bag/Given 510 mg Intravenous Elayne Greaser, RN      0.9 %  sodium chloride infusion     Date Action Dose Route User   02/02/2024 1601 Rate/Dose Change (none) Intravenous Skwier, Amy L, RN   02/02/2024 1559 Rate/Dose Change (none) Intravenous Berry Bristol, RN   02/02/2024 1559 Rate/Dose Change (none) Intravenous Berry Bristol, RN   02/02/2024 1556 Rate/Dose Change (none) Intravenous Berry Bristol, RN   02/02/2024 1540 New Bag/Given (none) Intravenous Skwier, Amy L, RN           No data to display          No results found for: "NITRICOXIDE"      Assessment & Plan:   Excessive daytime sleepiness She has snoring, excessive daytime sleepiness, restless sleep, morning headaches. BMI 28. Given this,  I am concerned she could have sleep disordered breathing with obstructive sleep apnea. She will need sleep study for further evaluation.    - discussed how weight can impact sleep and risk for sleep disordered breathing - discussed options to assist with weight loss: combination of diet modification, cardiovascular and strength  training exercises   - had an extensive discussion regarding the adverse health consequences related to untreated sleep disordered breathing - specifically discussed the risks for hypertension, coronary artery disease, cardiac dysrhythmias, cerebrovascular disease, and diabetes - lifestyle modification discussed   - discussed how sleep disruption can increase risk of accidents, particularly when driving - safe driving practices were discussed  Suspect fatigue is multifactorial related to deconditioning, hypothyroid, post menopausal, etc. If no significant sleep disordered breathing, could trial sleep aid to see if she has any perceived benefit with more restorative sleep. Will reassess at follow up.  Patient Instructions  Given your symptoms, I am concerned that you may have sleep disordered breathing with sleep apnea. You will need a sleep study for further evaluation. Someone will contact you to schedule this.   We discussed how untreated sleep apnea puts an individual at risk for cardiac arrhthymias, pulm HTN, DM, stroke and increases their risk for daytime accidents. We also briefly reviewed treatment options including weight loss, side sleeping position, oral appliance, CPAP therapy or referral to ENT for possible surgical options  Use caution when driving and pull over if you become sleepy.  Follow up in 6 weeks with Katie Joran Kallal,NP to go over sleep study results, or sooner, if needed. Friday PM virtual clinic preferred       Snoring See above  Asthma Intermittent. Managed by PCP. Infrequent SABA use. Continue PRN SABA use.   Advised if symptoms do not improve or worsen, to please contact office for sooner follow up or seek emergency care.   I spent 45 minutes of dedicated to the care of this patient on the date of this encounter to include pre-visit review of records, face-to-face time with the patient discussing conditions above, post visit ordering of testing, clinical  documentation with the electronic health record, making appropriate  referrals as documented, and communicating necessary findings to members of the patients care team.  Roetta Clarke, NP 02/24/2024  Pt aware and understands NP's role.

## 2024-03-14 ENCOUNTER — Encounter

## 2024-03-14 DIAGNOSIS — R0683 Snoring: Secondary | ICD-10-CM

## 2024-03-14 DIAGNOSIS — G4719 Other hypersomnia: Secondary | ICD-10-CM

## 2024-03-15 IMAGING — MG MM DIGITAL SCREENING BILAT W/ TOMO AND CAD
8 series · 8 of 24 positions shown · non-contrast
Comparison: Previous exam(s).

CLINICAL DATA: Screening.

EXAM:
DIGITAL SCREENING BILATERAL MAMMOGRAM WITH TOMOSYNTHESIS AND CAD
TECHNIQUE: Bilateral screening digital craniocaudal and mediolateral oblique
mammograms were obtained. Bilateral screening digital breast
tomosynthesis was performed. The images were evaluated with
computer-aided detection.

[L MLO synth-2D]
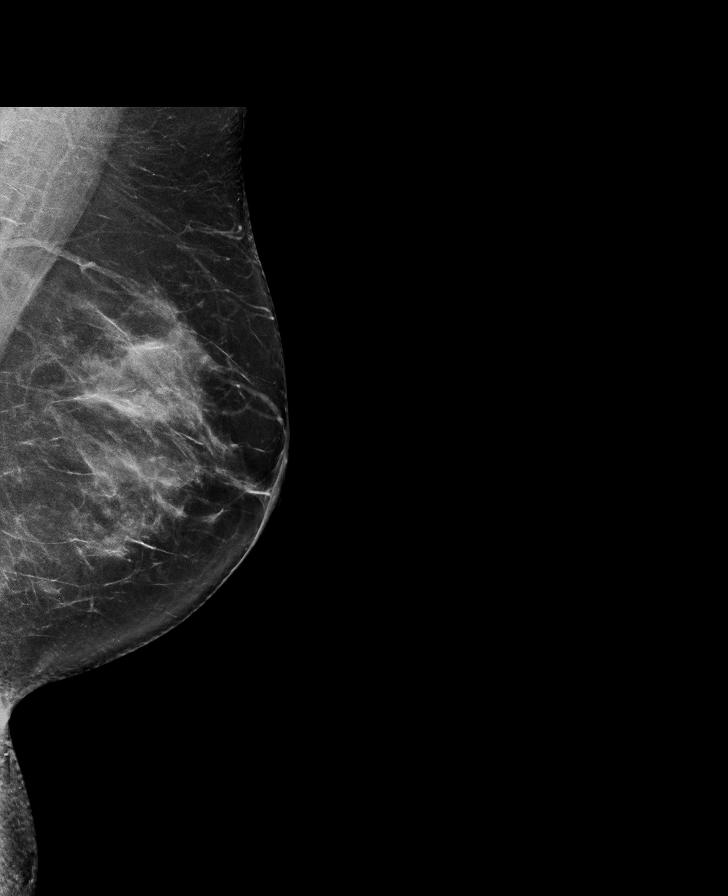

[L CC synth-2D]
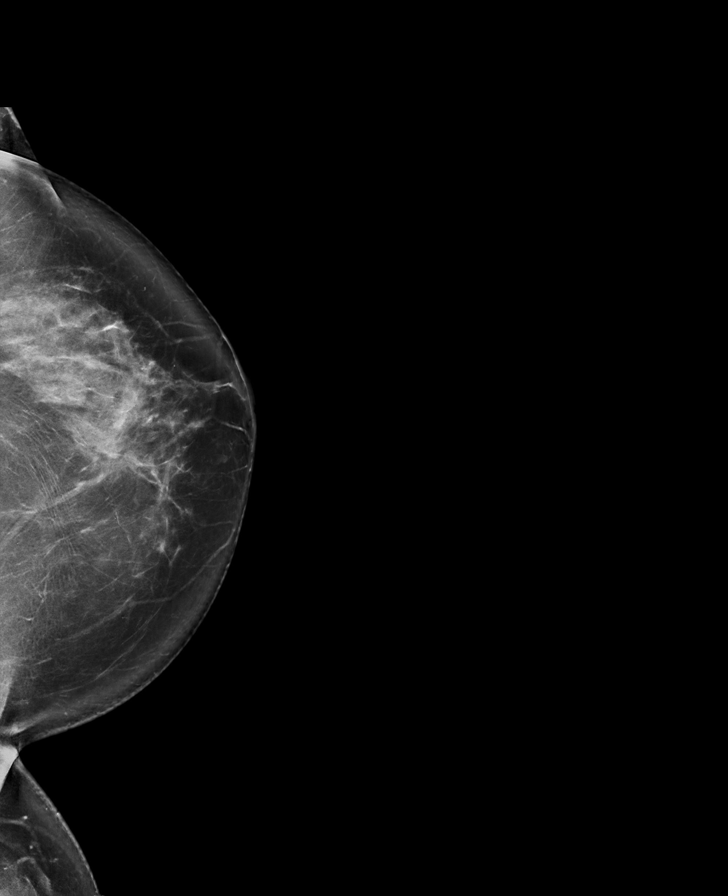

[R MLO synth-2D]
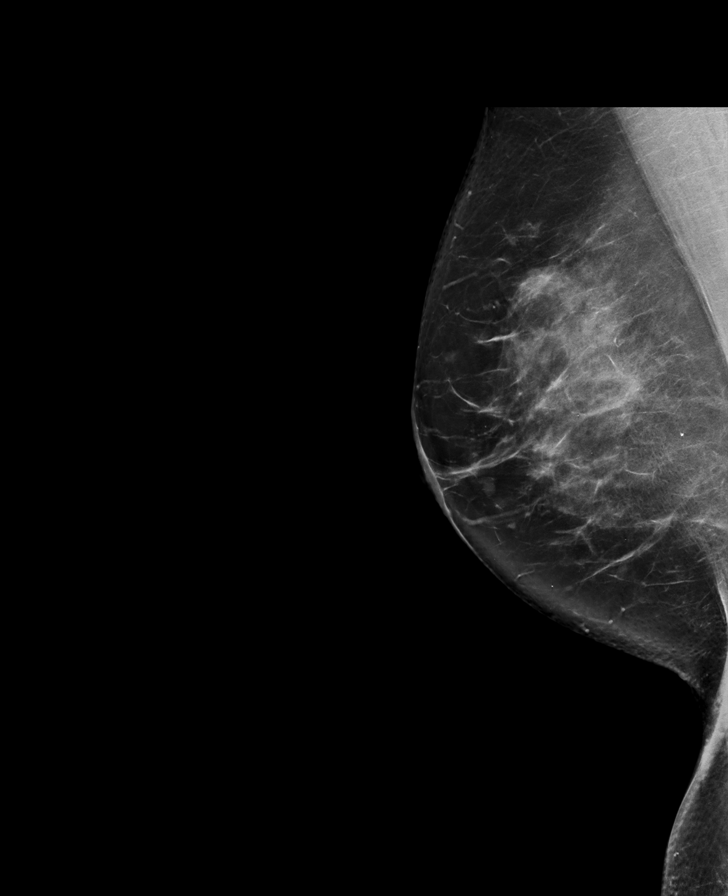

[R CC synth-2D]
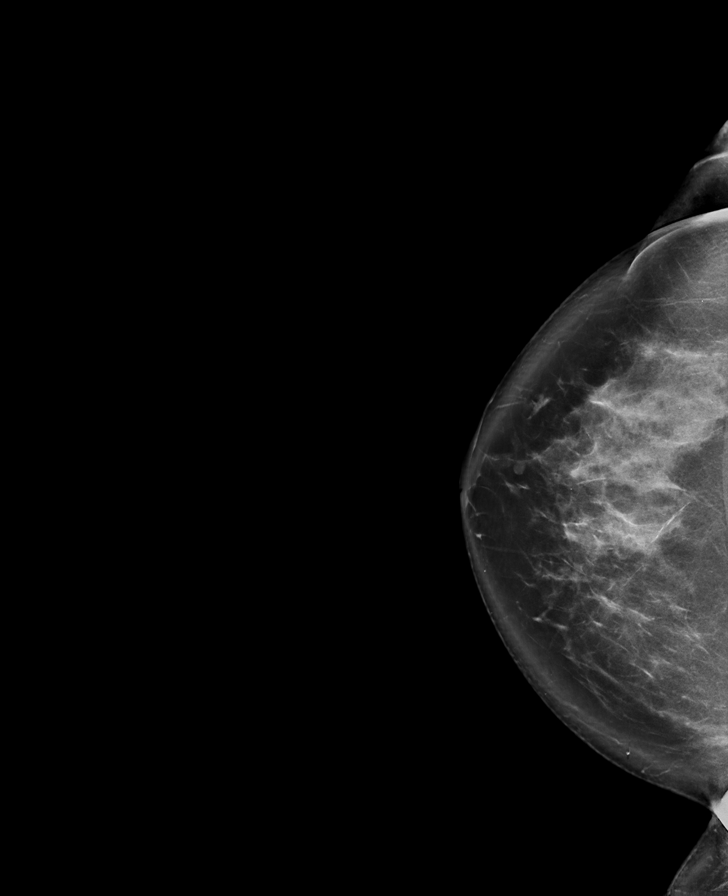

[R CC tomo · tomo slice 50/99.0]
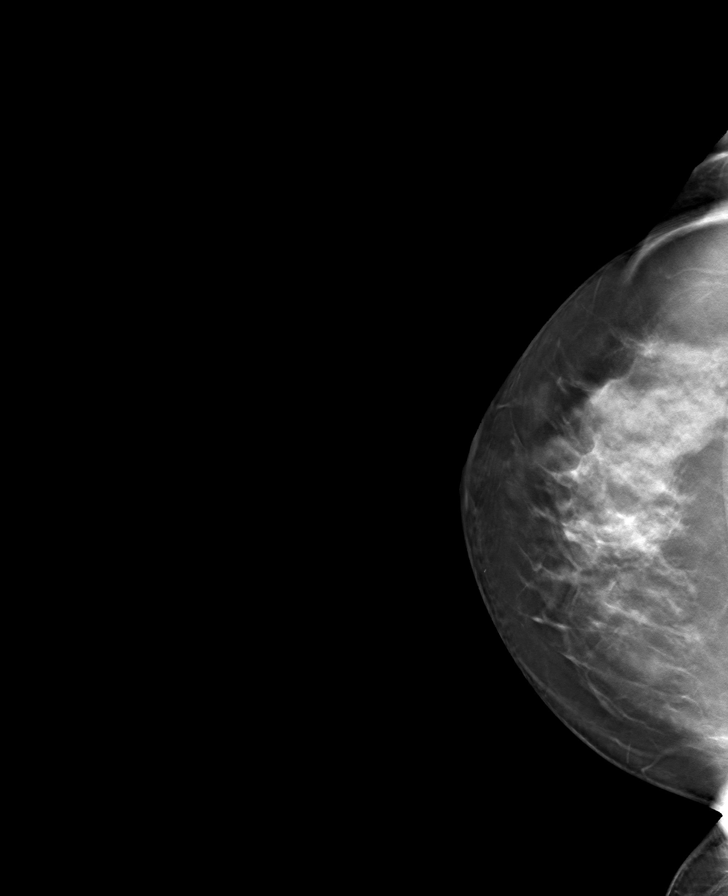

[L MLO tomo · tomo slice 43/84.0]
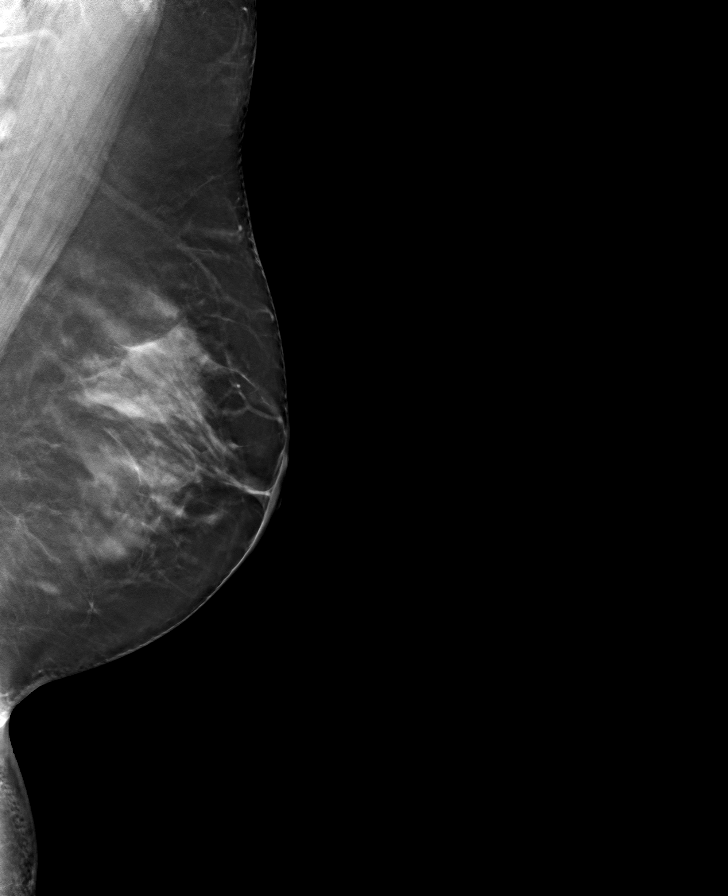

[L CC tomo · tomo slice 47/92.0]
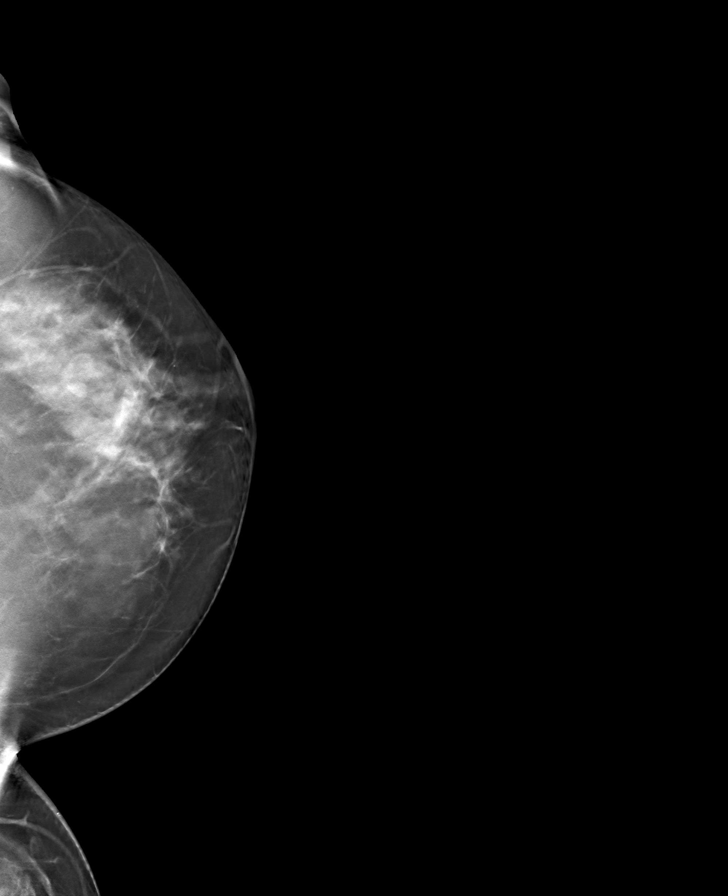

[R MLO tomo · tomo slice 47/94.0]
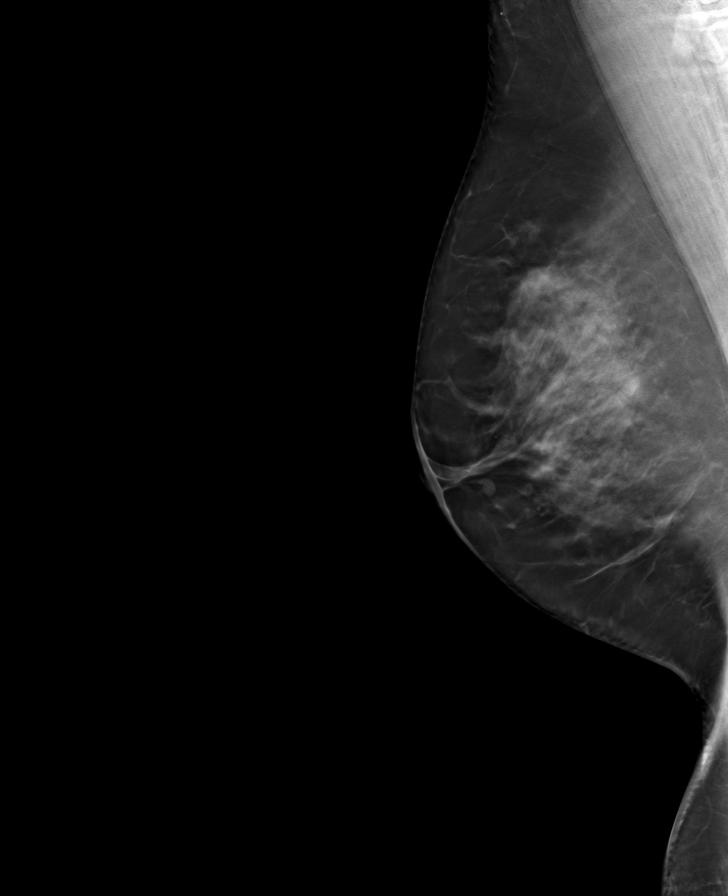

[8 of 24 positions shown; findings below may reference images not displayed]

ACR Breast Density Category c: The breast tissue is heterogeneously
dense, which may obscure small masses.
FINDINGS: There are no findings suspicious for malignancy.
IMPRESSION: No mammographic evidence of malignancy. A result letter of this
screening mammogram will be mailed directly to the patient.

RECOMMENDATION:
Screening mammogram in one year. (Code:Q3-W-BC3)

BI-RADS CATEGORY  1: Negative.

## 2024-03-16 ENCOUNTER — Ambulatory Visit: Admitting: Gastroenterology

## 2024-03-21 DIAGNOSIS — G4733 Obstructive sleep apnea (adult) (pediatric): Secondary | ICD-10-CM | POA: Diagnosis not present

## 2024-04-01 ENCOUNTER — Ambulatory Visit: Payer: Self-pay | Admitting: Nurse Practitioner

## 2024-05-03 ENCOUNTER — Other Ambulatory Visit: Payer: Self-pay

## 2024-05-03 DIAGNOSIS — D508 Other iron deficiency anemias: Secondary | ICD-10-CM

## 2024-05-04 ENCOUNTER — Inpatient Hospital Stay

## 2024-05-04 ENCOUNTER — Telehealth: Payer: Self-pay | Admitting: Family Medicine

## 2024-05-04 ENCOUNTER — Other Ambulatory Visit

## 2024-05-04 NOTE — Telephone Encounter (Signed)
 Copied from CRM [id number]. Topic: Referral - Question >> May 04, 2024  1:23 PM Abigail D wrote: Reason for CRM: Patient making request to change referral as the only appt available for Rheumatology is in September on a Monday at 8am and she can not make that appointment time.She is requesting the referral to be sent somewhere else, either in [facility] or closer if possible. Patient it looking to get an appt as soon as possible.

## 2024-05-05 NOTE — Telephone Encounter (Signed)
 Pt notified. Pt verbalized understanding. Informed pt that given the holiday coming up may take longer due to holiday coming up.

## 2024-05-18 ENCOUNTER — Encounter: Payer: Self-pay | Admitting: Obstetrics and Gynecology

## 2024-05-18 ENCOUNTER — Other Ambulatory Visit: Payer: Self-pay | Admitting: Obstetrics and Gynecology

## 2024-05-18 DIAGNOSIS — Z1231 Encounter for screening mammogram for malignant neoplasm of breast: Secondary | ICD-10-CM

## 2024-05-20 ENCOUNTER — Ambulatory Visit
Admission: RE | Admit: 2024-05-20 | Discharge: 2024-05-20 | Disposition: A | Discharge status: Home / Self Care | Source: Ambulatory Visit | Attending: Obstetrics and Gynecology | Admitting: Obstetrics and Gynecology

## 2024-05-20 DIAGNOSIS — Z1231 Encounter for screening mammogram for malignant neoplasm of breast: Secondary | ICD-10-CM | POA: Diagnosis present

## 2024-05-23 NOTE — Telephone Encounter (Unsigned)
 Copied from CRM [id number]. Topic: Referral - Status >> May 23, 2024  9:50 AM Avram MATSU wrote: Reason for CRM: patient stated she called [facility] about her referral and they have not received it.  Patient would like a callback to figure out what's happening with the referral. The number that was provided on the letter was not correct and not in use.   Please refax the referral.   Fax:[phone]

## 2024-05-24 NOTE — Telephone Encounter (Signed)
 Noted

## 2024-05-27 ENCOUNTER — Encounter: Payer: Self-pay | Admitting: Nurse Practitioner

## 2024-05-27 ENCOUNTER — Ambulatory Visit (INDEPENDENT_AMBULATORY_CARE_PROVIDER_SITE_OTHER): Admitting: Nurse Practitioner

## 2024-05-27 VITALS — BP 112/70 | HR 74 | Ht 67.0 in

## 2024-05-27 DIAGNOSIS — D8989 Other specified disorders involving the immune mechanism, not elsewhere classified: Secondary | ICD-10-CM | POA: Diagnosis not present

## 2024-05-27 DIAGNOSIS — J4520 Mild intermittent asthma, uncomplicated: Secondary | ICD-10-CM

## 2024-05-27 DIAGNOSIS — R21 Rash and other nonspecific skin eruption: Secondary | ICD-10-CM

## 2024-05-27 DIAGNOSIS — R768 Other specified abnormal immunological findings in serum: Secondary | ICD-10-CM

## 2024-05-27 DIAGNOSIS — M2550 Pain in unspecified joint: Secondary | ICD-10-CM

## 2024-05-27 DIAGNOSIS — G4733 Obstructive sleep apnea (adult) (pediatric): Secondary | ICD-10-CM | POA: Diagnosis not present

## 2024-05-27 NOTE — Assessment & Plan Note (Signed)
 Mild OSA with AHI 5.5/h. Minimal cardiovascular risks associated with mild OSA. Significant daytime burden. Discussed treatment options. Shared decision to move forward with CPAP given symptoms. Educated on proper use/care of device. Risks/benefits reviewed. Order for auto CPAP 5-15 cmH2O, mask of choice and heated humidity placed. Continued healthy weight management. Safe driving practices reviewed.   Patient Instructions  Start CPAP every night, minimum of 4-6 hours a night.  Change equipment as directed. Wash your tubing with warm soap and water  daily, hang to dry. Wash humidifier portion weekly. Use bottled, distilled water  and change daily Be aware of reduced alertness and do not drive or operate heavy machinery if experiencing this or drowsiness.  Exercise encouraged, as tolerated. Healthy weight management discussed.  Avoid or decrease alcohol consumption and medications that make you more sleepy, if possible. Notify if persistent daytime sleepiness occurs even with consistent use of PAP therapy.  Change CPAP supplies... Every month Mask cushions and/or nasal pillows CPAP machine filters Every 3 months Mask frame (not including the headgear) CPAP tubing Every 6 months Mask headgear Chin strap (if applicable) Humidifier water  tub  Minimal cardiovascular risks with mild sleep apnea. We also briefly reviewed treatment options including weight loss, side sleeping position, oral appliance, CPAP therapy or referral to ENT for possible surgical options  Switch cetirizine (zyrtec) to Xyzal, Claritin, or allegra  Talk to your doctor about hydroxyzine for the itching   Call [facility] rheumatology about your appt Referral number: [id number]  [phone]   I did put a referral back in to our rheumatology group   Follow up in 10-12 weeks with Katie Jahson Emanuele,NP, or sooner, if needed

## 2024-05-27 NOTE — Progress Notes (Signed)
 @Patient  ID: Cynthia Burke, female    DOB: 1976-06-02, 48 y.o.   MRN: [id number]  Chief Complaint  Patient presents with   Follow-up    Review HST.     Referring provider: No ref. provider found  HPI: 48 year old female, never smoker followed for mild OSA. Past medical history significant for HTN, allergic rhinitis, asthma, IBS, hypothyroid, ADHD, HLD, prediabetes.   TEST/EVENTS:  03/14/2024 HST: AHI 5.5/h, SpO2 low 88%  02/24/2024: OV with Thelia Tanksley NP for sleep consult Discussed the use of AI scribe software for clinical note transcription with the patient, who gave verbal consent to proceed. Cynthia Burke is a 48 year old female who presents for a sleep consult due to chronic fatigue and snoring. She has experienced chronic fatigue and snoring for over ten years. Despite obtaining a full night's sleep, she feels persistently tired and wakes up at least once during the night, often to use the bathroom or due to hunger. No frequent choking or gasping for air upon waking, and morning headaches are rare. She has experienced sleep paralysis very rarely, about two or three times in her life. She has tried various sleep aids in the past, including hydroxyzine, melatonin and trazodone, but they were either ineffective or caused morning hangover. She did briefly try ambien but does not recall exact response to this. No history of sleep walking. She does not currently take any sleep medications. She has a history of Hashimoto's thyroiditis and reports feeling better on Armour thyroid  compared to Synthroid . Her TSH was last checked in February and within normal limits. She sees an endocrinologist every three to six months for monitoring. 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 She is on hormone replacement therapy with estrogen. She has discussed the possibility of adding testosterone with  her gynecologist but is concerned about the associated risks. She has a family history of mother with pulmonary hypertension. Her mother does have asthma but no other cardiac or pulmonary diseases that she is aware of.  She was diagnosed with asthma at age 5, which is mostly triggered by illness. She uses albuterol  infrequently, about once a month. No wheezing, cough, congestion.  She has a history of high cholesterol since age 30, which is likely familial. She has had a positive ANA test recently, but other inflammatory markers have been negative. She is awaiting workup with rheumatology.  She has been diagnosed with ADHD and is on medication for it.  She consumes caffeine, equivalent to two to three cups of coffee a day, and tries to limit her intake to 200-300 mg per day. She does not consume alcohol. She recently started on semaglutide  for weight management as well as focusing on a meal plan and exercise program, though she finds it challenging to adhere to due to fatigue and her work schedule as a physical therapist.  She's never had a sleep study before. She goes to bed around 10pm-midnight. Falls asleep within an hour. Gets up 1-2 times to use the restroom. Wakes up around 7 am. Does not operate any heavy machinery in her job Animal nutritionist.  [place] [address]    05/27/2024: Today - follow up Discussed the use of AI scribe software for clinical note transcription with the patient, who gave verbal consent to proceed.  History of Present Illness Cynthia Burke is a 48 year old female with mild sleep apnea who presents with excessive  daytime fatigue and sleep disturbances.  She experiences excessive daytime fatigue and sleep disturbances, and a recent sleep study showed 5.5 events per hour. She feels exhausted and is interested in trying a CPAP machine to improve her sleep quality and energy levels.  She wears a watch to bed, which shows an average of 5 hours and 49 minutes of sleep per night  with only 12 minutes of deep sleep over the past week. She is postmenopausal and on hormone replacement therapy, which she believes may affect her sleep.  She has been using Nyquil at night and DayQuil during the day, but notes that when she does not take these, her arms becomes very itchy. She is currently taking a daily allergy pill, Zyrtec, but has been on this for many years. She was on Singular in the past but stopped this a few years ago. She has a rash that comes and goes. She has a history of eczema and asthma. Asthma feels well managed at this time.   She is also dealing with a positive ANA, polyarthralgia, and the rash, and is trying to get an appointment with a rheumatologist. She has experienced difficulty scheduling an appointment and is considering different options for care. She feels frustrated with the process and is concerned about her symptoms.    Allergies  Allergen Reactions   Amlodipine      Swelling  Other reaction(s): Other (See Comments) Swelling Swelling   Bupropion Other (See Comments)    Seizures   Other reaction(s): Other (See Comments), Other (See Comments), Other (See Comments) Seizures  seizure Seizures    Spironolactone      ?eyelid swelling worse at 50mg  dose   Latex Other (See Comments) and Hives    Itching, sneezing Other reaction(s): Other (See Comments) Itching, sneezing Itching, sneezing Itching, sneezing  Itching, sneezing   Other Other (See Comments)    Tree pollen   Other reaction(s): Other (See Comments), Other (See Comments) Tree pollen  unknown Tree pollen     Immunization History  Administered Date(s) Administered   Influenza, Seasonal, Injecte, Preservative Fre 09/04/2023   Influenza,inj,Quad PF,6+ Mos 10/27/2017, 09/02/2018, 10/31/2021, 07/04/2022   Influenza-Unspecified 10/27/2017, 10/27/2017   Moderna SARS-COV2 Booster Vaccination 06/13/2021   PFIZER(Purple Top)SARS-COV-2 Vaccination 03/03/2020, 03/30/2020   PPD Test  06/26/2021   Td (Adult),5 Lf Tetanus Toxid, Preservative Free 05/26/2016   Tdap 09/02/2018    Past Medical History:  Diagnosis Date   ADD (attention deficit disorder)    Allergy    Anemia    Anemia 12/21/2018   Annual physical exam 06/03/2019   Anxiety and depression 09/02/2018   Arthralgia 03/26/2020   Formatting of this note might be different from the original.  April 2021     Last Assessment & Plan:   Formatting of this note might be different from the original.  Work-up April 2020: normal Lyme/RMSF/ANA/Celiac panel /CK   Asthma    BMI 28.0-28.9,adult 07/04/2022   COVID-19    Depression    Hashimoto's disease    Hashimoto's thyroiditis    History of chicken pox    History of UTI    HLD (hyperlipidemia) 09/02/2018   Hyperlipidemia    Need for hepatitis B screening test 03/26/2020   Premature ovarian insufficiency    FSH 29 Dr. Carroll    Rash 03/08/2020   Seizures (HCC)    medication induced wellbutrin   Thumb pain, left 09/02/2018    Tobacco History: Social History   Tobacco Use  Smoking Status Never  Smokeless Tobacco Never   Counseling given: Not Answered   Outpatient Medications Prior to Visit  Medication Sig Dispense Refill   albuterol  (VENTOLIN  HFA) 108 (90 Base) MCG/ACT inhaler Inhale 1-2 puffs into the lungs every 6 (six) hours as needed for wheezing or shortness of breath. 1 g 12   Calcium Carbonate-Vit D-Min (CALTRATE 600+D PLUS PO) Take 4 capsules by mouth daily.     Cetirizine HCl (ZYRTEC PO) Take 1 tablet by mouth daily.     estradiol (ESTRACE) 0.1 MG/GM vaginal cream Place vaginally.     estradiol (ESTRACE) 1 MG tablet Take 1 mg by mouth daily.     fluticasone (FLONASE) 50 MCG/ACT nasal spray Place 2 sprays into both nostrils daily.     linaclotide  (LINZESS ) 145 MCG CAPS capsule Take 1 capsule (145 mcg total) by mouth daily before breakfast. 90 capsule 3   lisdexamfetamine (VYVANSE ) 70 MG capsule Take 70 mg by mouth daily.      losartan -hydrochlorothiazide (HYZAAR) 50-12.5 MG tablet Take 1 tablet by mouth every morning. 90 tablet 3   Omega-3 Fatty Acids (OMEGA-3 PO) Take by mouth.     Probiotic, Lactobacillus, CAPS      Sulfacetamide  Sodium, Acne, 10 % LOTN APPLY TOPICALLY TO THE AFFECTED AREA TWICE DAILY AS NEEDED 118 mL 0   thyroid  (ARMOUR) 90 MG tablet Take one tablet SIX days per week. Take two tablets on Sundays.     Vilazodone HCl (VIIBRYD) 40 MG TABS Take 40 mg by mouth daily.     Amphet-Dextroamphet 3-Bead ER (MYDAYIS) 50 MG CP24 Take 50 mg by mouth daily.     No facility-administered medications prior to visit.     Review of Systems:   Constitutional: No night sweats, fevers, chills, or lassitude. +weight gain, fatigue  HEENT: No difficulty swallowing, tooth/dental problems, or sore throat. +seasonal allergies, occasional headaches CV:  No chest pain, orthopnea, PND, swelling in lower extremities, anasarca, dizziness, palpitations, syncope Resp: +snoring, minimal shortness of breath with exertion. No excess mucus or change in color of mucus. No productive or non-productive. No hemoptysis. No wheezing.  No chest wall deformity GI:  +occasional heartburn, indigestion. No abdominal pain, nausea, vomiting, diarrhea, change in bowel habits, loss of appetite, bloody stools.  GU: No dysuria, change in color of urine, urgency or frequency.  No flank pain, no hematuria  Skin: +rash (awaiting rheumatology workup) MSK:  +joint pains Neuro: No dizziness or lightheadedness.  Psych: +stable depression, anxiety. No SI/HI. Mood stable. +sleep disturbance    Physical Exam:  BP 112/70 (BP Location: Left Arm, Cuff Size: Normal)   Pulse 74   Ht 5' 7 (1.702 m)   LMP 09/10/2018   SpO2 98%   BMI 28.98 kg/m   GEN: Pleasant, interactive, well-appearing; in no acute distress HEENT:  Normocephalic and atraumatic. PERRLA. Sclera white. Nasal turbinates pink, moist and patent bilaterally. No rhinorrhea present.  Oropharynx pink and moist, without exudate or edema. No lesions, ulcerations, or postnasal drip. Mallampati II/III NECK:  Supple w/ fair ROM. No JVD present. Normal carotid impulses w/o bruits. Thyroid  symmetrical with no goiter or nodules palpated. No lymphadenopathy.   CV: RRR, no m/r/g, no peripheral edema. Pulses intact, +2 bilaterally. No cyanosis, pallor or clubbing. PULMONARY:  Unlabored, regular breathing. Clear bilaterally A&P w/o wheezes/rales/rhonchi. No accessory muscle use.  GI: BS present and normoactive. Soft, non-tender to palpation. No organomegaly or masses detected.  MSK: No erythema, warmth or tenderness. Cap refil <2 sec all extrem. No deformities or joint  swelling noted.  Neuro: A/Ox3. No focal deficits noted.   Skin: Warm, dry. Scaly rash to LUE Psych: Normal affect and behavior. Judgement and thought content appropriate.     Lab Results:  CBC    Component Value Date/Time   WBC 10.4 01/27/2024 1357   RBC 4.95 01/27/2024 1357   HGB 14.5 01/27/2024 1357   HGB 11.6 (L) 10/28/2018 1031   HCT 44.1 01/27/2024 1357   PLT 452 (H) 01/27/2024 1357   PLT 372 10/28/2018 1031   MCV 89.1 01/27/2024 1357   MCH 29.3 01/27/2024 1357   MCHC 32.9 01/27/2024 1357   RDW 13.8 01/27/2024 1357   LYMPHSABS 3.0 01/27/2024 1357   MONOABS 0.8 01/27/2024 1357   EOSABS 0.3 01/27/2024 1357   BASOSABS 0.1 01/27/2024 1357    BMET    Component Value Date/Time   NA 137 02/11/2024 0934   K 3.6 02/11/2024 0934   CL 103 02/11/2024 0934   CO2 28 02/11/2024 0934   GLUCOSE 93 02/11/2024 0934   BUN 14 02/11/2024 0934   CREATININE 1.00 02/11/2024 0934   CALCIUM 9.5 02/11/2024 0934   GFRNONAA >60 02/28/2020 0930   GFRAA >60 02/28/2020 0930    BNP No results found for: BNP   Imaging:  MM 3D SCREENING MAMMOGRAM BILATERAL BREAST Result Date: 05/25/2024 CLINICAL DATA:  Screening. EXAM: DIGITAL SCREENING BILATERAL MAMMOGRAM WITH TOMOSYNTHESIS AND CAD TECHNIQUE: Bilateral screening  digital craniocaudal and mediolateral oblique mammograms were obtained. Bilateral screening digital breast tomosynthesis was performed. The images were evaluated with computer-aided detection. COMPARISON:  Previous exam(s). ACR Breast Density Category c: The breasts are heterogeneously dense, which may obscure small masses. FINDINGS: There are no findings suspicious for malignancy. IMPRESSION: No mammographic evidence of malignancy. A result letter of this screening mammogram will be mailed directly to the patient. RECOMMENDATION: Screening mammogram in one year. (Code:[id number]-B-01Y) BI-RADS CATEGORY  1: Negative. Electronically Signed   By: Alm Parkins M.D.   On: 05/25/2024 12:07    Administration History     None           No data to display          No results found for: NITRICOXIDE      Assessment & Plan:   Mild obstructive sleep apnea Mild OSA with AHI 5.5/h. Minimal cardiovascular risks associated with mild OSA. Significant daytime burden. Discussed treatment options. Shared decision to move forward with CPAP given symptoms. Educated on proper use/care of device. Risks/benefits reviewed. Order for auto CPAP 5-15 cmH2O, mask of choice and heated humidity placed. Continued healthy weight management. Safe driving practices reviewed.   Patient Instructions  Start CPAP every night, minimum of 4-6 hours a night.  Change equipment as directed. Wash your tubing with warm soap and water  daily, hang to dry. Wash humidifier portion weekly. Use bottled, distilled water  and change daily Be aware of reduced alertness and do not drive or operate heavy machinery if experiencing this or drowsiness.  Exercise encouraged, as tolerated. Healthy weight management discussed.  Avoid or decrease alcohol consumption and medications that make you more sleepy, if possible. Notify if persistent daytime sleepiness occurs even with consistent use of PAP therapy.  Change CPAP supplies... Every  month Mask cushions and/or nasal pillows CPAP machine filters Every 3 months Mask frame (not including the headgear) CPAP tubing Every 6 months Mask headgear Chin strap (if applicable) Humidifier water  tub  Minimal cardiovascular risks with mild sleep apnea. We also briefly reviewed treatment options including weight loss, side  sleeping position, oral appliance, CPAP therapy or referral to ENT for possible surgical options  Switch cetirizine (zyrtec) to Xyzal, Claritin, or allegra  Talk to your doctor about hydroxyzine for the itching   Call [facility] rheumatology about your appt Referral number: [id number]  [phone]   I did put a referral back in to our rheumatology group   Follow up in 10-12 weeks with Katie Shantoria Ellwood,NP, or sooner, if needed    Rash Possibly allergic in nature. Advised she trial change from zyrtec to alternative OTC non drowsy antihistamine as she has been on this for many years. May benefit from hydroxyzine PRN itching at bedtime - encouraged to discuss with PCP. She is also awaiting rheumatology evaluation given associated polyarthralgia, positive ANA. Difficulties obtaining an appt. Provided with info for [facility] team per the referral note in Epic. Referral placed to [facility] as well.   Asthma Intermittent. Managed by PCP. Well controlled.    Advised if symptoms do not improve or worsen, to please contact office for sooner follow up or seek emergency care.   I spent 35 minutes of dedicated to the care of this patient on the date of this encounter to include pre-visit review of records, face-to-face time with the patient discussing conditions above, post visit ordering of testing, clinical documentation with the electronic health record, making appropriate referrals as documented, and communicating necessary findings to members of the patients care team.  Comer LULLA Rouleau, NP 05/27/2024  Pt aware and understands NP's role.

## 2024-05-27 NOTE — Patient Instructions (Addendum)
 Start CPAP every night, minimum of 4-6 hours a night.  Change equipment as directed. Wash your tubing with warm soap and water  daily, hang to dry. Wash humidifier portion weekly. Use bottled, distilled water  and change daily Be aware of reduced alertness and do not drive or operate heavy machinery if experiencing this or drowsiness.  Exercise encouraged, as tolerated. Healthy weight management discussed.  Avoid or decrease alcohol consumption and medications that make you more sleepy, if possible. Notify if persistent daytime sleepiness occurs even with consistent use of PAP therapy.  Change CPAP supplies... Every month Mask cushions and/or nasal pillows CPAP machine filters Every 3 months Mask frame (not including the headgear) CPAP tubing Every 6 months Mask headgear Chin strap (if applicable) Humidifier water  tub  Minimal cardiovascular risks with mild sleep apnea. We also briefly reviewed treatment options including weight loss, side sleeping position, oral appliance, CPAP therapy or referral to ENT for possible surgical options  Switch cetirizine (zyrtec) to Xyzal, Claritin, or allegra  Talk to your doctor about hydroxyzine for the itching   Call [facility] rheumatology about your appt Referral number: [id number]  [id number]   I did put a referral back in to our rheumatology group   Follow up in 10-12 weeks with Katie Terrill Alperin,NP, or sooner, if needed

## 2024-05-27 NOTE — Assessment & Plan Note (Signed)
 Intermittent. Managed by PCP. Well controlled.

## 2024-05-27 NOTE — Assessment & Plan Note (Signed)
 Possibly allergic in nature. Advised she trial change from zyrtec to alternative OTC non drowsy antihistamine as she has been on this for many years. May benefit from hydroxyzine PRN itching at bedtime - encouraged to discuss with PCP. She is also awaiting rheumatology evaluation given associated polyarthralgia, positive ANA. Difficulties obtaining an appt. Provided with info for [facility] team per the referral note in Epic. Referral placed to [facility] as well.

## 2024-06-01 ENCOUNTER — Other Ambulatory Visit: Payer: Self-pay

## 2024-06-01 DIAGNOSIS — L709 Acne, unspecified: Secondary | ICD-10-CM

## 2024-06-01 MED ORDER — SULFACETAMIDE SODIUM (ACNE) 10 % EX LOTN: TOPICAL_LOTION | CUTANEOUS | 0 refills | Status: DC

## 2024-06-01 NOTE — Telephone Encounter (Signed)
 LOV-01/27/2024 NOV-08/04/2024 LR-01/27/2024  Called pt and she a little left in the bottle. She stated that she has not seen dermatology in a while

## 2024-07-18 ENCOUNTER — Encounter: Admitting: Internal Medicine

## 2024-07-29 ENCOUNTER — Inpatient Hospital Stay: Attending: Oncology

## 2024-07-29 ENCOUNTER — Encounter: Payer: Self-pay | Admitting: Oncology

## 2024-07-29 ENCOUNTER — Inpatient Hospital Stay (HOSPITAL_BASED_OUTPATIENT_CLINIC_OR_DEPARTMENT_OTHER): Admitting: Oncology

## 2024-07-29 VITALS — BP 112/89 | HR 109 | Temp 97.2°F | Resp 18 | Ht 67.0 in | Wt 171.2 lb

## 2024-07-29 DIAGNOSIS — D75839 Thrombocytosis, unspecified: Secondary | ICD-10-CM | POA: Diagnosis not present

## 2024-07-29 DIAGNOSIS — D508 Other iron deficiency anemias: Secondary | ICD-10-CM | POA: Insufficient documentation

## 2024-07-29 LAB — FERRITIN: Ferritin: 165 ng/mL (ref 11–307)

## 2024-07-29 LAB — IRON AND TIBC
Iron: 84 ug/dL (ref 28–170)
Saturation Ratios: 22 % (ref 10.4–31.8)
TIBC: 386 ug/dL (ref 250–450)
UIBC: 302 ug/dL

## 2024-07-29 LAB — CBC (CANCER CENTER ONLY)
HCT: 41.2 % (ref 36.0–46.0)
Hemoglobin: 14.3 g/dL (ref 12.0–15.0)
MCH: 30.7 pg (ref 26.0–34.0)
MCHC: 34.7 g/dL (ref 30.0–36.0)
MCV: 88.4 fL (ref 80.0–100.0)
Platelet Count: 428 K/uL — ABNORMAL HIGH (ref 150–400)
RBC: 4.66 MIL/uL (ref 3.87–5.11)
RDW: 12.2 % (ref 11.5–15.5)
WBC Count: 5.8 K/uL (ref 4.0–10.5)
nRBC: 0 % (ref 0.0–0.2)

## 2024-07-29 NOTE — Progress Notes (Signed)
 Patient states she doing okay, with no new or acute concerns at this time.

## 2024-07-30 ENCOUNTER — Encounter: Payer: Self-pay | Admitting: Oncology

## 2024-07-30 NOTE — Progress Notes (Signed)
 Hematology/Oncology Consult note [facility]  [phone] [phone]  Patient Care Team: Bair, Kalpana, MD as PCP - General (Family Medicine) Melanee Annah BROCKS, MD as Consulting Physician (Hematology and Oncology)   Name of the patient: Cynthia Burke  [id number]  Aug 10, 1976   Date of visit: 07/30/24  Diagnosis-thrombocytosis History of iron deficiency  Chief complaint/ Reason for visit-routine follow-up of thrombocytosis  Heme/Onc history: Patient is a 48 year old female referred for thrombocytosis.  Her most recent CBC from 11/25/2021 showed a white count of 4, H&H of 13/39.3 and a platelet count of 423.  Her prior platelet count in 2019 was normal at 270.  Platelet counts between October and December 2019 have been normal except for 1 occasion admitted as 409.no prior h/o thrombosis.  She is s/p hysterectomy.  Ferritin levels have been low without overt anemia in the past.  Patient did receive IV iron in March 2025 but did not have any significant improvement in her fatigue or improvement in her platelet numbers    Interval history-patient currently reports being under stress due to the health of her family members.  ECOG PS- 0 Pain scale- 0  Review of systems- Review of Systems  Constitutional:  Negative for chills, fever, malaise/fatigue and weight loss.  HENT:  Negative for congestion, ear discharge and nosebleeds.   Eyes:  Negative for blurred vision.  Respiratory:  Negative for cough, hemoptysis, sputum production, shortness of breath and wheezing.   Cardiovascular:  Negative for chest pain, palpitations, orthopnea and claudication.  Gastrointestinal:  Negative for abdominal pain, blood in stool, constipation, diarrhea, heartburn, melena, nausea and vomiting.  Genitourinary:  Negative for dysuria, flank pain, frequency, hematuria and urgency.  Musculoskeletal:  Negative for back pain, joint pain and myalgias.  Skin:   Negative for rash.  Neurological:  Negative for dizziness, tingling, focal weakness, seizures, weakness and headaches.  Endo/Heme/Allergies:  Does not bruise/bleed easily.  Psychiatric/Behavioral:  Negative for depression and suicidal ideas. The patient does not have insomnia.       Allergies  Allergen Reactions   Amlodipine      Swelling  Other reaction(s): Other (See Comments) Swelling Swelling   Bupropion Other (See Comments)    Seizures   Other reaction(s): Other (See Comments), Other (See Comments), Other (See Comments) Seizures  seizure Seizures    Spironolactone      ?eyelid swelling worse at 50mg  dose   Latex Other (See Comments) and Hives    Itching, sneezing Other reaction(s): Other (See Comments) Itching, sneezing Itching, sneezing Itching, sneezing  Itching, sneezing   Other Other (See Comments)    Tree pollen   Other reaction(s): Other (See Comments), Other (See Comments) Tree pollen  unknown Tree pollen      Past Medical History:  Diagnosis Date   ADD (attention deficit disorder)    Allergy    Anemia    Anemia 12/21/2018   Annual physical exam 06/03/2019   Anxiety and depression 09/02/2018   Arthralgia 03/26/2020   Formatting of this note might be different from the original.  April 2021     Last Assessment & Plan:   Formatting of this note might be different from the original.  Work-up April 2020: normal Lyme/RMSF/ANA/Celiac panel /CK   Asthma    BMI 28.0-28.9,adult 07/04/2022   COVID-19    Depression    Hashimoto's disease    Hashimoto's thyroiditis    History of chicken pox    History of UTI  HLD (hyperlipidemia) 09/02/2018   Hyperlipidemia    Need for hepatitis B screening test 03/26/2020   Premature ovarian insufficiency    [facility] 29 Dr. Carroll    Rash 03/08/2020   Seizures (HCC)    medication induced wellbutrin   Thumb pain, left 09/02/2018     Past Surgical History:  Procedure Laterality Date   ABDOMINAL HYSTERECTOMY      Dr. Anitra 11-04-18   DILATION AND CURETTAGE OF UTERUS     2015 and 2018    HYSTERECTOMY ABDOMINAL WITH SALPINGECTOMY Bilateral 11/04/2018   Procedure: HYSTERECTOMY ABDOMINAL WITH BILATERAL SALPINGECTOMY, Exploratory laparotomy;  Surgeon: Anitra Freddy NOVAK, MD;  Location: [facility];  Service: Gynecology;  Laterality: Bilateral;   MYOMECTOMY     2015     Social History   Socioeconomic History   Marital status: Married    Spouse name: Not on file   Number of children: Not on file   Years of education: Not on file   Highest education level: Not on file  Occupational History   Not on file  Tobacco Use   Smoking status: Never   Smokeless tobacco: Never  Vaping Use   Vaping status: Never Used  Substance and Sexual Activity   Alcohol use: No   Drug use: No   Sexual activity: Yes    Birth control/protection: None  Other Topics Concern   Not on file  Social History Narrative   Married    PT   No kids has pets   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   Overall Financial Resource Strain (CARDIA)    Difficulty of Paying Living Expenses: Not hard at all  Food Insecurity: No Food Insecurity (07/05/2024)   Received from [facility]   Hunger Vital Sign    Within the past 12 months, you worried that your food would run out before you got the money to buy more.: Never true    Within the past 12 months, the food you bought just didn't last and you didn't have money to get more.: Never true  Transportation Needs: No Transportation Needs (07/05/2024)   Received from [facility] - Transportation    In the past 12 months, has lack of transportation kept you from medical appointments or from getting medications?: No    Lack of Transportation (Non-Medical): No  Physical Activity: Not on file  Stress: Not  on file  Social Connections: Not on file  Intimate Partner Violence: Not on file    Family History  Problem Relation Age of Onset   Rheum arthritis Mother    Hashimoto's thyroiditis Mother    Hypertension Mother    Hyperlipidemia Mother    Arthritis Mother    Asthma Mother    Depression Mother    Kidney disease Mother    Miscarriages / [place] Mother    Memory loss Mother    Diabetes Mellitus II Father    Hypothyroidism Father    Hypertension Father    Coronary artery disease Father    Hyperlipidemia Father    Arthritis Father    COPD Father    Depression Father    Diabetes Father    Heart disease Father    Kidney disease Father    Bladder Cancer Father    Prostate cancer Father  Peripheral Artery Disease Father    Learning disabilities Brother    Hypertension Brother    Depression Brother    Stroke Maternal Grandmother    Heart disease Maternal Grandmother    Alcohol abuse Maternal Grandfather    Early death Maternal Grandfather    Breast cancer Paternal Grandmother 46   Early death Paternal Grandmother    Cancer Paternal Grandmother        breast died    Miscarriages / Stillbirths Paternal Grandmother    Heart disease Paternal Grandfather      Current Outpatient Medications:    albuterol  (VENTOLIN  HFA) 108 (90 Base) MCG/ACT inhaler, Inhale 1-2 puffs into the lungs every 6 (six) hours as needed for wheezing or shortness of breath., Disp: 1 g, Rfl: 12   Calcium Carbonate-Vit D-Min (CALTRATE 600+D PLUS PO), Take 4 capsules by mouth daily., Disp: , Rfl:    Cetirizine HCl (ZYRTEC PO), Take 1 tablet by mouth daily., Disp: , Rfl:    escitalopram (LEXAPRO) 10 MG tablet, Take 10 mg by mouth daily., Disp: , Rfl:    estradiol (ESTRACE) 0.1 MG/GM vaginal cream, Place vaginally., Disp: , Rfl:    estradiol (ESTRACE) 1 MG tablet, Take 1 mg by mouth daily., Disp: , Rfl:    fluticasone (FLONASE) 50 MCG/ACT nasal spray, Place 2 sprays into both nostrils daily., Disp: ,  Rfl:    linaclotide  (LINZESS ) 145 MCG CAPS capsule, Take 1 capsule (145 mcg total) by mouth daily before breakfast., Disp: 90 capsule, Rfl: 3   lisdexamfetamine (VYVANSE ) 70 MG capsule, Take 70 mg by mouth daily., Disp: , Rfl:    losartan -hydrochlorothiazide (HYZAAR) 50-12.5 MG tablet, Take 1 tablet by mouth every morning., Disp: 90 tablet, Rfl: 3   semaglutide -weight management (WEGOVY ) 0.25 MG/0.5ML SOAJ SQ injection, ADMINISTER 0.25MG  UNDER THE SKIN 1 TIME A WEEK, Disp: , Rfl:    Sulfacetamide  Sodium, Acne, 10 % LOTN, APPLY TOPICALLY TO THE AFFECTED AREA TWICE DAILY AS NEEDED, Disp: 118 mL, Rfl: 0   thyroid  (ARMOUR) 90 MG tablet, Take one tablet SIX days per week. Take two tablets on Sundays., Disp: , Rfl:    Omega-3 Fatty Acids (OMEGA-3 PO), Take by mouth. (Patient not taking: Reported on 07/29/2024), Disp: , Rfl:    Probiotic, Lactobacillus, CAPS, , Disp: , Rfl:    Vilazodone HCl (VIIBRYD) 40 MG TABS, Take 40 mg by mouth daily. (Patient not taking: Reported on 07/29/2024), Disp: , Rfl:   Physical exam:  Vitals:   07/29/24 1503  BP: 112/89  Pulse: (!) 109  Resp: 18  Temp: (!) 97.2 F (36.2 C)  TempSrc: Tympanic  SpO2: 100%  Weight: 171 lb 3.2 oz (77.7 kg)  Height: 5' 7 (1.702 m)   Physical Exam Cardiovascular:     Rate and Rhythm: Normal rate and regular rhythm.     Heart sounds: Normal heart sounds.  Pulmonary:     Effort: Pulmonary effort is normal.     Breath sounds: Normal breath sounds.  Skin:    General: Skin is warm and dry.  Neurological:     Mental Status: She is alert and oriented to person, place, and time.      I have personally reviewed labs listed below:    Latest Ref Rng & Units 02/11/2024    9:34 AM  CMP  Glucose 70 - 99 mg/dL 93   BUN 6 - 23 mg/dL 14   Creatinine 9.59 - 1.20 mg/dL 8.99   Sodium 864 - 854 mEq/L 137  Potassium 3.5 - 5.1 mEq/L 3.6   Chloride 96 - 112 mEq/L 103   CO2 19 - 32 mEq/L 28   Calcium 8.4 - 10.5 mg/dL 9.5   Total Protein 6.0  - 8.3 g/dL 7.2   Total Bilirubin 0.2 - 1.2 mg/dL 0.5   Alkaline Phos 39 - 117 U/L 51   AST 0 - 37 U/L 22   ALT 0 - 35 U/L 22       Latest Ref Rng & Units 07/29/2024    2:31 PM  CBC  WBC 4.0 - 10.5 K/uL 5.8   Hemoglobin 12.0 - 15.0 g/dL 85.6   Hematocrit 63.9 - 46.0 % 41.2   Platelets 150 - 400 K/uL 428    I have personally reviewed Radiology images listed below: No images are attached to the encounter.  No results found.   Assessment and plan- Patient is a 48 y.o. female here for routine follow-up of history of low ferritin and thrombocytosis  Thrombocytosis: Platelet counts fluctuate between 300s to low 400s without a clear rising trend.  Despite correcting her iron deficiency her thrombocytosis does not resolve.  I am checking JAK2 with reflex to CALR and MPL as well as BCR-ABL FISH testing today.  I have reassured the patient that presently her low platelet counts do not pose any bodily harm to her.  Also blood donation is not going to bring down her platelets which was one of her questions.  At this time I am inclined to monitor her thrombocytosis conservatively without the need for any further intervention.  Ferritin and iron studies are presently normal.  CBC ferritin and iron studies in 6 months in 1 year and I will see her back in 1 year   Visit Diagnosis 1. Other iron deficiency anemia      Dr. Annah Skene, MD, MPH [facility] [phone] 07/30/2024 1:13 PM

## 2024-08-04 ENCOUNTER — Ambulatory Visit

## 2024-08-04 VITALS — BP 104/74 | HR 94 | Temp 98.2°F | Ht 66.5 in | Wt 167.8 lb

## 2024-08-04 DIAGNOSIS — J4520 Mild intermittent asthma, uncomplicated: Secondary | ICD-10-CM | POA: Diagnosis not present

## 2024-08-04 DIAGNOSIS — E785 Hyperlipidemia, unspecified: Secondary | ICD-10-CM | POA: Diagnosis not present

## 2024-08-04 DIAGNOSIS — L709 Acne, unspecified: Secondary | ICD-10-CM

## 2024-08-04 DIAGNOSIS — R7303 Prediabetes: Secondary | ICD-10-CM

## 2024-08-04 DIAGNOSIS — J3089 Other allergic rhinitis: Secondary | ICD-10-CM

## 2024-08-04 DIAGNOSIS — I10 Essential (primary) hypertension: Secondary | ICD-10-CM

## 2024-08-04 DIAGNOSIS — R7309 Other abnormal glucose: Secondary | ICD-10-CM | POA: Diagnosis not present

## 2024-08-04 DIAGNOSIS — E063 Autoimmune thyroiditis: Secondary | ICD-10-CM

## 2024-08-04 LAB — BCR-ABL1 FISH
Cells Analyzed: 200
Cells Counted: 200

## 2024-08-04 MED ORDER — SULFACETAMIDE SODIUM (ACNE) 10 % EX LOTN
TOPICAL_LOTION | CUTANEOUS | 5 refills | Status: DC
Start: 2024-08-04 — End: 2024-08-09

## 2024-08-04 NOTE — Assessment & Plan Note (Signed)
 BP well controlled on Losartan -hydrochlorothiazide 50-12.5 mg daily, continue. Check CMP.

## 2024-08-04 NOTE — Progress Notes (Signed)
 Established Patient Office Visit TOC from Dr. Hope    Subjective  Patient ID: Cynthia Burke, female    DOB: 1976-05-31  Age: 48 y.o. MRN: [id number]  Chief Complaint  Patient presents with   Establish Care   Back Pain    She  has a past medical history of ADD (attention deficit disorder), Allergy, Anemia, Anemia (12/21/2018), Annual physical exam (06/03/2019), Anxiety and depression (09/02/2018), Arthralgia (03/26/2020), Asthma, BMI 28.0-28.9,adult (07/04/2022), COVID-19, Depression, Hashimoto's disease, Hashimoto's thyroiditis, History of chicken pox, History of UTI, HLD (hyperlipidemia) (09/02/2018), Hyperlipidemia, Need for hepatitis B screening test (03/26/2020), Premature ovarian insufficiency, Rash (03/08/2020), Seizures (HCC), and Thumb pain, left (09/02/2018).  HPI Discussed the use of AI scribe software for clinical note transcription with the patient, who gave verbal consent to proceed.  History of Present Illness Cynthia Burke is a 48 year old female who presents for TOC from previous PCP and chronic medication management. .  She is concerned about reduced GFR on previous lab done on 02/11/24. GFR was 67.80 compared to 85.46 on 01/01/23.   - H/o seizure on Bupropion, over a decade ago while on bupropion, which she attributes to the medication. Currently on Vyvanse  for ADHD, Lexapro for anxiety and depression. She is established with psychiatrist and a Veterinary surgeon. She has ADHD, diagnosed in her thirties, and finds Vyvanse  helpful, which she takes at 9 AM daily. She consumes caffeine, primarily tea, and is mindful of her intake. She is managing stress related to her parents' health issues, including her mother's recent hospitalization and her father's cellulitis. She acknowledges high anxiety levels and is in therapy, with an upcoming psychiatrist appointment.  - She completed iron infusions with Dr. Melanee but did not notice significant improvement. Her  platelets remain elevated, with a peak of 453.   - She experiences rectal bleeding, constipation, and bloating, and has scheduled an endoscopy and colonoscopy for October. Constipation has improved, and she uses Linzess  as needed.  - She is on Semaglutide  2.5 mg once weekly injection, administered weekly on Mondays, and tolerates it well without nausea or bloating.  - She was diagnosed with asthma at 42 after experiencing wheezing while running. She uses albuterol  before running and occasionally when she feels she cannot breathe deeply, especially in humid conditions. She has not had pulmonary function tests. She also takes Zyrtec daily for allergies, particularly due to her cats, and uses Flonase or Nasonex.  Previously also took Singular but has not taken it in few years.   - She takes famotidine 20 mg for heartburn, which she uses more frequently than before. She has IBS-D and established with [facility]. She has upcoming EGD and colonoscopy with them in October.   - Uses vaginal estrogen vaginal cream for dryness and oral estrogen managed by her gynecologist.   - She has facial acne and uses Sulfacetamide  Sodium 10%, needs refill.   - Has a h/o hashimoto's disease, f/u with Dr. Damian at [facility] endocrine clinic and is on Armour thyroid .   - She has a history of high cholesterol and a positive ANA, with joint pain. She is hesitant to start statins due to potential joint pain. She takes omega-3 supplements but experiences belching. She had a coronary CT last year, which was normal.  She has upcoming appointment with a rheumatologist.      ROS As per HPI    Objective:     BP 104/74 (BP Location: Right Arm, Patient Position: Sitting, Cuff Size: Normal)  Pulse 94   Temp 98.2 F (36.8 C) (Oral)   Ht 5' 6.5 (1.689 m)   Wt 167 lb 12.8 oz (76.1 kg)   LMP 09/10/2018   SpO2 97%   BMI 26.68 kg/m      08/04/2024    3:04 PM 07/29/2024    3:00 PM 01/27/2024   10:57 AM  Depression  screen PHQ 2/9  Decreased Interest 2 0 3  Down, Depressed, Hopeless 3 0 2  PHQ - 2 Score 5 0 5  Altered sleeping 3  3  Tired, decreased energy 3  3  Change in appetite 2  3  Feeling bad or failure about yourself  3  2  Trouble concentrating 1  0  Moving slowly or fidgety/restless 0  0  Suicidal thoughts 0  0  PHQ-9 Score 17  16  Difficult doing work/chores Extremely dIfficult  Very difficult      08/04/2024    3:03 PM 01/27/2024   10:58 AM 09/03/2023    8:28 AM 12/01/2022    4:35 PM  GAD 7 : Generalized Anxiety Score  Nervous, Anxious, on Edge 3 1 1 1   Control/stop worrying 2 0 0 1  Worry too much - different things 3 2 1  0  Trouble relaxing 3 0 2 0  Restless 2 0 1 0  Easily annoyed or irritable 3 3 1 2   Afraid - awful might happen 3 1 2 1   Total GAD 7 Score 19 7 8 5   Anxiety Difficulty Extremely difficult Very difficult Somewhat difficult Somewhat difficult      08/04/2024    3:04 PM 07/29/2024    3:00 PM 01/27/2024   10:57 AM  Depression screen PHQ 2/9  Decreased Interest 2 0 3  Down, Depressed, Hopeless 3 0 2  PHQ - 2 Score 5 0 5  Altered sleeping 3  3  Tired, decreased energy 3  3  Change in appetite 2  3  Feeling bad or failure about yourself  3  2  Trouble concentrating 1  0  Moving slowly or fidgety/restless 0  0  Suicidal thoughts 0  0  PHQ-9 Score 17  16  Difficult doing work/chores Extremely dIfficult  Very difficult      08/04/2024    3:03 PM 01/27/2024   10:58 AM 09/03/2023    8:28 AM 12/01/2022    4:35 PM  GAD 7 : Generalized Anxiety Score  Nervous, Anxious, on Edge 3 1 1 1   Control/stop worrying 2 0 0 1  Worry too much - different things 3 2 1  0  Trouble relaxing 3 0 2 0  Restless 2 0 1 0  Easily annoyed or irritable 3 3 1 2   Afraid - awful might happen 3 1 2 1   Total GAD 7 Score 19 7 8 5   Anxiety Difficulty Extremely difficult Very difficult Somewhat difficult Somewhat difficult   SDOH Screenings   Food Insecurity: No Food Insecurity  (07/05/2024)   Received from [facility]  Housing: Low Risk  (07/05/2024)   Received from [facility]  Transportation Needs: No Transportation Needs (07/05/2024)   Received from [facility]  Utilities: Not At Risk (07/05/2024)   Received from [facility]  Depression [phone]): High Risk (08/04/2024)  Financial Resource Strain: Low Risk  (07/05/2024)   Received from [facility]  Tobacco Use: Low Risk  (08/04/2024)     Physical Exam Constitutional:  Appearance: Normal appearance.  HENT:     Head: Normocephalic and atraumatic.     Right Ear: Tympanic membrane normal. There is no impacted cerumen.     Left Ear: Tympanic membrane normal. There is no impacted cerumen.     Nose:     Comments: Nasal mucosa pale    Mouth/Throat:     Mouth: Mucous membranes are moist.  Neck:     Thyroid : No thyroid  mass or thyroid  tenderness.  Cardiovascular:     Rate and Rhythm: Normal rate and regular rhythm.  Pulmonary:     Effort: Pulmonary effort is normal.     Breath sounds: Normal breath sounds. No wheezing.  Abdominal:     General: Bowel sounds are normal.     Palpations: Abdomen is soft.     Tenderness: There is no guarding.  Musculoskeletal:     Cervical back: Neck supple. No rigidity.     Right lower leg: No edema.     Left lower leg: No edema.  Skin:    General: Skin is warm.     Comments: Mild facial atrophic acne scar noted.  Neurological:     Mental Status: She is alert and oriented to person, place, and time.  Psychiatric:        Mood and Affect: Mood normal.        Behavior: Behavior normal. Behavior is cooperative.        No results found for any visits on 08/04/24.  The ASCVD Risk score (Arnett DK, et al., 2019) failed to calculate for the following reasons:   Unable to determine if patient is Non-Hispanic African American    Following labs discussed, will repeat CMP. Patient not  on NSAIDs.  Component     Latest Ref Rng 01/01/2023 02/11/2024  Sodium     135 - 145 mEq/L 136  137   Potassium     3.5 - 5.1 mEq/L 3.7  3.6   Chloride     96 - 112 mEq/L 99  103   CO2     19 - 32 mEq/L 29  28   Glucose     70 - 99 mg/dL 88  93   BUN     6 - 23 mg/dL 14  14   Creatinine     0.40 - 1.20 mg/dL 9.16  8.99   Total Bilirubin     0.2 - 1.2 mg/dL 0.4  0.5   Alkaline Phosphatase     39 - 117 U/L 47  51   AST     0 - 37 U/L 16  22   ALT     0 - 35 U/L 14  22   Total Protein     6.0 - 8.3 g/dL 6.9  7.2   Albumin     3.5 - 5.2 g/dL 4.5  4.4   GFR     >39.99 mL/min 85.46  67.80   Calcium     8.4 - 10.5 mg/dL 9.7  9.5   Cholesterol     0 - 200 mg/dL 773 (H)  794 (H)   Triglycerides     0.0 - 149.0 mg/dL 871.9  853.9   HDL Cholesterol     >39.00 mg/dL 52.49  50.29   VLDL     0.0 - 40.0 mg/dL 74.3  70.7   LDL (calc)     0 - 99 mg/dL 846 (H)  873 (H)   Total CHOL/HDL Ratio 5  4  NonHDL 178.30  155.01   Hemoglobin A1C     4.6 - 6.5 % 5.4  6.0      Assessment & Plan:   Mild intermittent asthma, unspecified whether complicated Assessment & Plan: Exercise induced wheezing that is better with albuterol  use. Occasionally gets chest tightness that is better with Albuterol  use as well.  Recommend PFT for diagnosis as she has never had PFT done. Pulmonology referral made for PFT, management will be based on her result.   Orders: -     Pulmonary Visit  Hyperlipidemia, unspecified hyperlipidemia type Assessment & Plan: CT coronary score from 2023 was 0  Repeat fasting lipid panel. Lab ordered. Low threshold to start statin given normal CT calcium score.   Orders: -     Lipid panel; Future  Primary hypertension Assessment & Plan: BP well controlled on Losartan -hydrochlorothiazide 50-12.5 mg daily, continue. Check CMP.   Orders: -     Comprehensive metabolic panel with GFR; Future  Abnormal glucose -     Hemoglobin A1c; Future  Acne, unspecified acne  type Assessment & Plan: Chronic and stable on topical Sulfacetamide , refill sent today.   Orders: -     Sulfacetamide  Sodium (Acne); APPLY TOPICALLY TO THE AFFECTED AREA TWICE DAILY AS NEEDED  Dispense: 118 mL; Refill: 5  Non-seasonal allergic rhinitis, unspecified trigger Assessment & Plan: Allergy symptoms year around (likely multifactorial cats, environment). Stable on current treatment with nasal Flonase and Zyrtec 10 mg daily, continue.    Hypothyroidism due to Hashimoto's thyroiditis Assessment & Plan: Continue f/u with endocrinology, management per Dr. Damian    Prediabetes Assessment & Plan: Check A1c      Return in about 3 months (around 11/03/2024) for Fasting lab whenever convinient for patient, f/u with Dr. Abbey in 3 months(asthma, GI).   Luke Abbey, MD

## 2024-08-04 NOTE — Assessment & Plan Note (Signed)
 CT coronary score from 2023 was 0  Repeat fasting lipid panel. Lab ordered. Low threshold to start statin given normal CT calcium score.

## 2024-08-04 NOTE — Assessment & Plan Note (Signed)
 Chronic and stable on topical Sulfacetamide , refill sent today.

## 2024-08-04 NOTE — Assessment & Plan Note (Signed)
 Continue f/u with endocrinology, management per Dr. Solum

## 2024-08-04 NOTE — Assessment & Plan Note (Signed)
 Allergy symptoms year around (likely multifactorial cats, environment). Stable on current treatment with nasal Flonase and Zyrtec 10 mg daily, continue.

## 2024-08-04 NOTE — Assessment & Plan Note (Addendum)
 Exercise induced wheezing that is better with albuterol  use. Occasionally gets chest tightness that is better with Albuterol  use as well.  Recommend PFT for diagnosis as she has never had PFT done. Pulmonology referral made for PFT, management will be based on her result.

## 2024-08-04 NOTE — Assessment & Plan Note (Signed)
 Check A1c.

## 2024-08-05 ENCOUNTER — Encounter: Payer: Self-pay | Admitting: Nurse Practitioner

## 2024-08-05 ENCOUNTER — Ambulatory Visit (INDEPENDENT_AMBULATORY_CARE_PROVIDER_SITE_OTHER): Admitting: Nurse Practitioner

## 2024-08-05 VITALS — BP 110/60 | HR 88 | Temp 98.5°F | Ht 66.5 in | Wt 167.8 lb

## 2024-08-05 DIAGNOSIS — J45990 Exercise induced bronchospasm: Secondary | ICD-10-CM

## 2024-08-05 DIAGNOSIS — G4733 Obstructive sleep apnea (adult) (pediatric): Secondary | ICD-10-CM

## 2024-08-05 NOTE — Assessment & Plan Note (Signed)
 Mild OSA with AHI 5.5/h. Minimal cardiovascular risks associated with mild OSA. Significant daytime burden so shared decision to start CPAP therapy, which she started about 9 days ago. She's having issues with mask/headgear fit. Trialed on AirFit P10 small nasal pillow mask today in office. Placed order for this. She may need small headgear as well but advised to monitor leaks and fit at home. Adjust CPAP settings down to max pressure of 10 cmH2O. Aware of proper use/care of device. Risks/benefits reviewed. Continued healthy weight management. Safe driving practices reviewed.   Patient Instructions  Increase use of CPAP every night, minimum of 4-6 hours a night.  Change equipment as directed. Wash your tubing with warm soap and water  daily, hang to dry. Wash humidifier portion weekly. Use bottled, distilled water  and change daily Be aware of reduced alertness and do not drive or operate heavy machinery if experiencing this or drowsiness.  Exercise encouraged, as tolerated. Healthy weight management discussed.  Avoid or decrease alcohol consumption and medications that make you more sleepy, if possible. Notify if persistent daytime sleepiness occurs even with consistent use of PAP therapy.  Change CPAP supplies... Every month Mask cushions and/or nasal pillows CPAP machine filters Every 3 months Mask frame (not including the headgear) CPAP tubing Every 6 months Mask headgear Chin strap (if applicable) Humidifier water  tub  Switch to AirFit P10 mask in size small - order sent to medical supply company Adjust CPAP settings to 5-10 cmH2O  Continue Albuterol  inhaler 2 puffs every 6 hours as needed for shortness of breath or wheezing. Notify if symptoms persist despite rescue inhaler/neb use.  Set you up for PFT  Stay active   Follow up in 6 weeks after PFT and for compliance CPAP check with Katie Pax Reasoner,NP. If symptoms do not improve or worsen, please contact office for sooner follow up or  seek emergency care.

## 2024-08-05 NOTE — Progress Notes (Signed)
 @Patient  ID: Cynthia Burke, female    DOB: 04-Apr-1976, 48 y.o.   MRN: [id number]  Chief Complaint  Patient presents with   Obstructive Sleep Apnea    OSA on CPAP, struggling with mask  Life has been hitting her so she hasn't been using it as much as she'd hope. However, the mask and head straps have been bothering her with leaks and pressing into her scalp in an awkward position. Currently using a nasal pillow type of mask.     Referring provider: Abbey Bruckner, MD  HPI: 48 year old female, never smoker followed for mild OSA. Past medical history significant for HTN, allergic rhinitis, asthma, IBS, hypothyroid, ADHD, HLD, prediabetes.   TEST/EVENTS:  03/14/2024 HST: AHI 5.5/h, SpO2 low 88%  02/24/2024: OV with Behr Cislo NP for sleep consult Discussed the use of AI scribe software for clinical note transcription with the patient, who gave verbal consent to proceed. Cynthia Burke is a 48 year old female who presents for a sleep consult due to chronic fatigue and snoring. She has experienced chronic fatigue and snoring for over ten years. Despite obtaining a full night's sleep, she feels persistently tired and wakes up at least once during the night, often to use the bathroom or due to hunger. No frequent choking or gasping for air upon waking, and morning headaches are rare. She has experienced sleep paralysis very rarely, about two or three times in her life. She has tried various sleep aids in the past, including hydroxyzine, melatonin and trazodone, but they were either ineffective or caused morning hangover. She did briefly try ambien but does not recall exact response to this. No history of sleep walking. She does not currently take any sleep medications. She has a history of Hashimoto's thyroiditis and reports feeling better on Armour thyroid  compared to Synthroid . Her TSH was last checked in February and within normal limits. She sees an endocrinologist every three to  six months for monitoring. 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 She is on hormone replacement therapy with estrogen. She has discussed the possibility of adding testosterone with her gynecologist but is concerned about the associated risks. She has a family history of mother with pulmonary hypertension. Her mother does have asthma but no other cardiac or pulmonary diseases that she is aware of.  She was diagnosed with asthma at age 52, which is mostly triggered by illness. She uses albuterol  infrequently, about once a month. No wheezing, cough, congestion.  She has a history of high cholesterol since age 16, which is likely familial. She has had a positive ANA test recently, but other inflammatory markers have been negative. She is awaiting workup with rheumatology.  She has been diagnosed with ADHD and is on medication for it.  She consumes caffeine, equivalent to two to three cups of coffee a day, and tries to limit her intake to 200-300 mg per day. She does not consume alcohol. She recently started on semaglutide  for weight management as well as focusing on a meal plan and exercise program, though she finds it challenging to adhere to due to fatigue and her work schedule as a physical therapist.  She's never had a sleep study before. She goes to bed around 10pm-midnight. Falls asleep within an hour. Gets up 1-2 times to use the restroom. Wakes up around 7 am. Does not operate any heavy machinery in her job Animal nutritionist.  [place] 12  05/27/2024: Ov with Sonita Michiels NP  Cynthia Aspiazu Quin Burke is a 48 year old female with mild sleep apnea who presents with excessive daytime fatigue and sleep disturbances. She experiences excessive daytime fatigue and sleep disturbances, and a recent sleep study showed 5.5 events per hour. She feels exhausted and is interested in trying a CPAP machine to improve her sleep quality  and energy levels. She wears a watch to bed, which shows an average of 5 hours and 49 minutes of sleep per night with only 12 minutes of deep sleep over the past week. She is postmenopausal and on hormone replacement therapy, which she believes may affect her sleep. She has been using Nyquil at night and DayQuil during the day, but notes that when she does not take these, her arms becomes very itchy. She is currently taking a daily allergy pill, Zyrtec, but has been on this for many years. She was on Singular in the past but stopped this a few years ago. She has a rash that comes and goes. She has a history of eczema and asthma. Asthma feels well managed at this time.  She is also dealing with a positive ANA, polyarthralgia, and the rash, and is trying to get an appointment with a rheumatologist. She has experienced difficulty scheduling an appointment and is considering different options for care. She feels frustrated with the process and is concerned about her symptoms.   08/05/2024: Today - follow up Discussed the use of AI scribe software for clinical note transcription with the patient, who gave verbal consent to proceed.  History of Present Illness Cynthia Burke is a 48 year old female who presents with issues related to her CPAP mask. She received her CPAP a little over a week ago.  She is experiencing discomfort with her current nasal cradle CPAP mask, which she feels digs into her head and is uncomfortable. She dislikes the way it feels and is seeking an alternative. She tried a nasal mask with a small nasal piece, which she found more comfortable but the strap is still bothering her. She is also using magnetic clips for easier handling of the tube.  She feels better rested with the CPAP use, although she still feels reluctant to get out of bed in the morning. No drowsy driving.   She has a diagnosis of exercise induced asthma but has not undergone pulmonary function tests  previously. She uses an albuterol  inhaler infrequently, about once or twice a month, for exercise-induced symptoms. She is planning to participate in a virtual 5K run. No significant coughing or wheezing and reports decent activity tolerance, although she experiences fatigue. She finds Vyvanse  helpful for her energy levels and manages her caffeine intake to avoid palpitations. Her PCP had wanted to set her up with PFTs and placed referral to pulmonology.   07/05/2024-08/03/2024: CPAP 5-15 cmH2O 9/30 days; 13% >4 hr; average use 4 hr 13 min Pressure 95th 10.9 Leaks 95th 29.1 AHI 9.1    Allergies  Allergen Reactions   Amlodipine      Swelling  Other reaction(s): Other (See Comments) Swelling Swelling   Bupropion Other (See Comments)    Seizures   Other reaction(s): Other (See Comments), Other (See Comments), Other (See Comments) Seizures  seizure Seizures    Spironolactone      ?eyelid swelling worse at 50mg  dose   Latex Other (See Comments) and Hives    Itching, sneezing Other reaction(s): Other (See Comments) Itching, sneezing Itching, sneezing Itching, sneezing  Itching, sneezing  Other Other (See Comments)    Tree pollen   Other reaction(s): Other (See Comments), Other (See Comments) Tree pollen  unknown Tree pollen     Immunization History  Administered Date(s) Administered   Influenza, Seasonal, Injecte, Preservative Fre 09/04/2023   Influenza,inj,Quad PF,6+ Mos 10/27/2017, 09/02/2018, 10/31/2021, 07/04/2022   Influenza-Unspecified 10/27/2017, 10/27/2017   Moderna SARS-COV2 Booster Vaccination 06/13/2021   PFIZER(Purple Top)SARS-COV-2 Vaccination 03/03/2020, 03/30/2020   PPD Test 06/26/2021   Td (Adult),5 Lf Tetanus Toxid, Preservative Free 05/26/2016   Tdap 09/02/2018    Past Medical History:  Diagnosis Date   ADD (attention deficit disorder)    Allergy    Anemia    Anemia 12/21/2018   Annual physical exam 06/03/2019   Anxiety and depression  09/02/2018   Arthralgia 03/26/2020   Formatting of this note might be different from the original.  April 2021     Last Assessment & Plan:   Formatting of this note might be different from the original.  Work-up April 2020: normal Lyme/RMSF/ANA/Celiac panel /CK   Asthma    BMI 28.0-28.9,adult 07/04/2022   COVID-19    Depression    Hashimoto's disease    Hashimoto's thyroiditis    History of chicken pox    History of UTI    HLD (hyperlipidemia) 09/02/2018   Hyperlipidemia    Need for hepatitis B screening test 03/26/2020   Premature ovarian insufficiency    [facility] 29 Dr. Carroll    Rash 03/08/2020   Seizures (HCC)    medication induced wellbutrin   Thumb pain, left 09/02/2018    Tobacco History: Social History   Tobacco Use  Smoking Status Never  Smokeless Tobacco Never   Counseling given: Not Answered   Outpatient Medications Prior to Visit  Medication Sig Dispense Refill   albuterol  (VENTOLIN  HFA) 108 (90 Base) MCG/ACT inhaler Inhale 1-2 puffs into the lungs every 6 (six) hours as needed for wheezing or shortness of breath. 1 g 12   Calcium Carbonate-Vit D-Min (CALTRATE 600+D PLUS PO) Take 4 capsules by mouth daily.     Cetirizine HCl (ZYRTEC PO) Take 1 tablet by mouth daily.     escitalopram (LEXAPRO) 10 MG tablet Take 10 mg by mouth daily.     estradiol (ESTRACE) 0.1 MG/GM vaginal cream Place vaginally.     estradiol (ESTRACE) 1 MG tablet Take 1 mg by mouth daily.     famotidine (PEPCID) 20 MG tablet Take 20 mg by mouth daily as needed for heartburn.     fluticasone (FLONASE) 50 MCG/ACT nasal spray Place 2 sprays into both nostrils daily.     linaclotide  (LINZESS ) 145 MCG CAPS capsule Take 1 capsule (145 mcg total) by mouth daily before breakfast. 90 capsule 3   lisdexamfetamine (VYVANSE ) 70 MG capsule Take 70 mg by mouth daily.     losartan -hydrochlorothiazide (HYZAAR) 50-12.5 MG tablet Take 1 tablet by mouth every morning. 90 tablet 3   semaglutide -weight management  (WEGOVY ) 0.25 MG/0.5ML SOAJ SQ injection ADMINISTER 0.25MG  UNDER THE SKIN 1 TIME A WEEK     Sulfacetamide  Sodium, Acne, 10 % LOTN APPLY TOPICALLY TO THE AFFECTED AREA TWICE DAILY AS NEEDED 118 mL 5   thyroid  (ARMOUR) 90 MG tablet Take one tablet SIX days per week. Take two tablets on Sundays.     No facility-administered medications prior to visit.     Review of Systems: As above     Physical Exam:  BP 110/60   Pulse 88   Temp 98.5 F (36.9  C)   Ht 5' 6.5 (1.689 m)   Wt 167 lb 12.8 oz (76.1 kg)   LMP 09/10/2018   SpO2 98%   BMI 26.68 kg/m   GEN: Pleasant, interactive, well-appearing; in no acute distress HEENT:  Normocephalic and atraumatic. PERRLA. Sclera white. Nasal turbinates pink, moist and patent bilaterally. No rhinorrhea present. Oropharynx pink and moist, without exudate or edema. No lesions, ulcerations, or postnasal drip. Mallampati II/III NECK:  Supple w/ fair ROM. No lymphadenopathy.   CV: RRR, no m/r/g, no peripheral edema. Pulses intact, +2 bilaterally. No cyanosis, pallor or clubbing. PULMONARY:  Unlabored, regular breathing. Clear bilaterally A&P w/o wheezes/rales/rhonchi. No accessory muscle use.  GI: BS present and normoactive. Soft, non-tender to palpation.  MSK: No erythema, warmth or tenderness. Cap refil <2 sec all extrem.  Neuro: A/Ox3. No focal deficits noted.   Skin: Warm, dry. Scaly rash to LUE Psych: Normal affect and behavior. Judgement and thought content appropriate.     Lab Results:  CBC    Component Value Date/Time   WBC 5.8 07/29/2024 1431   WBC 10.4 01/27/2024 1357   RBC 4.66 07/29/2024 1431   HGB 14.3 07/29/2024 1431   HCT 41.2 07/29/2024 1431   PLT 428 (H) 07/29/2024 1431   MCV 88.4 07/29/2024 1431   MCH 30.7 07/29/2024 1431   MCHC 34.7 07/29/2024 1431   RDW 12.2 07/29/2024 1431   LYMPHSABS 3.0 01/27/2024 1357   MONOABS 0.8 01/27/2024 1357   EOSABS 0.3 01/27/2024 1357   BASOSABS 0.1 01/27/2024 1357    BMET    Component  Value Date/Time   NA 137 02/11/2024 0934   K 3.6 02/11/2024 0934   CL 103 02/11/2024 0934   CO2 28 02/11/2024 0934   GLUCOSE 93 02/11/2024 0934   BUN 14 02/11/2024 0934   CREATININE 1.00 02/11/2024 0934   CALCIUM 9.5 02/11/2024 0934   GFRNONAA >60 02/28/2020 0930   GFRAA >60 02/28/2020 0930    BNP No results found for: BNP   Imaging:  No results found.   Administration History     None           No data to display          No results found for: NITRICOXIDE      Assessment & Plan:   Mild obstructive sleep apnea Mild OSA with AHI 5.5/h. Minimal cardiovascular risks associated with mild OSA. Significant daytime burden so shared decision to start CPAP therapy, which she started about 9 days ago. She's having issues with mask/headgear fit. Trialed on AirFit P10 small nasal pillow mask today in office. Placed order for this. She may need small headgear as well but advised to monitor leaks and fit at home. Adjust CPAP settings down to max pressure of 10 cmH2O. Aware of proper use/care of device. Risks/benefits reviewed. Continued healthy weight management. Safe driving practices reviewed.   Patient Instructions  Increase use of CPAP every night, minimum of 4-6 hours a night.  Change equipment as directed. Wash your tubing with warm soap and water  daily, hang to dry. Wash humidifier portion weekly. Use bottled, distilled water  and change daily Be aware of reduced alertness and do not drive or operate heavy machinery if experiencing this or drowsiness.  Exercise encouraged, as tolerated. Healthy weight management discussed.  Avoid or decrease alcohol consumption and medications that make you more sleepy, if possible. Notify if persistent daytime sleepiness occurs even with consistent use of PAP therapy.  Change CPAP supplies... Every month Mask cushions  and/or nasal pillows CPAP machine filters Every 3 months Mask frame (not including the headgear) CPAP  tubing Every 6 months Mask headgear Chin strap (if applicable) Humidifier water  tub  Switch to AirFit P10 mask in size small - order sent to medical supply company Adjust CPAP settings to 5-10 cmH2O  Continue Albuterol  inhaler 2 puffs every 6 hours as needed for shortness of breath or wheezing. Notify if symptoms persist despite rescue inhaler/neb use.  Set you up for PFT  Stay active   Follow up in 6 weeks after PFT and for compliance CPAP check with Katie Paulino Cork,NP. If symptoms do not improve or worsen, please contact office for sooner follow up or seek emergency care.     Asthma Exercise induced asthma. Will set her up for PFT for baseline. Good functional capacity per her report. Action plan in place. Continue PRN SABA. No indication for maintenance therapy at this point.     Advised if symptoms do not improve or worsen, to please contact office for sooner follow up or seek emergency care.   I spent 35 minutes of dedicated to the care of this patient on the date of this encounter to include pre-visit review of records, face-to-face time with the patient discussing conditions above, post visit ordering of testing, clinical documentation with the electronic health record, making appropriate referrals as documented, and communicating necessary findings to members of the patients care team.  Comer LULLA Rouleau, NP 08/05/2024  Pt aware and understands NP's role.

## 2024-08-05 NOTE — Patient Instructions (Addendum)
 Increase use of CPAP every night, minimum of 4-6 hours a night.  Change equipment as directed. Wash your tubing with warm soap and water  daily, hang to dry. Wash humidifier portion weekly. Use bottled, distilled water  and change daily Be aware of reduced alertness and do not drive or operate heavy machinery if experiencing this or drowsiness.  Exercise encouraged, as tolerated. Healthy weight management discussed.  Avoid or decrease alcohol consumption and medications that make you more sleepy, if possible. Notify if persistent daytime sleepiness occurs even with consistent use of PAP therapy.  Change CPAP supplies... Every month Mask cushions and/or nasal pillows CPAP machine filters Every 3 months Mask frame (not including the headgear) CPAP tubing Every 6 months Mask headgear Chin strap (if applicable) Humidifier water  tub  Switch to AirFit P10 mask in size small - order sent to medical supply company Adjust CPAP settings to 5-10 cmH2O  Continue Albuterol  inhaler 2 puffs every 6 hours as needed for shortness of breath or wheezing. Notify if symptoms persist despite rescue inhaler/neb use.  Set you up for PFT  Stay active   Follow up in 6 weeks after PFT and for compliance CPAP check with Katie Cowen Pesqueira,NP. If symptoms do not improve or worsen, please contact office for sooner follow up or seek emergency care.

## 2024-08-05 NOTE — Assessment & Plan Note (Signed)
 Exercise induced asthma. Will set her up for PFT for baseline. Good functional capacity per her report. Action plan in place. Continue PRN SABA. No indication for maintenance therapy at this point.

## 2024-08-08 LAB — JAK2 V617F RFX CALR/MPL/E12-15

## 2024-08-08 LAB — CALR +MPL + E12-E15  (REFLEX)

## 2024-08-09 ENCOUNTER — Other Ambulatory Visit: Payer: Self-pay

## 2024-08-09 DIAGNOSIS — L709 Acne, unspecified: Secondary | ICD-10-CM

## 2024-08-09 MED ORDER — CLINDAMYCIN PHOS (TWICE-DAILY) 1 % EX GEL: Freq: Every day | CUTANEOUS | 3 refills | Status: DC

## 2024-08-09 NOTE — Telephone Encounter (Signed)
 1. Acne, unspecified acne type - clindamycin (CLINDAGEL) 1 % gel; Apply topically daily.  Dispense: 60 g; Refill: 3 - Please let the patient know that Sulfacetamide  sodium 10% lotion is no longer available in pharmacy. I recommend switching to Clindamycin 1% gel use a thin layer to face once daily.   Luke Shade, MD

## 2024-08-10 ENCOUNTER — Other Ambulatory Visit

## 2024-08-10 ENCOUNTER — Other Ambulatory Visit (INDEPENDENT_AMBULATORY_CARE_PROVIDER_SITE_OTHER)

## 2024-08-10 DIAGNOSIS — I10 Essential (primary) hypertension: Secondary | ICD-10-CM

## 2024-08-10 DIAGNOSIS — E785 Hyperlipidemia, unspecified: Secondary | ICD-10-CM

## 2024-08-10 DIAGNOSIS — R7309 Other abnormal glucose: Secondary | ICD-10-CM

## 2024-08-10 NOTE — Addendum Note (Signed)
 Addended by: MARYLEN PRO A on: 08/10/2024 09:03 AM   Modules accepted: Orders

## 2024-08-11 ENCOUNTER — Ambulatory Visit: Payer: Self-pay

## 2024-08-11 LAB — COMPREHENSIVE METABOLIC PANEL WITH GFR
AG Ratio: 1.9 (calc) (ref 1.0–2.5)
ALT: 14 U/L (ref 6–29)
AST: 17 U/L (ref 10–35)
Albumin: 4.3 g/dL (ref 3.6–5.1)
Alkaline phosphatase (APISO): 49 U/L (ref 31–125)
BUN: 13 mg/dL (ref 7–25)
CO2: 30 mmol/L (ref 20–32)
Calcium: 9.2 mg/dL (ref 8.6–10.2)
Chloride: 99 mmol/L (ref 98–110)
Creat: 0.93 mg/dL (ref 0.50–0.99)
Globulin: 2.3 g/dL (ref 1.9–3.7)
Glucose, Bld: 89 mg/dL (ref 65–99)
Potassium: 3.6 mmol/L (ref 3.5–5.3)
Sodium: 136 mmol/L (ref 135–146)
Total Bilirubin: 0.4 mg/dL (ref 0.2–1.2)
Total Protein: 6.6 g/dL (ref 6.1–8.1)
eGFR: 77 mL/min/1.73m2 (ref >=60)

## 2024-08-11 LAB — HEMOGLOBIN A1C
Hgb A1c MFr Bld: 5.3 % (ref <5.7)
Mean Plasma Glucose: 105 mg/dL
eAG (mmol/L): 5.8 mmol/L

## 2024-08-11 LAB — LIPID PANEL
Cholesterol: 219 mg/dL — ABNORMAL HIGH (ref <200)
HDL: 55 mg/dL (ref >=50)
LDL Cholesterol (Calc): 143 mg/dL — ABNORMAL HIGH
Non-HDL Cholesterol (Calc): 164 mg/dL — ABNORMAL HIGH (ref <130)
Total CHOL/HDL Ratio: 4 (calc) (ref <5.0)
Triglycerides: 101 mg/dL (ref <150)

## 2024-08-17 ENCOUNTER — Ambulatory Visit: Admitting: Internal Medicine

## 2024-08-23 ENCOUNTER — Ambulatory Visit: Payer: Self-pay

## 2024-08-23 ENCOUNTER — Encounter: Payer: Self-pay | Admitting: Oncology

## 2024-08-23 DIAGNOSIS — D509 Iron deficiency anemia, unspecified: Secondary | ICD-10-CM | POA: Diagnosis not present

## 2024-08-23 DIAGNOSIS — K2960 Other gastritis without bleeding: Secondary | ICD-10-CM | POA: Diagnosis not present

## 2024-08-23 DIAGNOSIS — K640 First degree hemorrhoids: Secondary | ICD-10-CM | POA: Diagnosis not present

## 2024-08-23 DIAGNOSIS — K625 Hemorrhage of anus and rectum: Secondary | ICD-10-CM | POA: Diagnosis present

## 2024-09-07 ENCOUNTER — Ambulatory Visit: Admitting: Nurse Practitioner

## 2024-09-07 ENCOUNTER — Ambulatory Visit (INDEPENDENT_AMBULATORY_CARE_PROVIDER_SITE_OTHER)

## 2024-09-07 ENCOUNTER — Encounter: Payer: Self-pay | Admitting: Nurse Practitioner

## 2024-09-07 VITALS — BP 104/74 | HR 87 | Temp 98.1°F | Ht 66.5 in | Wt 168.0 lb

## 2024-09-07 DIAGNOSIS — G4733 Obstructive sleep apnea (adult) (pediatric): Secondary | ICD-10-CM | POA: Diagnosis not present

## 2024-09-07 DIAGNOSIS — J45990 Exercise induced bronchospasm: Secondary | ICD-10-CM | POA: Diagnosis not present

## 2024-09-07 DIAGNOSIS — J4520 Mild intermittent asthma, uncomplicated: Secondary | ICD-10-CM

## 2024-09-07 LAB — PULMONARY FUNCTION TEST
DL/VA % pred: 107 %
DL/VA: 4.58 ml/min/mmHg/L
DLCO unc % pred: 101 %
DLCO unc: 23.46 ml/min/mmHg
FEF 25-75 Post: 3.5 L/s
FEF 25-75 Pre: 3.09 L/s
FEF2575-%Change-Post: 13 %
FEF2575-%Pred-Post: 114 %
FEF2575-%Pred-Pre: 100 %
FEV1-%Change-Post: 0 %
FEV1-%Pred-Post: 101 %
FEV1-%Pred-Pre: 100 %
FEV1-Post: 3.17 L
FEV1-Pre: 3.15 L
FEV1FVC-%Change-Post: 2 %
FEV1FVC-%Pred-Pre: 101 %
FEV6-%Change-Post: -1 %
FEV6-%Pred-Post: 98 %
FEV6-%Pred-Pre: 100 %
FEV6-Post: 3.77 L
FEV6-Pre: 3.84 L
FEV6FVC-%Pred-Post: 102 %
FEV6FVC-%Pred-Pre: 102 %
FVC-%Change-Post: -1 %
FVC-%Pred-Post: 96 %
FVC-%Pred-Pre: 98 %
FVC-Post: 3.77 L
FVC-Pre: 3.84 L
Post FEV1/FVC ratio: 84 %
Post FEV6/FVC ratio: 100 %
Pre FEV1/FVC ratio: 82 %
Pre FEV6/FVC Ratio: 100 %
RV % pred: 76 %
RV: 1.41 L
TLC % pred: 94 %
TLC: 5.11 L

## 2024-09-07 NOTE — Assessment & Plan Note (Signed)
 Exercise induced asthma. PFT normal. Good functional capacity per her report. Action plan in place. Continue PRN SABA. No indication for maintenance therapy at this point.

## 2024-09-07 NOTE — Patient Instructions (Signed)
 Full PFT completed today ? ?

## 2024-09-07 NOTE — Assessment & Plan Note (Signed)
 Mild OSA with AHI 5.5/h. Minimal cardiovascular risks associated with mild OSA. Significant daytime burden so shared decision to start CPAP therapy with improvement in symptoms.  Aware of proper use/care of device. Risks/benefits reviewed. Continued healthy weight management. Safe driving practices reviewed.   Patient Instructions  Continue to use CPAP every night, minimum of 4-6 hours a night.  Change equipment as directed. Wash your tubing with warm soap and water  daily, hang to dry. Wash humidifier portion weekly. Use bottled, distilled water  and change daily Be aware of reduced alertness and do not drive or operate heavy machinery if experiencing this or drowsiness.  Exercise encouraged, as tolerated. Healthy weight management discussed.  Avoid or decrease alcohol consumption and medications that make you more sleepy, if possible. Notify if persistent daytime sleepiness occurs even with consistent use of PAP therapy.  Change CPAP supplies... Every month Mask cushions and/or nasal pillows CPAP machine filters Every 3 months Mask frame (not including the headgear) CPAP tubing Every 6 months Mask headgear Chin strap (if applicable) Humidifier water  tub   Continue Albuterol  inhaler 2 puffs every 6 hours as needed for shortness of breath or wheezing. Notify if symptoms persist despite rescue inhaler/neb use.  Stay active    Follow up in one year for compliance CPAP check with Cynthia Jariyah Hackley,NP. If symptoms do not improve or worsen, please contact office for sooner follow up or seek emergency care.

## 2024-09-07 NOTE — Progress Notes (Signed)
 @Patient  ID: Cynthia Burke, female    DOB: April 26, 1976, 48 y.o.   MRN: [id number]  Chief Complaint  Patient presents with   Asthma    Shortness of breath on exertion. Occasional having trouble taking a deep breath.    Obstructive Sleep Apnea    Using CPAP nightly. Mask is rubbing nose, some times.     Referring provider: Malachy Comer GAILS, NP  HPI: 48 year old female, never smoker followed for mild OSA and exercise induced asthma. Past medical history significant for HTN, allergic rhinitis, asthma, IBS, hypothyroid, ADHD, HLD, prediabetes.   TEST/EVENTS:  03/14/2024 HST: AHI 5.5/h, SpO2 low 88% 09/07/2024 PFT: FVC 98, FEV1 100, ratio 84, TLC 94, DLCO 101. Normal PFT. Reviewed today, 09/07/2024  02/24/2024: SHERLEAN with Dylana Shaw NP for sleep consult Discussed the use of AI scribe software for clinical note transcription with the patient, who gave verbal consent to proceed. Cynthia Burke is a 48 year old female who presents for a sleep consult due to chronic fatigue and snoring. She has experienced chronic fatigue and snoring for over ten years. Despite obtaining a full night's sleep, she feels persistently tired and wakes up at least once during the night, often to use the bathroom or due to hunger. No frequent choking or gasping for air upon waking, and morning headaches are rare. She has experienced sleep paralysis very rarely, about two or three times in her life. She has tried various sleep aids in the past, including hydroxyzine, melatonin and trazodone, but they were either ineffective or caused morning hangover. She did briefly try ambien but does not recall exact response to this. No history of sleep walking. She does not currently take any sleep medications. She has a history of Hashimoto's thyroiditis and reports feeling better on Armour thyroid  compared to Synthroid . Her TSH was last checked in February and within normal limits. She sees an endocrinologist every three  to six months for monitoring. 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 She is on hormone replacement therapy with estrogen. She has discussed the possibility of adding testosterone with her gynecologist but is concerned about the associated risks. She has a family history of mother with pulmonary hypertension. Her mother does have asthma but no other cardiac or pulmonary diseases that she is aware of.  She was diagnosed with asthma at age 75, which is mostly triggered by illness. She uses albuterol  infrequently, about once a month. No wheezing, cough, congestion.  She has a history of high cholesterol since age 46, which is likely familial. She has had a positive ANA test recently, but other inflammatory markers have been negative. She is awaiting workup with rheumatology.  She has been diagnosed with ADHD and is on medication for it.  She consumes caffeine, equivalent to two to three cups of coffee a day, and tries to limit her intake to 200-300 mg per day. She does not consume alcohol. She recently started on semaglutide  for weight management as well as focusing on a meal plan and exercise program, though she finds it challenging to adhere to due to fatigue and her work schedule as a physical therapist.  She's never had a sleep study before. She goes to bed around 10pm-midnight. Falls asleep within an hour. Gets up 1-2 times to use the restroom. Wakes up around 7 am. Does not operate any heavy machinery in her job animal nutritionist.  [place] 12  05/27/2024: Ov with Ali Mclaurin NP  Cynthia Aspiazu Quin Burke is a 48 year old female with mild sleep apnea who presents with excessive daytime fatigue and sleep disturbances. She experiences excessive daytime fatigue and sleep disturbances, and a recent sleep study showed 5.5 events per hour. She feels exhausted and is interested in trying a CPAP machine to improve her sleep  quality and energy levels. She wears a watch to bed, which shows an average of 5 hours and 49 minutes of sleep per night with only 12 minutes of deep sleep over the past week. She is postmenopausal and on hormone replacement therapy, which she believes may affect her sleep. She has been using Nyquil at night and DayQuil during the day, but notes that when she does not take these, her arms becomes very itchy. She is currently taking a daily allergy pill, Zyrtec, but has been on this for many years. She was on Singular in the past but stopped this a few years ago. She has a rash that comes and goes. She has a history of eczema and asthma. Asthma feels well managed at this time.  She is also dealing with a positive ANA, polyarthralgia, and the rash, and is trying to get an appointment with a rheumatologist. She has experienced difficulty scheduling an appointment and is considering different options for care. She feels frustrated with the process and is concerned about her symptoms.   08/05/2024: Ov with Cleopatra Sardo NP. Cynthia Burke is a 48 year old female who presents with issues related to her CPAP mask. She received her CPAP a little over a week ago. She is experiencing discomfort with her current nasal cradle CPAP mask, which she feels digs into her head and is uncomfortable. She dislikes the way it feels and is seeking an alternative. She tried a nasal mask with a small nasal piece, which she found more comfortable but the strap is still bothering her. She is also using magnetic clips for easier handling of the tube. She feels better rested with the CPAP use, although she still feels reluctant to get out of bed in the morning. No drowsy driving.  She has a diagnosis of exercise induced asthma but has not undergone pulmonary function tests previously. She uses an albuterol  inhaler infrequently, about once or twice a month, for exercise-induced symptoms. She is planning to participate in a virtual  5K run. No significant coughing or wheezing and reports decent activity tolerance, although she experiences fatigue. She finds Vyvanse  helpful for her energy levels and manages her caffeine intake to avoid palpitations. Her PCP had wanted to set her up with PFTs and placed referral to pulmonology.  07/05/2024-08/03/2024: CPAP 5-15 cmH2O 9/30 days; 13% >4 hr; average use 4 hr 13 min Pressure 95th 10.9 Leaks 95th 29.1 AHI 9.1  09/07/2024: Today - follow up Discussed the use of AI scribe software for clinical note transcription with the patient, who gave verbal consent to proceed.  History of Present Illness  Cynthia Burke is a 48 year old female with exercise-induced asthma and sleep apnea who presents for a follow-up after PFT.   She consistently uses her CPAP machine, which has improved her sleep quality, though she continues to experience daytime fatigue which is related to Hashimoto's, early menopause and potentially an other autoimmune disease that she is being evaluated for.   Her new CPAP mask is much more comfortable. Pressures feel good. No issues with drowsy driving, sleep parasomnias or morning headaches. Sleep is  more restful with CPAP.   She has exercise-induced asthma and her recent lung function testing was normal. She experiences occasional tightness with exertion or anxiety and uses albuterol  as needed, particularly with exercise, without requiring daily medication. Infrequent use of the albuterol . No wheezing, congestion, shortness of breath, PND.     Allergies  Allergen Reactions   Amlodipine      Swelling  Other reaction(s): Other (See Comments) Swelling Swelling   Bupropion Other (See Comments)    Seizures   Other reaction(s): Other (See Comments), Other (See Comments), Other (See Comments) Seizures  seizure Seizures    Spironolactone      ?eyelid swelling worse at 50mg  dose   Latex Other (See Comments) and Hives    Itching, sneezing Other  reaction(s): Other (See Comments) Itching, sneezing Itching, sneezing Itching, sneezing  Itching, sneezing   Other Other (See Comments)    Tree pollen   Other reaction(s): Other (See Comments), Other (See Comments) Tree pollen  unknown Tree pollen     Immunization History  Administered Date(s) Administered   Influenza, Seasonal, Injecte, Preservative Fre 09/04/2023   Influenza,inj,Quad PF,6+ Mos 10/27/2017, 09/02/2018, 10/31/2021, 07/04/2022   Influenza-Unspecified 10/27/2017, 10/27/2017   Moderna SARS-COV2 Booster Vaccination 06/13/2021   PFIZER(Purple Top)SARS-COV-2 Vaccination 03/03/2020, 03/30/2020   PPD Test 06/26/2021   Td (Adult),5 Lf Tetanus Toxid, Preservative Free 05/26/2016   Tdap 09/02/2018    Past Medical History:  Diagnosis Date   ADD (attention deficit disorder)    Allergy    Anemia    Anemia 12/21/2018   Annual physical exam 06/03/2019   Anxiety and depression 09/02/2018   Arthralgia 03/26/2020   Formatting of this note might be different from the original.  April 2021     Last Assessment & Plan:   Formatting of this note might be different from the original.  Work-up April 2020: normal Lyme/RMSF/ANA/Celiac panel /CK   Asthma    BMI 28.0-28.9,adult 07/04/2022   COVID-19    Depression    Hashimoto's disease    Hashimoto's thyroiditis    History of chicken pox    History of UTI    HLD (hyperlipidemia) 09/02/2018   Hyperlipidemia    Need for hepatitis B screening test 03/26/2020   Premature ovarian insufficiency    [facility] 29 Dr. Carroll    Rash 03/08/2020   Seizures (HCC)    medication induced wellbutrin   Thumb pain, left 09/02/2018    Tobacco History: Social History   Tobacco Use  Smoking Status Never  Smokeless Tobacco Never   Counseling given: Not Answered   Outpatient Medications Prior to Visit  Medication Sig Dispense Refill   albuterol  (VENTOLIN  HFA) 108 (90 Base) MCG/ACT inhaler Inhale 1-2 puffs into the lungs every 6 (six)  hours as needed for wheezing or shortness of breath. 1 g 12   Calcium Carbonate-Vit D-Min (CALTRATE 600+D PLUS PO) Take 4 capsules by mouth daily.     Cetirizine HCl (ZYRTEC PO) Take 1 tablet by mouth daily.     escitalopram (LEXAPRO) 10 MG tablet Take 10 mg by mouth daily.     estradiol (ESTRACE) 0.1 MG/GM vaginal cream Place vaginally.     estradiol (ESTRACE) 1 MG tablet Take 1 mg by mouth daily.     famotidine (PEPCID) 20 MG tablet Take 20 mg by mouth daily as needed for heartburn.     fluticasone (FLONASE) 50 MCG/ACT nasal spray Place 2 sprays into both nostrils daily.     linaclotide  (LINZESS ) 145 MCG CAPS capsule Take  1 capsule (145 mcg total) by mouth daily before breakfast. (Patient taking differently: Take 145 mcg by mouth daily before breakfast. PRN) 90 capsule 3   lisdexamfetamine (VYVANSE ) 70 MG capsule Take 70 mg by mouth daily.     losartan -hydrochlorothiazide (HYZAAR) 50-12.5 MG tablet Take 1 tablet by mouth every morning. 90 tablet 3   semaglutide -weight management (WEGOVY ) 0.25 MG/0.5ML SOAJ SQ injection ADMINISTER 0.25MG  UNDER THE SKIN 1 TIME A WEEK     thyroid  (ARMOUR) 90 MG tablet Take one tablet SIX days per week. Take two tablets on Sundays.     clindamycin (CLINDAGEL) 1 % gel Apply topically daily. (Patient not taking: Reported on 09/07/2024) 60 g 3   No facility-administered medications prior to visit.     Review of Systems: As above     Physical Exam:  BP 104/74   Pulse 87   Temp 98.1 F (36.7 C) (Temporal)   Ht 5' 6.5 (1.689 m)   Wt 168 lb (76.2 kg)   LMP 09/10/2018   SpO2 98%   BMI 26.71 kg/m   GEN: Pleasant, interactive, well-appearing; in no acute distress HEENT:  Normocephalic and atraumatic. PERRLA. Sclera white. Nasal turbinates pink, moist and patent bilaterally. No rhinorrhea present. Oropharynx pink and moist, without exudate or edema. No lesions, ulcerations, or postnasal drip. Mallampati II/III NECK:  Supple w/ fair ROM. No lymphadenopathy.    CV: RRR, no m/r/g, no peripheral edema. Pulses intact, +2 bilaterally. No cyanosis, pallor or clubbing. PULMONARY:  Unlabored, regular breathing. Clear bilaterally A&P w/o wheezes/rales/rhonchi. No accessory muscle use.  GI: BS present and normoactive. Soft, non-tender to palpation.  MSK: No erythema, warmth or tenderness. Cap refil <2 sec all extrem.  Neuro: A/Ox3. No focal deficits noted.   Skin: Warm, dry, intact  Psych: Normal affect and behavior. Judgement and thought content appropriate.     Lab Results:  CBC    Component Value Date/Time   WBC 5.8 07/29/2024 1431   WBC 10.4 01/27/2024 1357   RBC 4.66 07/29/2024 1431   HGB 14.3 07/29/2024 1431   HCT 41.2 07/29/2024 1431   PLT 428 (H) 07/29/2024 1431   MCV 88.4 07/29/2024 1431   MCH 30.7 07/29/2024 1431   MCHC 34.7 07/29/2024 1431   RDW 12.2 07/29/2024 1431   LYMPHSABS 3.0 01/27/2024 1357   MONOABS 0.8 01/27/2024 1357   EOSABS 0.3 01/27/2024 1357   BASOSABS 0.1 01/27/2024 1357    BMET    Component Value Date/Time   NA 136 08/10/2024 0904   K 3.6 08/10/2024 0904   CL 99 08/10/2024 0904   CO2 30 08/10/2024 0904   GLUCOSE 89 08/10/2024 0904   BUN 13 08/10/2024 0904   CREATININE 0.93 08/10/2024 0904   CALCIUM 9.2 08/10/2024 0904   GFRNONAA >60 02/28/2020 0930   GFRAA >60 02/28/2020 0930    BNP No results found for: BNP   Imaging:  No results found.   Administration History     None          Latest Ref Rng & Units 09/07/2024    2:33 PM  PFT Results  FVC-Pre L 3.84  P  FVC-Predicted Pre % 98  P  FVC-Post L 3.77  P  FVC-Predicted Post % 96  P  Pre FEV1/FVC % % 82  P  Post FEV1/FCV % % 84  P  FEV1-Pre L 3.15  P  FEV1-Predicted Pre % 100  P  FEV1-Post L 3.17  P  DLCO uncorrected ml/min/mmHg 23.46  P  DLCO UNC% % 101  P  DLVA Predicted % 107  P  TLC L 5.11  P  TLC % Predicted % 94  P  RV % Predicted % 76  P    P Preliminary result    No results found for:  NITRICOXIDE      Assessment & Plan:   Mild obstructive sleep apnea Mild OSA with AHI 5.5/h. Minimal cardiovascular risks associated with mild OSA. Significant daytime burden so shared decision to start CPAP therapy with improvement in symptoms.  Aware of proper use/care of device. Risks/benefits reviewed. Continued healthy weight management. Safe driving practices reviewed.   Patient Instructions  Continue to use CPAP every night, minimum of 4-6 hours a night.  Change equipment as directed. Wash your tubing with warm soap and water  daily, hang to dry. Wash humidifier portion weekly. Use bottled, distilled water  and change daily Be aware of reduced alertness and do not drive or operate heavy machinery if experiencing this or drowsiness.  Exercise encouraged, as tolerated. Healthy weight management discussed.  Avoid or decrease alcohol consumption and medications that make you more sleepy, if possible. Notify if persistent daytime sleepiness occurs even with consistent use of PAP therapy.  Change CPAP supplies... Every month Mask cushions and/or nasal pillows CPAP machine filters Every 3 months Mask frame (not including the headgear) CPAP tubing Every 6 months Mask headgear Chin strap (if applicable) Humidifier water  tub   Continue Albuterol  inhaler 2 puffs every 6 hours as needed for shortness of breath or wheezing. Notify if symptoms persist despite rescue inhaler/neb use.  Stay active    Follow up in one year for compliance CPAP check with Katie Jurney Overacker,NP. If symptoms do not improve or worsen, please contact office for sooner follow up or seek emergency care.   Asthma Exercise induced asthma. PFT normal. Good functional capacity per her report. Action plan in place. Continue PRN SABA. No indication for maintenance therapy at this point.      Advised if symptoms do not improve or worsen, to please contact office for sooner follow up or seek emergency care.   I spent 25  minutes of dedicated to the care of this patient on the date of this encounter to include pre-visit review of records, face-to-face time with the patient discussing conditions above, post visit ordering of testing, clinical documentation with the electronic health record, making appropriate referrals as documented, and communicating necessary findings to members of the patients care team.  Comer LULLA Rouleau, NP 09/07/2024  Pt aware and understands NP's role.

## 2024-09-07 NOTE — Progress Notes (Signed)
 Full PFT completed today ? ?

## 2024-09-07 NOTE — Patient Instructions (Addendum)
 Continue to use CPAP every night, minimum of 4-6 hours a night.  Change equipment as directed. Wash your tubing with warm soap and water  daily, hang to dry. Wash humidifier portion weekly. Use bottled, distilled water  and change daily Be aware of reduced alertness and do not drive or operate heavy machinery if experiencing this or drowsiness.  Exercise encouraged, as tolerated. Healthy weight management discussed.  Avoid or decrease alcohol consumption and medications that make you more sleepy, if possible. Notify if persistent daytime sleepiness occurs even with consistent use of PAP therapy.  Change CPAP supplies... Every month Mask cushions and/or nasal pillows CPAP machine filters Every 3 months Mask frame (not including the headgear) CPAP tubing Every 6 months Mask headgear Chin strap (if applicable) Humidifier water  tub   Continue Albuterol  inhaler 2 puffs every 6 hours as needed for shortness of breath or wheezing. Notify if symptoms persist despite rescue inhaler/neb use.  Stay active    Follow up in one year for compliance CPAP check with Cynthia Aleane Wesenberg,NP. If symptoms do not improve or worsen, please contact office for sooner follow up or seek emergency care.

## 2024-09-15 NOTE — Progress Notes (Signed)
 Ellieanna Funderburg                                          MRN: [id number]   09/15/2024   The [facility] Quality Team Specialist reviewed this patient medical record for the purposes of chart review for care gap closure. The following were reviewed: chart review for care gap closure-kidney health evaluation for diabetes:eGFR  and uACR.    [facility] Quality Team

## 2024-10-12 ENCOUNTER — Telehealth: Payer: Self-pay

## 2024-10-12 NOTE — Telephone Encounter (Signed)
 I spoke with patient and let her know that Dr. Luke Shade will be out of the office on 11/07/2024, so we need to reschedule her appointment.  Patient states she has two more patients to see, so she will call us  back to reschedule.  I let patient know that she will need to ask to speak with someone in our office to assist her with rescheduling when she calls back.  E2C2 - when patient calls back, please transfer call to our office for rescheduling.

## 2024-10-26 ENCOUNTER — Encounter: Payer: Self-pay | Admitting: Internal Medicine

## 2024-10-26 ENCOUNTER — Ambulatory Visit: Attending: Internal Medicine | Admitting: Internal Medicine

## 2024-10-26 VITALS — BP 122/81 | HR 96 | Temp 98.3°F | Resp 16 | Ht 67.75 in | Wt 172.0 lb

## 2024-10-26 DIAGNOSIS — H04202 Unspecified epiphora, left lacrimal gland: Secondary | ICD-10-CM

## 2024-10-26 DIAGNOSIS — R7689 Other specified abnormal immunological findings in serum: Secondary | ICD-10-CM

## 2024-10-26 DIAGNOSIS — R000 Tachycardia, unspecified: Secondary | ICD-10-CM | POA: Diagnosis not present

## 2024-10-26 DIAGNOSIS — J358 Other chronic diseases of tonsils and adenoids: Secondary | ICD-10-CM

## 2024-10-26 DIAGNOSIS — B999 Unspecified infectious disease: Secondary | ICD-10-CM

## 2024-10-26 NOTE — Assessment & Plan Note (Addendum)
 No specific underlying mechanism identified. May be dysautonomia process would be more consistent with noiceptive chronic pain process.

## 2024-10-26 NOTE — Assessment & Plan Note (Addendum)
 Possible related to chronic dryness, no particular Hx of sialadenitis specifically and exam normal currently.

## 2024-10-26 NOTE — Patient Instructions (Signed)
 I recommend symptom treatments for eye dryness including lubricating eye drops and can use gel or ointment based products for overnight.  Also consider use of humidifier at night during dry weather.  Use follow-up regularly with your eye doctor.  If symptoms worsen there are several types of medicated eyedrop that can help with the dryness or inflammation.  For chronic dry mouth is important to stay well-hydrated.  You can also use sugar-free gum or lozenges to stimulate the saliva production.  Biotene mouthwash or lozenges may also be helpful.  Products into XyliMelts also work to stimulate saliva production.  Continue following up with your dentist regularly because dryness problems can increase the risk of tooth and gum decay.  If symptoms get worse there are medications for stimulating tear and saliva production but will try all the other options first.

## 2024-10-26 NOTE — Assessment & Plan Note (Addendum)
 Positive ANA with symptoms suggestive of Sjogren's. Differential includes lupus and other autoimmune conditions. Previous Plaquenil use provided relief. No significant joint inflammation. - Ordered blood tests for Sjogren's and other autoimmune conditions. - Consider referral to allergy immunology if tests inconclusive. - Discuss treatment options, including symptomatic management and hydroxychloroquine, if confirmed.  Orders:   RNP Antibody   Anti-Smith antibody   Sjogrens syndrome-A extractable nuclear antibody   Anti-DNA antibody, double-stranded   C3 and C4   Sedimentation rate   IgG, IgA, IgM

## 2024-10-26 NOTE — Assessment & Plan Note (Addendum)
 Checking immunoglobulin titers for r/o deficiency given frequent infections reported. Orders:   IgG, IgA, IgM

## 2024-10-26 NOTE — Progress Notes (Signed)
 "  Office Visit Note  Patient: Cynthia Burke             Date of Birth: 05-15-1976           MRN: [id number]             PCP: Abbey Bruckner, MD Referring: Malachy Comer GAILS, NP Visit Date: 10/26/2024 Occupation: Data Unavailable  Subjective:  New Patient (Initial Visit) (Patient has notes in her phone. ), Abnormal Lab, and Joint Pain    Discussed the use of AI scribe software for clinical note transcription with the patient, who gave verbal consent to proceed.  History of Present Illness   Cynthia Burke is a 48 year old female with history of Hashimoto's thyroiditis who presents with a positive ANA test and multiple systemic symptoms. She was referred for evaluation due to a positive ANA test and symptoms including skin and joint problems in multiple areas.  She experiences intense pruritus primarily on her left arm, ongoing for several years. The skin is xerotic, and she has used triamcinolone with limited relief. Oral antihistamines have also been ineffective. A punch biopsy previously showed eosinophils and spongiotic dermatitis, but the cause remains uncertain. She has eczema, which differs from her current condition as it is less pruritic and resolves more quickly.  She has recurrent infections following dental procedures requiring antibiotics. She suspects a current dental infection due to tenderness in a previous root canal site.  She was diagnosed with asthma at age 71 and experiences persistent fatigue, significant periorbital dark circles, and weight gain, which she attributes to Hashimoto's disease. Her TSH levels have been stable on thyroid  medication. There is a family history of thyroid  problems and obesity.  She underwent early menopause at age 83 after being found to have elevated FSH levels and a recommendation for donor eggs. She was placed on estrogen therapy, which led to the development of a mass, prompting a hysterectomy while preserving her ovaries.  She reports persistent weight gain since her 30s, initially attributed to Hashimoto's.  She experiences tearing in her left eye, which has been ongoing for some time. She has not seen an eye doctor recently but reports occasional pain behind the eye. She also reports xerostomia, which she suspects may be medication-related, and experiences aphthous ulcers that resolve spontaneously.  She has hypertension and takes Zyrtec for allergies, avoiding decongestants due to blood pressure concerns. She reports joint stiffness without significant swelling or redness and has not required recent antibiotics for infections.  She sleeps adequately but often wakes at night to urinate, which she attributes to her current health status. She works as a adult nurse, which involves frequent hand washing, potentially contributing to her xerotic skin.       Activities of Daily Living:  Patient reports morning stiffness for 15-30 minutes.   Patient Denies nocturnal pain.  Difficulty dressing/grooming: Denies Difficulty climbing stairs: Denies Difficulty getting out of chair: Denies Difficulty using hands for taps, buttons, cutlery, and/or writing: Reports  Review of Systems  Constitutional:  Positive for fatigue.  HENT:  Positive for mouth sores and mouth dryness.   Eyes:  Positive for dryness.  Respiratory:  Positive for shortness of breath.   Cardiovascular:  Negative for chest pain and palpitations.  Gastrointestinal:  Positive for blood in stool, constipation and diarrhea.  Endocrine: Negative for increased urination.  Genitourinary:  Positive for decreased urine output. Negative for involuntary urination.  Musculoskeletal:  Positive for joint pain, gait problem, joint pain, myalgias,  muscle weakness, morning stiffness, muscle tenderness and myalgias. Negative for joint swelling.  Skin:  Positive for color change, rash and sensitivity to sunlight. Negative for hair loss.  Allergic/Immunologic:  Positive for susceptible to infections.  Neurological:  Positive for headaches. Negative for dizziness.  Hematological:  Positive for swollen glands.  Psychiatric/Behavioral:  Positive for depressed mood and sleep disturbance. The patient is nervous/anxious.     PMFS History:  Patient Active Problem List   Diagnosis Date Noted   Mild obstructive sleep apnea 05/27/2024   Excessive daytime sleepiness 02/24/2024   Snoring 02/24/2024   Sleep difficulties 02/01/2024   Autoimmune disorder 02/01/2024   Rash 02/01/2024   Other fatigue 02/01/2024   Colon cancer screening 02/01/2024   Iron deficiency anemia 02/01/2024   Need for influenza vaccination 09/04/2023   Breast cancer screening by mammogram 01/11/2023   Tachycardia, unspecified 10/19/2022   Primary hypertension 07/04/2022   Hyperlipidemia 07/04/2022   Prediabetes 02/05/2022   Stress 10/31/2021   Mood disorder 10/31/2021   Osteopenia 04/12/2020   Acne 03/08/2020   Allergic rhinitis 12/08/2019   Irritable bowel syndrome with constipation 06/03/2019   Annual physical exam 06/03/2019   Overweight (BMI 25.0-29.9) 09/02/2018   ADHD 09/02/2018   Asthma 09/02/2018   Allergies 09/02/2018   Premature ovarian failure 09/02/2018   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  April 2021     Last Assessment & Plan:   Formatting of this note might be different from the original.  Work-up April 2020: normal Lyme/RMSF/ANA/Celiac panel /CK   Asthma    BMI 28.0-28.9,adult 07/04/2022   COVID-19    Depression    Hashimoto's disease    Hashimoto's thyroiditis    History of chicken pox    History of UTI    HLD (hyperlipidemia) 09/02/2018    Hyperlipidemia    Need for hepatitis B screening test 03/26/2020   Premature ovarian insufficiency    FSH 29 Dr. Carroll    Rash 03/08/2020   Seizures (HCC)    medication induced wellbutrin   Thumb pain, left 09/02/2018    Family History  Problem Relation Age of Onset   Rheum arthritis Mother    Hashimoto's thyroiditis Mother    Hypertension Mother    Hyperlipidemia Mother    Arthritis Mother    Asthma Mother    Depression Mother    Kidney disease Mother    Miscarriages / Stillbirths Mother    Memory loss Mother    Diabetes Mellitus II Father    Hypothyroidism Father    Hypertension Father    Coronary artery disease Father    Hyperlipidemia Father    Arthritis Father    COPD Father    Depression Father    Diabetes Father    Heart disease Father    Kidney disease Father    Bladder Cancer Father    Prostate cancer Father    Peripheral Artery Disease Father    Hypertension Brother    Depression Brother    ADD / ADHD Brother    Stroke Maternal Grandmother    Heart disease Maternal Grandmother    Alcohol abuse Maternal Grandfather    Early death Maternal Grandfather  Breast cancer Paternal Grandmother 52   Early death Paternal Grandmother    Cancer Paternal Grandmother        breast died    Miscarriages / Stillbirths Paternal Grandmother    Heart disease Paternal Grandfather    Past Surgical History:  Procedure Laterality Date   ABDOMINAL HYSTERECTOMY     Dr. 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      Immunization History  Administered Date(s) Administered   Influenza, Seasonal, Injecte, Preservative Fre 09/04/2023   Influenza,inj,Quad PF,6+ Mos 10/27/2017, 09/02/2018, 10/31/2021, 07/04/2022   Influenza-Unspecified 10/27/2017, 10/27/2017   Moderna SARS-COV2 Booster Vaccination 06/13/2021   PFIZER(Purple Top)SARS-COV-2 Vaccination 03/03/2020, 03/30/2020   PPD Test 06/26/2021   Td (Adult),5 Lf Tetanus Toxid, Preservative Free 05/26/2016   Tdap 09/02/2018     Objective: Vital Signs: BP 122/81 (BP Location: Left Arm, Patient Position: Sitting, Cuff Size: Normal)   Pulse 96   Temp 98.3 F (36.8 C)   Resp 16   Ht 5' 7.75 (1.721 m)   Wt 172 lb (78 kg) Comment: patient refused weight in office and provided weight verbally  LMP 09/10/2018   BMI 26.35 kg/m    Physical Exam HENT:     Mouth/Throat:     Mouth: Mucous membranes are moist.     Pharynx: Oropharynx is clear.  Eyes:     Conjunctiva/sclera: Conjunctivae normal.  Cardiovascular:     Rate and Rhythm: Normal rate and regular rhythm.  Pulmonary:     Effort: Pulmonary effort is normal.     Breath sounds: Normal breath sounds.  Lymphadenopathy:     Cervical: No cervical adenopathy.  Skin:    General: Skin is warm and dry.     Comments: Left arm erythema Very dry skin on hands Picked at nailbeds  Neurological:     Mental Status: She is alert.  Psychiatric:        Mood and Affect: Mood normal.      Musculoskeletal Exam:  Shoulders full ROM no tenderness or swelling Elbows full ROM no tenderness or swelling Wrists full ROM no tenderness or swelling Fingers full ROM no tenderness or swelling Knees full ROM no tenderness or swelling, patellofemoral crepitus b/l Ankles full ROM no tenderness or swelling   Investigation: No additional findings.  Imaging: No results found.  Recent Labs: Lab Results  Component Value Date   WBC 5.8 07/29/2024   HGB 14.3 07/29/2024   PLT 428 (H) 07/29/2024   NA 136  08/10/2024   K 3.6 08/10/2024   CL 99 08/10/2024   CO2 30 08/10/2024   GLUCOSE 89 08/10/2024   BUN 13 08/10/2024   CREATININE 0.93 08/10/2024   BILITOT 0.4 08/10/2024   ALKPHOS 51 02/11/2024   AST 17 08/10/2024   ALT 14 08/10/2024   PROT 6.6 08/10/2024   ALBUMIN 4.4 02/11/2024   CALCIUM 9.2 08/10/2024   GFRAA >60 02/28/2020   QFTBGOLDPLUS Negative 07/04/2022    Speciality Comments: No  specialty comments available.  Procedures:  No procedures performed Allergies: Amlodipine , Bupropion, Spironolactone , Latex, and Other   Assessment / Plan:     Visit Diagnoses:  Assessment & Plan Positive ANA (antinuclear antibody) Watering of left eye Positive ANA with symptoms suggestive of Sjogren's. Differential includes lupus and other autoimmune conditions. Previous Plaquenil use provided relief. No significant joint inflammation. - Ordered blood tests for Sjogren's and other autoimmune conditions. - Consider referral to allergy immunology if tests inconclusive. - Discuss treatment options, including symptomatic management and hydroxychloroquine, if confirmed.  Orders:   RNP Antibody   Anti-Smith antibody   Sjogrens syndrome-A extractable nuclear antibody   Anti-DNA antibody, double-stranded   C3 and C4   Sedimentation rate   IgG, IgA, IgM  Tachycardia, unspecified No specific underlying mechanism identified. May be dysautonomia process would be more consistent with noiceptive chronic pain process.    Tonsillith Possible related to chronic dryness, no particular Hx of sialadenitis specifically and exam normal currently.    Recurrent infections Checking immunoglobulin titers for r/o deficiency given frequent infections reported. Orders:   IgG, IgA, IgM   Hashimoto's thyroiditis Well-managed with no recent TSH abnormalities. Positive ANA may relate to Hashimoto's. - Continue current thyroid  management with endocrinologist.  Chronic eczema Intermittent itching and rash on  left arm. Limited relief from triamcinolone. Punch biopsy suggested spongy form dermatitis. - Continue triamcinolone as needed. - Consider dermatology referral if symptoms persist or worsen.  Patellofemoral crepitus Noted during examination. No significant pain, symptoms may be influenced by footwear. - Ensure appropriate footwear to minimize symptoms.  Right hallux valgus Causing discomfort. Toe separators and bunion corrector provided relief. - Continue use of toe separators and bunion corrector. - Ensure proper footwear to accommodate foot structure.        Follow-Up Instructions: No follow-ups on file.   Cynthia LELON Ester, MD  Note - This record has been created using [employer].  Chart creation errors have been sought, but may not always  have been located. Such creation errors do not reflect on  the standard of medical care.      [1]  Social History Tobacco Use   Smoking status: Never    Passive exposure: Past   Smokeless tobacco: Never  Vaping Use   Vaping status: Never Used  Substance Use Topics   Alcohol use: No   Drug use: No   "

## 2024-10-27 LAB — IGG, IGA, IGM
IgG (Immunoglobin G), Serum: 1007 mg/dL (ref 600–1640)
IgM, Serum: 97 mg/dL (ref 50–300)
Immunoglobulin A: 148 mg/dL (ref 47–310)

## 2024-10-27 LAB — SEDIMENTATION RATE: Sed Rate: 6 mm/h (ref 0–20)

## 2024-10-27 LAB — RNP ANTIBODY: Ribonucleic Protein(ENA) Antibody, IgG: <1 AI

## 2024-10-27 LAB — ANTI-SMITH ANTIBODY: ENA SM Ab Ser-aCnc: <1 AI

## 2024-10-27 LAB — SJOGRENS SYNDROME-A EXTRACTABLE NUCLEAR ANTIBODY: SSA (Ro) (ENA) Antibody, IgG: <1 AI

## 2024-10-27 LAB — ANTI-DNA ANTIBODY, DOUBLE-STRANDED: ds DNA Ab: <1 [IU]/mL

## 2024-10-27 LAB — C3 AND C4
C3 Complement: 127 mg/dL (ref 83–193)
C4 Complement: 22 mg/dL (ref 15–57)

## 2024-11-07 ENCOUNTER — Ambulatory Visit

## 2024-11-11 DIAGNOSIS — L700 Acne vulgaris: Secondary | ICD-10-CM

## 2024-11-14 ENCOUNTER — Other Ambulatory Visit: Payer: Self-pay

## 2024-11-14 ENCOUNTER — Encounter: Payer: Self-pay | Admitting: Oncology

## 2024-11-15 MED ORDER — SULFACETAMIDE SODIUM (ACNE) 10 % EX LOTN: TOPICAL_LOTION | CUTANEOUS | 3 refills | Status: AC

## 2025-01-27 ENCOUNTER — Other Ambulatory Visit

## 2025-07-28 ENCOUNTER — Ambulatory Visit: Admitting: Oncology

## 2025-07-28 ENCOUNTER — Other Ambulatory Visit
